# Patient Record
Sex: Male | Born: 1935 | Race: White | Hispanic: No | Marital: Married | Smoking: Former smoker
Health system: Southern US, Community
[De-identification: ages and names within clinical notes are randomized; demographics above are authoritative.]

## PROBLEM LIST (undated history)

## (undated) DIAGNOSIS — I420 Dilated cardiomyopathy: Secondary | ICD-10-CM

## (undated) DIAGNOSIS — C801 Malignant (primary) neoplasm, unspecified: Secondary | ICD-10-CM

## (undated) DIAGNOSIS — E039 Hypothyroidism, unspecified: Secondary | ICD-10-CM

## (undated) DIAGNOSIS — I493 Ventricular premature depolarization: Secondary | ICD-10-CM

## (undated) DIAGNOSIS — N200 Calculus of kidney: Secondary | ICD-10-CM

## (undated) DIAGNOSIS — E785 Hyperlipidemia, unspecified: Secondary | ICD-10-CM

## (undated) DIAGNOSIS — Z7901 Long term (current) use of anticoagulants: Secondary | ICD-10-CM

## (undated) DIAGNOSIS — J449 Chronic obstructive pulmonary disease, unspecified: Secondary | ICD-10-CM

## (undated) DIAGNOSIS — I482 Chronic atrial fibrillation, unspecified: Secondary | ICD-10-CM

## (undated) DIAGNOSIS — I509 Heart failure, unspecified: Secondary | ICD-10-CM

## (undated) DIAGNOSIS — R0602 Shortness of breath: Secondary | ICD-10-CM

## (undated) HISTORY — DX: Hyperlipidemia, unspecified: E78.5

## (undated) HISTORY — DX: Dilated cardiomyopathy: I42.0

## (undated) HISTORY — PX: APPENDECTOMY: SHX54

## (undated) HISTORY — PX: TONSILLECTOMY AND ADENOIDECTOMY: SUR1326

## (undated) HISTORY — DX: Chronic atrial fibrillation, unspecified: I48.20

## (undated) HISTORY — DX: Heart failure, unspecified: I50.9

## (undated) HISTORY — DX: Ventricular premature depolarization: I49.3

## (undated) HISTORY — DX: Chronic obstructive pulmonary disease, unspecified: J44.9

## (undated) HISTORY — DX: Long term (current) use of anticoagulants: Z79.01

## (undated) HISTORY — DX: Calculus of kidney: N20.0

## (undated) HISTORY — PX: MICROLARYNGOSCOPY WITH CO2 LASER AND EXCISION OF VOCAL CORD LESION: SHX5970

## (undated) HISTORY — DX: Hypothyroidism, unspecified: E03.9

## (undated) HISTORY — PX: LITHOTRIPSY: SUR834

---

## 2001-08-24 ENCOUNTER — Inpatient Hospital Stay (HOSPITAL_COMMUNITY): Admission: EM | Admit: 2001-08-24 | Discharge: 2001-08-31 | Payer: Self-pay | Admitting: Emergency Medicine

## 2001-08-24 ENCOUNTER — Encounter: Payer: Self-pay | Admitting: Emergency Medicine

## 2001-08-26 HISTORY — PX: TRANSTHORACIC ECHOCARDIOGRAM: SHX275

## 2001-10-04 ENCOUNTER — Ambulatory Visit (HOSPITAL_COMMUNITY): Admission: RE | Admit: 2001-10-04 | Discharge: 2001-10-04 | Payer: Self-pay | Admitting: Family Medicine

## 2001-10-04 ENCOUNTER — Encounter: Admission: RE | Admit: 2001-10-04 | Discharge: 2001-10-04 | Payer: Self-pay | Admitting: Family Medicine

## 2001-10-04 ENCOUNTER — Encounter: Payer: Self-pay | Admitting: Family Medicine

## 2003-05-23 ENCOUNTER — Emergency Department (HOSPITAL_COMMUNITY): Admission: EM | Admit: 2003-05-23 | Discharge: 2003-05-23 | Payer: Self-pay | Admitting: Emergency Medicine

## 2003-06-06 ENCOUNTER — Emergency Department (HOSPITAL_COMMUNITY): Admission: EM | Admit: 2003-06-06 | Discharge: 2003-06-07 | Payer: Self-pay | Admitting: Emergency Medicine

## 2003-06-07 ENCOUNTER — Encounter: Payer: Self-pay | Admitting: Emergency Medicine

## 2003-09-28 ENCOUNTER — Encounter: Admission: RE | Admit: 2003-09-28 | Discharge: 2003-09-28 | Payer: Self-pay | Admitting: *Deleted

## 2003-09-29 ENCOUNTER — Ambulatory Visit (HOSPITAL_BASED_OUTPATIENT_CLINIC_OR_DEPARTMENT_OTHER): Admission: RE | Admit: 2003-09-29 | Discharge: 2003-09-29 | Payer: Self-pay | Admitting: *Deleted

## 2003-09-29 ENCOUNTER — Ambulatory Visit (HOSPITAL_COMMUNITY): Admission: RE | Admit: 2003-09-29 | Discharge: 2003-09-29 | Payer: Self-pay | Admitting: *Deleted

## 2003-09-29 ENCOUNTER — Encounter (INDEPENDENT_AMBULATORY_CARE_PROVIDER_SITE_OTHER): Payer: Self-pay | Admitting: *Deleted

## 2003-10-27 ENCOUNTER — Ambulatory Visit: Admission: RE | Admit: 2003-10-27 | Discharge: 2003-12-19 | Payer: Self-pay | Admitting: Radiation Oncology

## 2004-01-08 ENCOUNTER — Ambulatory Visit: Admission: RE | Admit: 2004-01-08 | Discharge: 2004-01-08 | Payer: Self-pay | Admitting: Radiation Oncology

## 2004-03-12 ENCOUNTER — Ambulatory Visit: Admission: RE | Admit: 2004-03-12 | Discharge: 2004-03-12 | Payer: Self-pay | Admitting: Radiation Oncology

## 2004-07-16 ENCOUNTER — Ambulatory Visit: Admission: RE | Admit: 2004-07-16 | Discharge: 2004-07-16 | Payer: Self-pay | Admitting: Radiation Oncology

## 2004-10-15 ENCOUNTER — Ambulatory Visit: Admission: RE | Admit: 2004-10-15 | Discharge: 2004-10-15 | Payer: Self-pay

## 2005-01-14 ENCOUNTER — Ambulatory Visit: Admission: RE | Admit: 2005-01-14 | Discharge: 2005-01-14 | Payer: Self-pay | Admitting: Radiation Oncology

## 2005-11-16 ENCOUNTER — Inpatient Hospital Stay (HOSPITAL_COMMUNITY): Admission: EM | Admit: 2005-11-16 | Discharge: 2005-11-18 | Payer: Self-pay | Admitting: Emergency Medicine

## 2005-11-17 ENCOUNTER — Ambulatory Visit: Payer: Self-pay | Admitting: Family Medicine

## 2006-03-18 ENCOUNTER — Ambulatory Visit (HOSPITAL_COMMUNITY): Admission: RE | Admit: 2006-03-18 | Discharge: 2006-03-18 | Payer: Self-pay | Admitting: Radiation Oncology

## 2007-02-16 HISTORY — PX: US ECHOCARDIOGRAPHY: HXRAD669

## 2007-02-18 HISTORY — PX: CARDIOVASCULAR STRESS TEST: SHX262

## 2007-02-26 ENCOUNTER — Inpatient Hospital Stay (HOSPITAL_BASED_OUTPATIENT_CLINIC_OR_DEPARTMENT_OTHER): Admission: RE | Admit: 2007-02-26 | Discharge: 2007-02-26 | Payer: Self-pay | Admitting: Cardiology

## 2007-02-26 HISTORY — PX: CARDIAC CATHETERIZATION: SHX172

## 2010-05-27 ENCOUNTER — Ambulatory Visit: Payer: Self-pay | Admitting: Cardiovascular Disease

## 2010-07-01 ENCOUNTER — Ambulatory Visit: Payer: Self-pay | Admitting: Cardiology

## 2010-07-08 ENCOUNTER — Ambulatory Visit: Payer: Self-pay | Admitting: Cardiology

## 2010-07-08 ENCOUNTER — Ambulatory Visit (HOSPITAL_COMMUNITY): Admission: RE | Admit: 2010-07-08 | Discharge: 2010-07-08 | Payer: Self-pay | Admitting: Cardiology

## 2010-07-08 HISTORY — PX: US ECHOCARDIOGRAPHY: HXRAD669

## 2010-07-29 ENCOUNTER — Ambulatory Visit: Payer: Self-pay | Admitting: Cardiology

## 2010-08-26 ENCOUNTER — Ambulatory Visit: Payer: Self-pay | Admitting: Cardiology

## 2010-09-23 ENCOUNTER — Ambulatory Visit: Payer: Self-pay | Admitting: Cardiology

## 2010-10-24 ENCOUNTER — Ambulatory Visit: Payer: Self-pay | Admitting: Cardiovascular Disease

## 2010-11-25 ENCOUNTER — Encounter (INDEPENDENT_AMBULATORY_CARE_PROVIDER_SITE_OTHER): Payer: Medicare Other

## 2010-11-25 DIAGNOSIS — I4891 Unspecified atrial fibrillation: Secondary | ICD-10-CM

## 2010-11-25 DIAGNOSIS — Z7901 Long term (current) use of anticoagulants: Secondary | ICD-10-CM

## 2010-12-23 ENCOUNTER — Other Ambulatory Visit (INDEPENDENT_AMBULATORY_CARE_PROVIDER_SITE_OTHER): Payer: Medicare Other

## 2010-12-23 DIAGNOSIS — Z7901 Long term (current) use of anticoagulants: Secondary | ICD-10-CM

## 2010-12-23 DIAGNOSIS — I4891 Unspecified atrial fibrillation: Secondary | ICD-10-CM

## 2011-01-06 ENCOUNTER — Ambulatory Visit (INDEPENDENT_AMBULATORY_CARE_PROVIDER_SITE_OTHER): Payer: Medicare Other | Admitting: Cardiology

## 2011-01-06 DIAGNOSIS — I428 Other cardiomyopathies: Secondary | ICD-10-CM

## 2011-01-06 DIAGNOSIS — E7800 Pure hypercholesterolemia, unspecified: Secondary | ICD-10-CM

## 2011-01-06 DIAGNOSIS — I4891 Unspecified atrial fibrillation: Secondary | ICD-10-CM

## 2011-01-06 DIAGNOSIS — I4949 Other premature depolarization: Secondary | ICD-10-CM

## 2011-01-09 ENCOUNTER — Telehealth: Payer: Self-pay | Admitting: *Deleted

## 2011-01-09 NOTE — Telephone Encounter (Signed)
Notified of lab results;faxed copy to Dr. Jeannetta Nap

## 2011-01-27 ENCOUNTER — Ambulatory Visit (INDEPENDENT_AMBULATORY_CARE_PROVIDER_SITE_OTHER): Payer: Medicare Other | Admitting: *Deleted

## 2011-01-27 DIAGNOSIS — Z7901 Long term (current) use of anticoagulants: Secondary | ICD-10-CM

## 2011-01-27 DIAGNOSIS — I4891 Unspecified atrial fibrillation: Secondary | ICD-10-CM | POA: Insufficient documentation

## 2011-02-18 ENCOUNTER — Other Ambulatory Visit: Payer: Self-pay | Admitting: Cardiology

## 2011-02-18 NOTE — Telephone Encounter (Signed)
Medication refill

## 2011-02-24 ENCOUNTER — Encounter: Payer: Medicare Other | Admitting: *Deleted

## 2011-02-24 ENCOUNTER — Ambulatory Visit (INDEPENDENT_AMBULATORY_CARE_PROVIDER_SITE_OTHER): Payer: Medicare Other | Admitting: *Deleted

## 2011-02-24 DIAGNOSIS — I4891 Unspecified atrial fibrillation: Secondary | ICD-10-CM

## 2011-02-27 ENCOUNTER — Other Ambulatory Visit: Payer: Self-pay | Admitting: Cardiology

## 2011-02-27 NOTE — Telephone Encounter (Signed)
escribe medication per fax request  

## 2011-03-04 NOTE — Cardiovascular Report (Signed)
NAME:  Logan, [name] NO.:  [id number]   MEDICAL RECORD NO.:  [id number]          PATIENT TYPE:  OIB   LOCATION:  1967                         FACILITY:  [facility]   PHYSICIAN:  Logan [name], M.D.  DATE OF BIRTH:  1924-12-29   DATE OF PROCEDURE:  02/26/2007  DATE OF DISCHARGE:                            CARDIAC CATHETERIZATION   INDICATIONS FOR PROCEDURE:  An 77 year old white male with history of  atrial fibrillation, frequent PVCs, and increased dyspnea.  He had a  Cardiolite study which indicated an inferior wall scar with moderate  left ventricular dysfunction.  Procedure was indicated to evaluate for  potential for ischemic heart disease.   PROCEDURES:  1. Left heart catheterization.  2. Coronary and left ventricular angiography.   ACCESS:  Via right femoral artery using standard Seldinger technique.   MEDICATIONS:  Local anesthesia 1% Xylocaine.   EQUIPMENT:  A 4-French 4-cm left Judkins catheter, a 4-French 3-D RC  catheter, a 4-French pigtail catheter, a 4-French arterial sheath   CONTRAST:  Omnipaque 90 mL.   HEMODYNAMIC DATA:  Aortic pressures 103/66 with a mean of 83, left  ventricular pressures 103, with an EDP of approximately 15-mmHg.   ANGIOGRAPHIC DATA:  The left coronary artery arises and distributes  normally.  The left main coronary artery is normal.   The left anterior descending artery has minor wall irregularities less  than or equal to 10%.   The left circumflex coronary artery has minor irregularities less than  10-15%.   The right coronary artery is a large dominant vessel.  It has a focal  20% stenosis in the mid vessel.  Otherwise, no significant disease.   LEFT VENTRICULAR ANGIOGRAPHY:  Was performed in RAO view.  This  demonstrates mild left ventricular enlargement with global hypokinesia  and overall moderate left ventricular dysfunction.  Ejection fraction is  estimated at 35-40%.  There is minimal mitral  insufficiency.   FINAL INTERPRETATION:  1. Minimal nonobstructive atherosclerotic coronary artery disease.  2. Moderate left ventricular dysfunction.   PLAN:  Recommend continued medical therapy.           ______________________________  Logan [name], M.D.     PMJ/MEDQ  D:  02/26/2007  T:  02/26/2007  Job:  [id number]   cc:   Logan [name], M.D.

## 2011-03-04 NOTE — H&P (Signed)
NAME:  Logan [name], BEIGEL NO.:  [id number]   MEDICAL RECORD NO.:  [id number]          PATIENT TYPE:  EMS   LOCATION:  MINO                         FACILITY:  [facility]   PHYSICIAN:  Peter M. [place], M.D.  DATE OF BIRTH:  02/24/1925   DATE OF ADMISSION:  02/26/2007  DATE OF DISCHARGE:                              HISTORY & PHYSICAL   HISTORY OF PRESENT ILLNESS:  Mr. Hartsell is a pleasant 77 year old white  male.  He has a history of dilated cardiomyopathy and atrial  fibrillation.  He has been on chronic anticoagulation therapy.  Recently, he presented with symptoms of increasing shortness of breath  on exertion relieved with rest.  He denied any true chest pain,  palpitations or dizziness.  We obtained an echocardiogram which  demonstrated an ejection fraction of 30 to 35%.  He had mild concentric  LVH.  There was global hypokinesia and mild biatrial enlargement.  There  was no significant valvular abnormality.  We subsequently did an  adenosine-Cardiolite study.  This demonstrated ejection fraction of 40%  with global hypokinesia.  He had a fixed inferior wall defect consistent  with prior myocardial infarction.  Given the concern over ischemic heart  disease, he is now admitted for cardiac catheterization.   PAST MEDICAL HISTORY:  1. Atrial fibrillation.  2. Dilated cardiomyopathy.  3. Chronic PVCs.  4. Chronic anticoagulation.  5. Hypothyroidism.  6. Hypercholesterolemia.  7. History of congestive heart failure due to systolic dysfunction.  8. The patient also has a remote history of kidney stones.  9. He has had prior appendectomy and T&A.   ALLERGIES:  HE HAS NO KNOWN ALLERGIES.   CURRENT MEDICATIONS INCLUDE:  1. Coumadin 4.5 mg 6 days a week, 3 mg 1 day a week.  2. Lasix 20 mg per day.  3. Levothyroxine 75 mcg per day.  4. Diovan 160 mg per day.  5. Toprol XL 50 mg per day.  6. Simvastatin 80 mg per day.   SOCIAL HISTORY:  The patient is a retired  Personnel officer.  He is married.  He has no children.  He denies tobacco or alcohol use.   FAMILY HISTORY:  Father died of myocardial infarction and congestive  heart failure in his 59s.  Mother died of a stroke and seizure at age  26.  One brother died of myocardial infarction age 54.   REVIEW OF SYSTEMS:  Otherwise unremarkable.   PHYSICAL EXAMINATION:  GENERAL:  The patient is a pleasant white male in  no apparent distress.  VITAL SIGNS:  His weight is 156, blood pressure is 112/70, pulse is 104  and irregular.  HEENT:  He is normocephalic, atraumatic.  Pupils equal, round, reactive  to light and accommodation.  Oropharynx is clear.  Neck is supple  without JVD, adenopathy, thyromegaly or bruits.  LUNGS: Clear to auscultation and percussion.  CARDIAC:  Exam is without gallop, murmur, rub, or click.  ABDOMEN:  Soft and nontender.  EXTREMITIES:  He has no lower extremity edema.   LABORATORY DATA:  His BUN is 14, creatinine 0.9, electrolytes are  normal.  CBC is  normal.   IMPRESSION:  1. Dilated cardiomyopathy with chronic systolic congestive heart      failure.  2. Abnormal adenosine-Cardiolite study indicating prior inferior      myocardial infarction.  3. Chronic atrial fibrillation.  4. Anticoagulation.  5. Hypertension.  6. Hyperlipidemia.   PLAN:  We will proceed with diagnostic heart catheterization.           ______________________________  Peter M. [place], M.D.     PMJ/MEDQ  D:  02/25/2007  T:  02/25/2007  Job:  [id number]   cc:   Windle Guard, M.D.

## 2011-03-06 ENCOUNTER — Other Ambulatory Visit: Payer: Self-pay | Admitting: Cardiology

## 2011-03-06 NOTE — Telephone Encounter (Signed)
escribe medication per fax request  

## 2011-03-07 NOTE — Discharge Summary (Signed)
[place]. [facility]  Patient:    Logan [name], ETSITTY Visit Number: [id number] MRN: [id number]          Service Type: MED Location: [phone] Attending Physician:  [place], Peter Manning Dictated by:   Peter M. [place], M.D. Admit Date:  08/24/2001 Discharge Date: 08/31/2001   CC:         Buren Kos, M.D.   Discharge Summary  HISTORY OF PRESENT ILLNESS:  The patient is a 77 year old white male previously in good health who presented with a two-week history of worsening shortness of breath on exertion.  On presentation he was found to be in atrial fibrillation with a rapid ventricular response.  The patient did have a remote history of pneumonia.  Also had a remote history of heavy tobacco use.  The patient was admitted for further evaluation and management.  For details of his past medical history, social history, family history, and physical exam please see admission history and physical.  LABORATORY DATA:  pH 7.40, pCO2 36, pO2 76, bicarb of 23 on room air.  White count 8800, hemoglobin 13.9, hematocrit 40.4, platelet count 267,000, normal differential.  PT is 14.2 with an INR of 1.1, PTT 32.  Sodium 138, potassium 4.4, chloride 110, CO2 23, glucose 118, BUN 14, creatinine 0.8.  Other chemistries were normal including hepatic function profile.  CK was 35 with negative MB, troponin 0.01.  In fact, serial cardiac enzymes were normal. Lipid panel showed a total cholesterol of 191, HDL 41, LDL 134, triglycerides of 80.  TSH was 1.446.  Chest x-ray showed changes of COPD, heart size was enlarged, and there was mild bibasilar edema and effusions.  ECG showed atrial fibrillation with rapid ventricular response.  There was low voltage otherwise no acute changes.  HOSPITAL COURSE:  The patient was admitted.  He was heparinized.  Rate controls obtained initially with IV Cardizem but then the patient was transitioned to an oral beta blocker of Toprol XL  which was increased to 100 mg per day.  This yielded adequate rate control with heart rates consistently under 100.  The patient was also started on Coumadin.  He was gently diuresed with improvement in his dyspnea.  Echocardiogram was obtained and was consistent with dilated cardiomyopathy.  There was mild biatrial enlargement, severe global hypokinesia with ejection fraction estimated at 25-30%.  There was mild mitral and tricuspid insufficiency.  In addition, the patient was started on ACE inhibitor.  He was initiated on amiodarone beginning at 400 mg b.i.d.  He was monitored in the hospital for drug initiation.  He tolerated this well over his four days of observation while on amiodarone.  Baseline pulmonary function studies were performed and demonstrate severe obstructive pulmonary disease.  FEV-1 was 0.92 L which was 36% of predicted with an FEV-1 to FVC ratio of 34 which was 52% of predicted. Diffusion capacity was moderately depressed.  With adequate rate control, the patient had his Coumadin adjusted to therapeutic levels at which point his heparin was discontinued.  He had good compensation of his congestive heart failure.  He was discharged home in stable condition on continued amiodarone therapy.  The plan would be to have the patient remain in atrial fibrillation to perform an outpatient elective cardioversion in approximately three weeks.  DISCHARGE DIAGNOSES: 1. Atrial fibrillation/rapid ventricular response. 2. Congestive heart failure. 3. Dilated cardiomyopathy. 4. Severe chronic obstructive pulmonary disease. 5. History of pneumonia.  DISCHARGE MEDICATIONS: 1. Amiodarone 200 mg two tablets twice  a day for approximately two to    three weeks and then we will reduce to 400 mg per day. 2. Coumadin 3 mg daily. 3. Lasix 20 mg per day. 4. Altace 5 mg daily. 5. Toprol XL 100 mg per day.  ACTIVITY:  The patient is to advance his activity as tolerated.  DIET:  He is  instructed low salt, low fat diet and avoidance of foods rich in vitamin K.  FOLLOWUP:  The patient will return Friday, September 03, 2001 for a prothrombin time.  He will see Dr. Swaziland for office visit 10 days.  DISCHARGE STATUS:  Improved. Dictated by:   Peter M. [place], M.D. Attending Physician:  [place], Peter Manning DD:  08/31/01 TD:  08/31/01 Job: [id number]

## 2011-03-07 NOTE — H&P (Signed)
NAME:  Logan [name], Logan [name] NO.:  [id number]   MEDICAL RECORD NO.:  [id number]          PATIENT TYPE:  INP   LOCATION:  1830                         FACILITY:  [facility]   PHYSICIAN:  Pearlean Brownie, M.D.DATE OF BIRTH:  March 25, 1925   DATE OF ADMISSION:  11/16/2005  DATE OF DISCHARGE:                                HISTORY & PHYSICAL   PRIMARY CARE PHYSICIAN:  Windle Guard, M.D.  Cardiologist, Dr. [place].   CHIEF COMPLAINT:  Vomiting and diarrhea.   HISTORY OF PRESENT ILLNESS:  Mr. Mounts is a 77 year old male with past  medical history significant for vocal cord cancer, atrial fibrillation, CHF  with ejection fraction 25-30%, COPD and hypothyroidism who presents with a 3-  day history of nonbloody, nonbilious vomiting.  The patient states he has  not been able to keep anything down.  He also has had watery diarrhea  several times a day during this same time frame.  He has had mild mid  epigastric pain which began after the vomiting started.  He also has noted  occasional subjective fevers.  He denies any recent antibiotic use.  He has  no sick contacts, although his wife developed diarrhea and vomiting 2 days  after his illness began.  In regards to the patient's COPD, he has a chronic  cough, but denies any recent changes in his dyspnea or sputum production.  He usually is able to ambulate without dyspnea, but gets short of breath  with any further exertion secondary to CHF.  The patient also has felt  increasingly weak as the illness has progressed.   REVIEW OF SYSTEMS:  CARDIOPULMONARY:  No chest pain.  HEENT:  Slight  headache, no vision changes, no lightheadedness. no dizziness.  GENITOURINARY:  No dysuria.  Remainder as per HPI.   PAST MEDICAL HISTORY:  1.  Vocal cord cancer, status post surgery and radiation.  2.  Congestive heart failure.  3.  Dilated cardiomyopathy with ejection fraction 25-30% in 2002.  4.  COPD.  5.  Kidney stones.  6.  Atrial  fibrillation.  7.  Hypothyroidism.  8.  Status post appendectomy.  9.  Status post T&A.   MEDICATIONS:  1.  Amiodarone 100 mg p.o. daily.  2.  Warfarin 3 mg on Monday, Wednesday and Friday; 4.5 mg on other days of      the week.  3.  Lasix 20 mg p.o. daily.  4.  Diovan 160 mg p.o. b.i.d.  5.  Levothyroxine 75 mcg p.o. daily.   ALLERGIES:  No known drug allergies.   FAMILY HISTORY:  Dad died of a heart attack and CHF when he was in his 51s.  Mom died of CVA and seizure at age 76.  Brother died of MI at age 64.  He  does have one sister in good health.  He has no children.   SOCIAL HISTORY:  He is a 20- to 30-pack-year smoker, although he quit more  than 20 years ago.  No alcohol use.  No illicit drug use.  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   PHYSICAL EXAMINATION:  VITAL SIGNS:  Temperature 98.3, pulse 68, blood  pressure 94/53, respirations 24.  He is 99% on 2 L, 96% on room air.  GENERAL:  He is resting in no acute distress.  HEENT:  Pupils equal round and reactive to light and accommodation.  Sclerae  anicteric.  Extraocular movements intact.  Mucous membranes are dry.  Throat  is without erythema or exudate.  Tympanic membranes are clear bilaterally.  NECK:  No carotid bruits or thyromegaly.  LUNGS:  Diffuse expiratory wheezes.  He has decreased breath sound  bilaterally and he has positive crackles over his left mid lung field.  CARDIAC:  Distant heart sounds, but he has regular rate and rhythm.  ABDOMEN:  Hyperactive bowel sounds.  Voluntary guarding in the mid  epigastric region.  No rebound tenderness and no hepatosplenomegaly.  EXTREMITIES:  2+ radial and dorsalis pedis pulses bilaterally.  No clubbing,  cyanosis or edema.  NEUROLOGIC:  He is alert and oriented x3.  Cranial nerves 2-12 grossly  intact.  He has 5/5 motor strength and 2+ DTRs which are bilaterally  symmetric.  Finger-to-nose was intact.  His gait was not tested.  SKIN:  He has poor skin  turgor, but no rashes.  RECTAL:  Good sphincter tone.  Prostate is mildly enlarged.  Fecal occult  blood negative.   LABORATORY DATA AND X-RAY FINDINGS:  White blood cell count 11.6 with 88%  neutrophils, hemoglobin 13.5, hematocrit 39.3, platelet count 275.  Sodium  136, potassium 3.7, chloride 104, bicarb 21, BUN 55, creatinine 1.9, glucose  134.  Total protein 6.0, albumin 3.0, amylase 62, lipase 20, AST 34, ALT 18,  Alk phos 64, total bilirubin 0.9.  Urinalysis with specific gravity 1.024,  negative ketones, negative bilirubin, negative hemoglobin, negative nitrite,  negative leukocyte esterase, 0-2 wbc's and 0-2 rbc's.  PT was 33.2, INR 3.3.   Chest x-ray showed COPD as well as probable acute pneumonia, left mid lung,  as well as a nodular density in right lung base that needs follow up.  Abdominal x-ray showed mild ileus without obstruction.   ASSESSMENT/PLAN:  1.  He is a 76 year old male with vomiting and diarrhea.  Likely viral      gastroenteritis given his history and his wife with similar illness.      Will check an electrocardiogram to make sure there is no inferior wall      myocardial infarction given his age.  Will give Protonix 40 mg      intravenous daily as well as Phenergan 25 mg intravenous q.6h. p.r.n. if      his vomiting continues.  2.  Left middle lobe pneumonia, community-acquired.  Will start ceftriaxone      and azithromycin to cover Streptococcus pneumonia and atypicals.  3.  Chronic obstructive pulmonary disease.  Will start Xopenex nebulizer      treatment.  The patient is not on any chronic obstructive pulmonary      disease medications at home and would recommend to follow up as      outpatient so he can be put on regular chronic obstructive pulmonary      disease medications such as Spiriva.  4.  Acute renal failure likely secondary to vomiting and dehydration.  We     will check a FENA.  Will follow his creatinine.  He is without urinary      tract  infection or obstruction.  Will hold his Diovan and Lasix for now      until  his renal function improves.  The patient denies any nonsteroidal      anti-inflammatory drug use.  5.  Dehydration/fluids, electrolytes.  The patient received 500 mL bolus in      the emergency room.  Will give another 250 mL bolus of normal saline and      then will run intravenous fluids at 7500 mL an hour.  Will watch his      urine output closely as well as his lung exam given his history of      congestive heart failure.  6.  Atrial fibrillation.  The patient is rate-controlled with his heart rate      currently in the 60s.  We will hold his amiodarone until he is taking      better oral intake.  The patient's international normalized ratio was      elevated at 3.3, so we will hold his Coumadin today and then pharmacy      will dose his Coumadin.  7.  Hypotension.  This is likely secondary to vomiting and dehydration.  We      will rehydrate.  Orthostatic vital signs were still pending at the time      of this dictation, but we will follow up on that.  8.  Solitary pulmonary nodule.  The patient will need a followup computed      tomography or chest x-ray as an outpatient.  The patient with increased      age and smoking history which does put him at increased risk of lung      cancer.  9.  Code status.  The patient is a full code.      Benn Moulder, M.D.    ______________________________  Pearlean Brownie, M.D.    MR/MEDQ  D:  11/16/2005  T:  11/16/2005  Job:  [id number]

## 2011-03-07 NOTE — Op Note (Signed)
NAME:  FORBES, LOLL NO.:  [id number]   MEDICAL RECORD NO.:  [id number]                   PATIENT TYPE:  AMB   LOCATION:  DSC                                  FACILITY:  [facility]   PHYSICIAN:  Kathy Breach, M.D.                   DATE OF BIRTH:  06/14/1925   DATE OF PROCEDURE:  09/29/2003  DATE OF DISCHARGE:                                 OPERATIVE REPORT   PREOPERATIVE DIAGNOSIS:  Right vocal cord lesion, rule out carcinoma.   PROCEDURE:  Micro direct laryngoscopy with complete stripping of right vocal  cord lesion, suspect for carcinoma.   POSTOPERATIVE DIAGNOSIS:  Pending histological confirmation.   DESCRIPTION OF PROCEDURE:  With the patient under general orotracheal  anesthesia, the laryngoscope was introduced.  Both valleculae were clear.  Epiglottis was normal.  Both piriform sinuses were clear.  The anterior  larynx was normal.  Aside from lesion involving the right true vocal cord  from just anterior to the vocal process and all the way to the anterior  commissure, there was a mucosally covered mass cratered on its medial  surface, but not rightly ulcerated.  It was firm to touch.  Perioperatively,  mirror examination and fiberoptic examination, the patient demonstrated  normal mobility of this right vocal cord.  Under microscopic visualization,  preprocedure photo was obtained of the lesion.  The lesion was stripped away  as completely as possible.  This appeared to be mainly attached on the true  vocal cord and onto the undersurface extending 3 or 4 mm underside of the  right true vocal cord.  As stated, the lesion removed and as completely as  possible.  It came away with difficulty.  All suggestions clinically of  being invasive carcinoma.  The patient tolerated the procedure well and was  taken to the recovery room in stable general condition.                                               Kathy Breach, M.D.    Venia Minks  D:   09/29/2003  T:  09/30/2003  Job:  [id number]

## 2011-03-07 NOTE — H&P (Signed)
[place]. [facility]  Patient:    Logan [name], Logan [name] Visit Number: [id number] MRN: [id number]          Service Type: MED Location: [phone] Attending Physician:  [place], Peter Manning Dictated by:   Peter M. [place], M.D. Admit Date:  08/24/2001   CC:         Windle Guard, M.D.   History and Physical  HISTORY OF PRESENT ILLNESS:  [name] is a 77 year old white male, previously in good health, who presents with a two-week history of worsening shortness of breath on exertion.  He has had no chest pain, no orthopnea, PND, or edema.  He has had no cough or fever.  He denies any palpitations or dizziness.  The patient states this [place].  Now he is at the point where he can no longer walk out to his road without having to stop twice due to shortness of breath.  On arrival to the emergency room the patient was found to be in atrial fibrillation with a rapid ventricular response.  PAST MEDICAL HISTORY: 1. Pneumonia x 2. 2. Previous kidney stones, with surgery in 1967. 3. Prior appendectomy. 4. Prior T&A.  MEDICATIONS:  None.  ALLERGIES:  No known allergies.  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  He has no children.  He is a nonsmoker and does not drink alcohol.  FAMILY HISTORY:  Father died of myocardial infarction and congestive heart failure in his 36s.  Mother died of CVA and seizure at age 59.  He has a brother who died of myocardial infarction at age 13.  REVIEW OF SYSTEMS:  The patient did have an irregular pulse noted five to six years ago, where ECG just showed "skipped beats."  He has had recent fatigue and some intermittent right upper quadrant discomfort.  All other review of systems are negative.  PHYSICAL EXAMINATION:  GENERAL:  Elderly white male in no apparent distress.  VITAL SIGNS:  Blood pressure 150/100, pulse 140 and irregular,  respirations 20, afebrile.  HEENT:  Normocephalic, atraumatic.  Pupils are equal, round, and reactive to light and accommodation.  Fundi are benign.  Oropharynx is clear.  NECK:  Supple.  Without JVD, adenopathy, thyromegaly, or bruits.  LUNGS:  Clear to auscultation and percussion.  CARDIAC:  Irregular rate and rhythm, without gallops, murmurs, rubs, or clicks.  ABDOMEN:  Soft and nontender.  No hepatosplenomegaly, masses, or bruits.  EXTREMITIES:  Femoral and pedal pulses are 2+ and symmetric.  He has no edema or cyanosis.  NEUROLOGIC:  Alert and oriented x 4.  Cranial nerves II-XII are intact grossly.  Motor and sensory exam are normal.  Mood is appropriate.  LABORATORY DATA:  ECG shows atrial fibrillation with rapid ventricular response and frequent PVCs.  There are nonspecific T-wave abnormalities.  Chest x-ray shows cardiomegaly, small right pleural effusion.  Question early CHF.  White count 8800, hemoglobin 13.9, hematocrit 40.4, platelets 267,000.  Sodium 138, potassium 4.4, chloride 110, CO2 23, BUN 14, creatinine 0.8, glucose 118. LFTs are normal.  Troponin is 0.01, CK 35, negative MB.  IMPRESSION:  1. Atrial fibrillation with rapid ventricular response, new onset. 2. Mild CHF secondary to #1. 3. History of pneumonia.  PLAN:  The patient will be admitted to telemetry.  Rule out myocardial infarction.  Obtain TSH, lipid panel, and echocardiogram.  Treat with IV heparin, IV Cardizem for rate  control.  Will also start him on aspirin and beta blocker and gently diurese. Dictated by:   Peter M. [place], M.D. Attending Physician:  [place], Peter Manning DD:  08/24/01 TD:  08/25/01 Job: [id number]

## 2011-03-07 NOTE — Discharge Summary (Signed)
NAME:  Logan [name], Logan [name] NO.:  [id number]   MEDICAL RECORD NO.:  [id number]          PATIENT TYPE:  INP   LOCATION:  6705                         FACILITY:  [facility]   PHYSICIAN:  Benn Moulder, M.D.      DATE OF BIRTH:  February 08, 1925   DATE OF ADMISSION:  11/16/2005  DATE OF DISCHARGE:  11/18/2005                           DISCHARGE SUMMARY - REFERRING   SPECIAL INSTRUCTIONS:  Please send a STAT discharge summary to Dr. Jeannetta Nap at  fax number [phone], as the patient has an appointment tomorrow morning at  11:00 a.m. and I would like for Dr. Jeannetta Nap to have a copy of this discharge  summary for that appointment.   DISCHARGE DIAGNOSES:  1.  Viral gastroenteritis.  2.  Community acquired pneumonia.  3.  Solitary pulmonary nodule.  4.  Congestive heart failure.  5.  Chronic obstructive pulmonary disease.  6.  Acute renal failure.  7.  Hypothyroidism.  8.  Bradycardia.   DISCHARGE MEDICATIONS:  1.  Augmentin 875 mg p.o. b.i.d. x7 days.  2.  Lasix 20 mg p.o. daily.  3.  Levothyroxine 75 mcg p.o. daily.  4.  Coumadin. The patient will resume once INR normalizes.  5.  Albuterol MDI 1 to 2 puffs q.4 hours p.r.n.  6.  Diovan 160 mg p.o. b.i.d. Will be held until after the patient has a CT      scan as an outpatient.  7.  Amiodarone was discontinued per cardiology.   PROCEDURES:  1.  A chest x-ray on November 16, 2005 showed COPD with acute mid left lung      pneumonia. Additional nodular density of the right lung base, which      required bi-lobe imaging, was also noted.  2.  Abdominal x-ray on November 16, 2005 showed a mild ileus pattern without      obstruction.  3.  EKG showed sinus bradycardia and a pattern of ventricular bigeminy.   LABORATORY DATA:  On admission white blood cell count 11.6. Hemoglobin and  hematocrit 13.5 and 39.3. Platelets 275,000. Sodium 136, potassium 3.4,  chloride 104, bicarb 21, BUN elevated at 59, creatinine elevated at 1.9.  Glucose 134.  LFT's were within normal limits. Urinalysis was within normal  limits.   Laboratory data during the hospital admission, cardiac enzymes were negative  x1.   Laboratory data at discharge, creatinine 0.9. The patient's INR was 4.7.   HISTORY OF PRESENT ILLNESS:  The patient is a 77 year old male with past  medical history significant for vocal cord cancer, atrial fibrillation,  congestive heart failure with an ejection fraction of 25% to 30%, COPD, and  hypothyroidism, who presented with a 3 day history of non-bloody, non-  bilious vomiting. He was unable to keep anything down and was admitted for  vomiting and dehydration. The patient was also noted to have a left mid lung  pneumonia on chest x-ray while down in the emergency department.   HOSPITAL COURSE BY PROBLEMS:  #1.  VOMITING AND DEHYDRATION:  The patient  was given supportive care with 1 liter of normal saline as well as IV fluids  overnight during his first night in the hospital. The patient's vomiting and  diarrhea was felt likely secondary to a viral gastroenteritis. The patient  experienced no vomiting or further diarrhea while he was in the hospital and  at the time of discharge, was tolerating a regular diet. The patient had an  EKG and cardiac enzymes. The cardiac enzymes were normal and the EKG was  unchanged, so patient unlikely to have MI as cause of vomiting.   #2.  COMMUNITY ACQUIRED PNEUMONIA:  The patient was afebrile throughout the  hospital stay. The patient was started on ceftriaxone and Azithromycin and  was changed to oral Augmentin 750 mg p.o. b.i.d. prior to discharge. The  patient will complete a 7 day course of the Augmentin.   #3.  CHRONIC OBSTRUCTIVE PULMONARY DISEASE:  The patient with diffuse  wheezing on examination. The patient did require oxygen during his first  night in the hospital to maintain his O2 saturations at 92%. At the time of  discharge, the patient was on room air and O2 saturations  were 90% to 92%  while ambulating without oxygen. The patient was given albuterol 4 times a  day while he was in the hospital and will be discharged with an albuterol  MDI to use as needed for shortness of breath.   #4.  BRADYCARDIA:  The patient was noted to have asymptomatic bradycardia  with his heart rate down in the 40's during his hospital stay. I talked on  the phone with the patient's cardiologist, Dr. Sw[place]. We decided to  discontinue the patient's amiodarone, as this could be a possible cause of  his bradycardia. The patient has normal sinus rhythm and not in atrial  fibrillation during this hospital stay. The patient will followup with  cardiology in 3 to 4 weeks. He will call for an appointment.   #5.  CONGESTIVE HEART FAILURE:  With an ejection fraction of 25% TO 30%. The  patient's Lasix was initially held while he was dehydrated. This was  restarted once he was tolerating a normal diet. The patient's ace inhibitor  was held, as his blood pressure was 90 systolic on admission, again likely  secondary to dehydration. We will continue to hold the ace inhibitor until  after he has his lung CT scan.   #6.  ATRIAL FIBRILLATION:  As mentioned, the patient was in normal sinus  rhythm during the hospital stay and his amiodarone was discontinued  secondary to bradycardia. The patient's INR was elevated during his hospital  stay and was 4.0 on the day after admission and then 4.7 on the day of  discharge. Increased INR likely due to decreased p.o. intake and possibly  due to interaction with ceftriaxone and Azithromycin. The patient was  instructed to have his INR checked on May 19, 2006 at his appointment with  Dr. Jeannetta Nap and will need close followup of his INR in order to restart his  Coumadin once his INR returns to therapeutic range.   #7.  PULMONARY LUNG NODULE:  The patient needs outpatient CT scan later this week. The CT scan will be done later as an outpatient. Given  patient's  smoking history as well as age, he is at risk of lung cancer.   #8.  ACUTE RENAL FAILURE:  The patient's creatinine decreased to 1.2 and  then was 0.9 on the day of discharge, after being elevated at 1.9 on  admission. This was likely pre-renal secondary to dehydration from his vital  illness.  FOLLOW UP:  1.  Dr. Windle Guard. Phone number [phone], on November 19, 2005 at 11:00      a.m.  2.  Dr. Peter Sw[place], Cardiology. Phone number [phone]. The patient is to      followup in 3 to 4 weeks for followup of his bradycardia.   FOLLOW-UP ISSUES:  1.  Solitary lung nodule. The patient needs CT scan as an outpatient later      this week.  2.  Supra-therapeutic INR. Coumadin being held. This will need to be      restarted once his INR is within normal range.  3.  Ace inhibitor was held for renal protection. Due to increased      creatinine, will be held until after CT scan for renal protection. This      does need to be restarted at a later time.      Benn Moulder, M.D.     MR/MEDQ  D:  11/18/2005  T:  11/18/2005  Job:  [id number]   cc:   Windle Guard, M.D.  Fax: [phone]   Peter M. Swaziland, M.D.  Fax: [phone]

## 2011-03-07 NOTE — Discharge Summary (Signed)
NAME:  Logan [name], Logan [name] NO.:  [id number]   MEDICAL RECORD NO.:  [id number]          PATIENT TYPE:  INP   LOCATION:  6705                         FACILITY:  [facility]   PHYSICIAN:  Pearlean Brownie, M.D.DATE OF BIRTH:  [birth year]   DATE OF ADMISSION:  11/16/2005  DATE OF DISCHARGE:  11/18/2005                                 DISCHARGE SUMMARY   ADDENDUM:  job #[id number]   Patient had his oxygenation saturation checked just prior to leaving.  Patient was noted to have an oxygen saturation of 82% while ambulating.  As  a result, the patient qualified for home oxygen and will be discharged with  home oxygen.      Benn Moulder, M.D.    ______________________________  Pearlean Brownie, M.D.    MR/MEDQ  D:  11/18/2005  T:  11/18/2005  Job:  [id number]   cc:   Windle Guard, M.D.  Fax: [phone]

## 2011-03-24 ENCOUNTER — Ambulatory Visit (INDEPENDENT_AMBULATORY_CARE_PROVIDER_SITE_OTHER): Payer: Medicare Other | Admitting: *Deleted

## 2011-03-24 DIAGNOSIS — I4891 Unspecified atrial fibrillation: Secondary | ICD-10-CM

## 2011-03-24 LAB — POCT INR: INR: 2.1

## 2011-04-21 ENCOUNTER — Ambulatory Visit (INDEPENDENT_AMBULATORY_CARE_PROVIDER_SITE_OTHER): Payer: Medicare Other | Admitting: *Deleted

## 2011-04-21 ENCOUNTER — Other Ambulatory Visit: Payer: Self-pay | Admitting: Cardiology

## 2011-04-21 DIAGNOSIS — I4891 Unspecified atrial fibrillation: Secondary | ICD-10-CM

## 2011-04-21 NOTE — Telephone Encounter (Signed)
Med refill

## 2011-05-15 ENCOUNTER — Other Ambulatory Visit: Payer: Self-pay | Admitting: Cardiology

## 2011-05-15 NOTE — Telephone Encounter (Signed)
escribe medication per fax request  

## 2011-05-19 ENCOUNTER — Ambulatory Visit (INDEPENDENT_AMBULATORY_CARE_PROVIDER_SITE_OTHER): Payer: Medicare Other | Admitting: *Deleted

## 2011-05-19 DIAGNOSIS — I4891 Unspecified atrial fibrillation: Secondary | ICD-10-CM

## 2011-05-19 LAB — POCT INR: INR: 2.3

## 2011-06-13 ENCOUNTER — Other Ambulatory Visit: Payer: Self-pay | Admitting: Cardiology

## 2011-06-13 NOTE — Telephone Encounter (Signed)
escribe medication per fax request  

## 2011-06-16 ENCOUNTER — Ambulatory Visit (INDEPENDENT_AMBULATORY_CARE_PROVIDER_SITE_OTHER): Payer: Medicare Other | Admitting: *Deleted

## 2011-06-16 DIAGNOSIS — I4891 Unspecified atrial fibrillation: Secondary | ICD-10-CM

## 2011-07-12 ENCOUNTER — Encounter: Payer: Self-pay | Admitting: Cardiology

## 2011-07-14 ENCOUNTER — Ambulatory Visit (INDEPENDENT_AMBULATORY_CARE_PROVIDER_SITE_OTHER): Payer: Medicare Other | Admitting: *Deleted

## 2011-07-14 DIAGNOSIS — I4891 Unspecified atrial fibrillation: Secondary | ICD-10-CM

## 2011-07-14 LAB — POCT INR: INR: 2.5

## 2011-07-28 ENCOUNTER — Encounter: Payer: Self-pay | Admitting: Cardiology

## 2011-07-28 ENCOUNTER — Ambulatory Visit (INDEPENDENT_AMBULATORY_CARE_PROVIDER_SITE_OTHER): Payer: Medicare Other | Admitting: Cardiology

## 2011-07-28 VITALS — BP 116/71 | HR 64 | Ht 70.0 in | Wt 143.4 lb

## 2011-07-28 DIAGNOSIS — I482 Chronic atrial fibrillation, unspecified: Secondary | ICD-10-CM | POA: Insufficient documentation

## 2011-07-28 DIAGNOSIS — I428 Other cardiomyopathies: Secondary | ICD-10-CM

## 2011-07-28 DIAGNOSIS — I509 Heart failure, unspecified: Secondary | ICD-10-CM

## 2011-07-28 DIAGNOSIS — I420 Dilated cardiomyopathy: Secondary | ICD-10-CM

## 2011-07-28 DIAGNOSIS — Z7901 Long term (current) use of anticoagulants: Secondary | ICD-10-CM

## 2011-07-28 DIAGNOSIS — E785 Hyperlipidemia, unspecified: Secondary | ICD-10-CM

## 2011-07-28 DIAGNOSIS — I4891 Unspecified atrial fibrillation: Secondary | ICD-10-CM

## 2011-07-28 DIAGNOSIS — I2510 Atherosclerotic heart disease of native coronary artery without angina pectoris: Secondary | ICD-10-CM

## 2011-07-28 DIAGNOSIS — R05 Cough: Secondary | ICD-10-CM

## 2011-07-28 NOTE — Patient Instructions (Addendum)
Continue your current medications.  If you have a recurrent cough let me know and we will get a Chest Xray.  I will see you again in 6 months with fasting lab work.

## 2011-07-28 NOTE — Assessment & Plan Note (Signed)
In 2008 his ejection fraction was 25-35%. His most recent echocardiogram of one year ago showed improvement to 50-55%. He is asymptomatic. He will continue on digoxin, diuretics, beta blocker, and ARB.

## 2011-07-28 NOTE — Assessment & Plan Note (Signed)
His rate is well controlled and he is asymptomatic. Continue with digoxin and metoprolol.

## 2011-07-28 NOTE — Assessment & Plan Note (Signed)
INR today is therapeutic at 2.4. We will recheck again in 4 weeks.

## 2011-07-28 NOTE — Progress Notes (Signed)
Logan [name] Date of Birth: [birth year]   History of Present Illness: Mr. Pellicane is seen for followup today. He has a history of dilated cardiomyopathy and chronic atrial fibrillation. On followup today he is feeling very well. He did have an episode of significant coughing 2 weeks ago. He reports this cough just wouldn't stop. It lasted 24 hours and seemed to resolve after taking an antihistamine. He denies any further cough. His breathing is doing fairly well. He denies any chest pain, palpitations, or dizziness. Current Outpatient Prescriptions on File Prior to Visit  Medication Sig Dispense Refill  . Calcium-Magnesium-Zinc (CAL-MAG-ZINC PO) Take by mouth daily.        . digoxin (LANOXIN) 0.25 MG tablet TAKE 1 TABLET BY MOUTH EVERY DAY  30 tablet  5  . fish oil-omega-3 fatty acids 1000 MG capsule Take 1 g by mouth daily.        . Flaxseed, Linseed, (FLAX SEED OIL PO) Take by mouth daily.        . furosemide (LASIX) 20 MG tablet TAKE 1 TABLET EVERY DAY  30 tablet  5  . levothyroxine (SYNTHROID, LEVOTHROID) 75 MCG tablet TAKE 1 TABLET BY MOUTH EVERY DAY  30 tablet  5  . metoprolol (LOPRESSOR) 50 MG tablet TAKE 1 TABLET BY MOUTH TWICE A DAY  60 tablet  5  . Multiple Vitamin (MULTIVITAMIN) tablet Take 1 tablet by mouth daily.        . simvastatin (ZOCOR) 80 MG tablet TAKE 1 TABLET EVERY DAY  30 tablet  5  . valsartan (DIOVAN) 160 MG tablet Take 160 mg by mouth daily.        Marland Kitchen warfarin (COUMADIN) 3 MG tablet TAKE 1 AND 1/2 TABLET FOR 6 DAYS THEN 1 TABLET FOR 1 DAY AND REPEAT  45 tablet  5    No Known Allergies  Past Medical History  Diagnosis Date  . Chronic atrial fibrillation   . Dilated cardiomyopathy   . PVC's (premature ventricular contractions)   . Hyperlipidemia   . COPD (chronic obstructive pulmonary disease)   . Hypothyroidism   . Renal calculi   . Chronic anticoagulation   . CHF (congestive heart failure)     DUE TO SYSTOLIC DYSFUNCTION    Past Surgical History    Procedure Date  . Cardiac catheterization 02/26/2007    EF 35-40%  . Appendectomy   . Tonsillectomy and adenoidectomy   . US echocardiography 07/08/2010    EF 50-55%  . US echocardiography 02/16/2007    EF 25-35%  . Transthoracic echocardiogram 08/26/2001    EF 25-30%  . Cardiovascular stress test 02/18/2007    EF 40%    History  Smoking status  . Former Smoker  . Quit date: 07/12/1991  Smokeless tobacco  . Not on file    History  Alcohol Use No    Family History  Problem Relation Age of Onset  . Stroke Mother   . Seizures Mother   . Heart attack Father   . Heart failure Father   . Heart attack Brother     Review of Systems: The review of systems is positive for recent cough.  All other systems were reviewed and are negative.  Physical Exam: BP 116/71  Pulse 64  Ht 5\' 10"  (1.778 m)  Wt 143 lb 6.4 oz (65.046 kg)  BMI 20.58 kg/m2 The patient is alert and oriented x 3.  The mood and affect are normal.  The skin is warm and dry.  Color is normal.  The HEENT exam reveals that the sclera are nonicteric.  The mucous membranes are moist.  The carotids are 2+ without bruits.  There is no thyromegaly.  There is no JVD.  The lungs are clear.  The chest wall is non tender.  The heart exam reveals an irregular rate with a normal S1 and S2.  There are no murmurs, gallops, or rubs.  The PMI is not displaced.   Abdominal exam reveals good bowel sounds.  There is no guarding or rebound.  There is no hepatosplenomegaly or tenderness.  There are no masses.  Exam of the legs reveal no clubbing, cyanosis, or edema.  The legs are without rashes.  The distal pulses are intact.  Cranial nerves II - XII are intact.  Motor and sensory functions are intact.  The gait is normal.  LABORATORY DATA: ECG today demonstrates atrial fibrillation with a rate of 60 beats per minute. It is otherwise normal. Assessment / Plan:

## 2011-07-28 NOTE — Assessment & Plan Note (Signed)
His lab work from March. His lipids were acceptable. We will recheck again in 6 months for his next visit.

## 2011-08-10 ENCOUNTER — Other Ambulatory Visit: Payer: Self-pay | Admitting: Cardiology

## 2011-08-11 ENCOUNTER — Ambulatory Visit (INDEPENDENT_AMBULATORY_CARE_PROVIDER_SITE_OTHER): Payer: Medicare Other | Admitting: *Deleted

## 2011-08-11 DIAGNOSIS — I4891 Unspecified atrial fibrillation: Secondary | ICD-10-CM

## 2011-08-11 LAB — POCT INR: INR: 2.3

## 2011-08-27 ENCOUNTER — Other Ambulatory Visit: Payer: Self-pay | Admitting: Cardiology

## 2011-09-04 ENCOUNTER — Other Ambulatory Visit: Payer: Self-pay | Admitting: Cardiology

## 2011-09-22 ENCOUNTER — Ambulatory Visit (INDEPENDENT_AMBULATORY_CARE_PROVIDER_SITE_OTHER): Payer: Medicare Other | Admitting: *Deleted

## 2011-09-22 DIAGNOSIS — I4891 Unspecified atrial fibrillation: Secondary | ICD-10-CM

## 2011-09-22 LAB — POCT INR: INR: 2.2

## 2011-10-16 ENCOUNTER — Other Ambulatory Visit: Payer: Self-pay | Admitting: *Deleted

## 2011-10-16 MED ORDER — LEVOTHYROXINE SODIUM 75 MCG PO TABS: 75.0000 ug | ORAL_TABLET | Freq: Every day | ORAL | Status: DC

## 2011-11-03 ENCOUNTER — Ambulatory Visit (INDEPENDENT_AMBULATORY_CARE_PROVIDER_SITE_OTHER): Payer: Medicare Other | Admitting: *Deleted

## 2011-11-03 DIAGNOSIS — I4891 Unspecified atrial fibrillation: Secondary | ICD-10-CM

## 2011-11-03 LAB — POCT INR: INR: 2.3

## 2011-12-11 ENCOUNTER — Other Ambulatory Visit: Payer: Self-pay | Admitting: *Deleted

## 2011-12-11 MED ORDER — METOPROLOL TARTRATE 50 MG PO TABS: 50.0000 mg | ORAL_TABLET | Freq: Two times a day (BID) | ORAL | Status: DC

## 2011-12-15 ENCOUNTER — Ambulatory Visit (INDEPENDENT_AMBULATORY_CARE_PROVIDER_SITE_OTHER): Payer: Medicare Other | Admitting: *Deleted

## 2011-12-15 DIAGNOSIS — I4891 Unspecified atrial fibrillation: Secondary | ICD-10-CM

## 2011-12-31 ENCOUNTER — Other Ambulatory Visit: Payer: Self-pay | Admitting: Cardiology

## 2011-12-31 MED ORDER — WARFARIN SODIUM 3 MG PO TABS: 3.0000 mg | ORAL_TABLET | ORAL | Status: DC

## 2011-12-31 NOTE — Telephone Encounter (Signed)
Pt is out of this med 

## 2011-12-31 NOTE — Telephone Encounter (Signed)
Refill   Patient requesting Rx refill on Warfarin (COUMADIN) 3 MG tablet, verified preferred [facility] [address] In [place], [place]

## 2012-01-01 ENCOUNTER — Other Ambulatory Visit: Payer: Self-pay | Admitting: *Deleted

## 2012-01-01 MED ORDER — WARFARIN SODIUM 3 MG PO TABS: 3.0000 mg | ORAL_TABLET | ORAL | Status: DC

## 2012-01-27 ENCOUNTER — Other Ambulatory Visit (INDEPENDENT_AMBULATORY_CARE_PROVIDER_SITE_OTHER): Payer: Medicare Other

## 2012-01-27 ENCOUNTER — Ambulatory Visit (INDEPENDENT_AMBULATORY_CARE_PROVIDER_SITE_OTHER): Payer: Medicare Other | Admitting: Cardiology

## 2012-01-27 ENCOUNTER — Encounter: Payer: Self-pay | Admitting: Cardiology

## 2012-01-27 ENCOUNTER — Ambulatory Visit (INDEPENDENT_AMBULATORY_CARE_PROVIDER_SITE_OTHER): Payer: Medicare Other | Admitting: *Deleted

## 2012-01-27 VITALS — BP 112/70 | HR 70 | Ht 70.0 in | Wt 141.0 lb

## 2012-01-27 DIAGNOSIS — Z7901 Long term (current) use of anticoagulants: Secondary | ICD-10-CM

## 2012-01-27 DIAGNOSIS — I509 Heart failure, unspecified: Secondary | ICD-10-CM

## 2012-01-27 DIAGNOSIS — I2510 Atherosclerotic heart disease of native coronary artery without angina pectoris: Secondary | ICD-10-CM

## 2012-01-27 DIAGNOSIS — I4891 Unspecified atrial fibrillation: Secondary | ICD-10-CM

## 2012-01-27 DIAGNOSIS — I428 Other cardiomyopathies: Secondary | ICD-10-CM

## 2012-01-27 DIAGNOSIS — E785 Hyperlipidemia, unspecified: Secondary | ICD-10-CM

## 2012-01-27 DIAGNOSIS — I420 Dilated cardiomyopathy: Secondary | ICD-10-CM

## 2012-01-27 LAB — HEPATIC FUNCTION PANEL
ALT: 22 U/L (ref 0–53)
Total Protein: 6.8 g/dL (ref 6.0–8.3)

## 2012-01-27 LAB — LIPID PANEL
Cholesterol: 143 mg/dL (ref 0–200)
HDL: 43.7 mg/dL (ref >39.00)
VLDL: 13.8 mg/dL (ref 0.0–40.0)

## 2012-01-27 LAB — BASIC METABOLIC PANEL
BUN: 22 mg/dL (ref 6–23)
Calcium: 9.5 mg/dL (ref 8.4–10.5)
Potassium: 4.6 mEq/L (ref 3.5–5.1)

## 2012-01-27 LAB — CBC WITH DIFFERENTIAL/PLATELET
Eosinophils Relative: 3.4 % (ref 0.0–5.0)
Lymphocytes Relative: 22.9 % (ref 12.0–46.0)
Lymphs Abs: 2 10*3/uL (ref 0.7–4.0)
Monocytes Absolute: 0.8 10*3/uL (ref 0.1–1.0)
Monocytes Relative: 9.1 % (ref 3.0–12.0)
Neutro Abs: 5.7 10*3/uL (ref 1.4–7.7)
RBC: 4.54 Mil/uL (ref 4.22–5.81)
WBC: 8.9 10*3/uL (ref 4.5–10.5)

## 2012-01-27 MED ORDER — WARFARIN SODIUM 3 MG PO TABS: ORAL_TABLET | ORAL | Status: DC

## 2012-01-27 NOTE — Progress Notes (Signed)
Logan [name] Date of Birth: Jun 07, 1925   History of Present Illness: Logan [name] is seen for followup today. He is feeling very well. He is upset today by how his Coumadin prescription was filled. It was prescribed as directed by the Coumadin clinic and he wanted the prescription to be more specific. He denies any significant shortness of breath or chest pain. He has had no edema. He denies any palpitations. He has had no bleeding difficulty.  Current Outpatient Prescriptions on File Prior to Visit  Medication Sig Dispense Refill  . Calcium-Magnesium-Zinc (CAL-MAG-ZINC PO) Take by mouth daily.        . digoxin (LANOXIN) 0.25 MG tablet TAKE 1 TABLET BY MOUTH EVERY DAY  30 tablet  5  . DIOVAN 160 MG tablet TAKE 1 TABLET BY MOUTH TWICE A DAY  60 tablet  5  . fish oil-omega-3 fatty acids 1000 MG capsule Take 1 g by mouth daily.        . Flaxseed, Linseed, (FLAX SEED OIL PO) Take by mouth daily.        . furosemide (LASIX) 20 MG tablet TAKE 1 TABLET EVERY DAY  30 tablet  5  . levothyroxine (SYNTHROID, LEVOTHROID) 75 MCG tablet Take 1 tablet (75 mcg total) by mouth daily.  30 tablet  5  . metoprolol (LOPRESSOR) 50 MG tablet Take 1 tablet (50 mg total) by mouth 2 (two) times daily.  60 tablet  5  . Multiple Vitamin (MULTIVITAMIN) tablet Take 1 tablet by mouth daily.        . simvastatin (ZOCOR) 80 MG tablet TAKE 1 TABLET EVERY DAY  30 tablet  5  . DISCONTD: warfarin (COUMADIN) 3 MG tablet Take 1 tablet (3 mg total) by mouth as directed. Take as directed by coumadin dlinic  45 tablet  3    No Known Allergies  Past Medical History  Diagnosis Date  . Chronic atrial fibrillation   . Dilated cardiomyopathy   . PVC's (premature ventricular contractions)   . Hyperlipidemia   . COPD (chronic obstructive pulmonary disease)   . Hypothyroidism   . Renal calculi   . Chronic anticoagulation   . CHF (congestive heart failure)     DUE TO SYSTOLIC DYSFUNCTION    Past Surgical History    Procedure Date  . Cardiac catheterization 02/26/2007    EF 35-40%  . Appendectomy   . Tonsillectomy and adenoidectomy   . US echocardiography 07/08/2010    EF 50-55%  . US echocardiography 02/16/2007    EF 25-35%  . Transthoracic echocardiogram 08/26/2001    EF 25-30%  . Cardiovascular stress test 02/18/2007    EF 40%    History  Smoking status  . Former Smoker  . Quit date: 07/12/1991  Smokeless tobacco  . Not on file    History  Alcohol Use No    Family History  Problem Relation Age of Onset  . Stroke Mother   . Seizures Mother   . Heart attack Father   . Heart failure Father   . Heart attack Brother     Review of Systems: As per history of present illness..  All other systems were reviewed and are negative.  Physical Exam: BP 112/70  Pulse 70  Ht 5\' 10"  (1.778 m)  Wt 141 lb (63.957 kg)  BMI 20.23 kg/m2 The patient is alert and oriented x 3.   The skin is warm and dry.  Color is normal.  The HEENT exam reveals that the sclera  are nonicteric.  The mucous membranes are moist.  The carotids are 2+ without bruits.  There is no thyromegaly.  There is no JVD.  The lungs are clear.  The chest wall is non tender.  The heart exam reveals an irregular rate with a normal S1 and S2.  There are no murmurs, gallops, or rubs.  The PMI is not displaced.   Abdominal exam reveals good bowel sounds.  There is no hepatosplenomegaly or tenderness.  There are no masses.  Exam of the legs reveal no clubbing, cyanosis, or edema.  Pedal pulses are intact.  Cranial nerves II - XII are intact.  Motor and sensory functions are intact.  The gait is normal.  LABORATORY DATA: INR today is 2.5. Assessment / Plan:

## 2012-01-27 NOTE — Patient Instructions (Signed)
We will call with the results of your lab work today.  Continue your current therapy  I will see you back in 6 months.

## 2012-01-27 NOTE — Assessment & Plan Note (Signed)
Ejection fraction was as low as 25-35% at 2008. This had improved on his last evaluation up to 50-55%. We will continue with his current medical therapy including ARB, beta blocker, digoxin, and diuretics.

## 2012-01-27 NOTE — Assessment & Plan Note (Signed)
Rate is well controlled on combination of digoxin and metoprolol. He is on chronic anticoagulation with Coumadin with a therapeutic INR. We'll continue his current therapy and followup again in 6 months.

## 2012-01-27 NOTE — Assessment & Plan Note (Signed)
He is on simvastatin and fish oil. We will check fasting lab work today including chemistries and a lipid panel.

## 2012-02-24 ENCOUNTER — Other Ambulatory Visit: Payer: Self-pay

## 2012-02-24 MED ORDER — SIMVASTATIN 80 MG PO TABS: 80.0000 mg | ORAL_TABLET | Freq: Every day | ORAL | Status: DC

## 2012-03-08 ENCOUNTER — Ambulatory Visit (INDEPENDENT_AMBULATORY_CARE_PROVIDER_SITE_OTHER): Payer: Medicare Other | Admitting: *Deleted

## 2012-03-08 DIAGNOSIS — I4891 Unspecified atrial fibrillation: Secondary | ICD-10-CM

## 2012-03-10 ENCOUNTER — Other Ambulatory Visit: Payer: Self-pay | Admitting: Cardiology

## 2012-03-10 MED ORDER — FUROSEMIDE 20 MG PO TABS: 20.0000 mg | ORAL_TABLET | Freq: Every day | ORAL | Status: DC

## 2012-04-19 ENCOUNTER — Other Ambulatory Visit: Payer: Self-pay

## 2012-04-19 ENCOUNTER — Other Ambulatory Visit: Payer: Self-pay | Admitting: Cardiology

## 2012-04-19 ENCOUNTER — Telehealth: Payer: Self-pay

## 2012-04-19 ENCOUNTER — Ambulatory Visit (INDEPENDENT_AMBULATORY_CARE_PROVIDER_SITE_OTHER): Payer: Medicare Other | Admitting: *Deleted

## 2012-04-19 DIAGNOSIS — I4891 Unspecified atrial fibrillation: Secondary | ICD-10-CM

## 2012-04-19 LAB — POCT INR: INR: 2.4

## 2012-04-19 MED ORDER — LEVOTHYROXINE SODIUM 75 MCG PO TABS: 75.0000 ug | ORAL_TABLET | Freq: Every day | ORAL | Status: DC

## 2012-04-19 MED ORDER — METOPROLOL TARTRATE 50 MG PO TABS: 50.0000 mg | ORAL_TABLET | Freq: Two times a day (BID) | ORAL | Status: DC

## 2012-04-19 NOTE — Telephone Encounter (Signed)
Out of pills 

## 2012-04-19 NOTE — Telephone Encounter (Signed)
Patient walked in office stated he needed refill on metoprolol.Metoprolol refill sent to [facility] [address]

## 2012-05-14 ENCOUNTER — Other Ambulatory Visit: Payer: Self-pay | Admitting: *Deleted

## 2012-05-14 MED ORDER — DIGOXIN 250 MCG PO TABS: 0.2500 mg | ORAL_TABLET | Freq: Every day | ORAL | Status: DC

## 2012-05-31 ENCOUNTER — Ambulatory Visit (INDEPENDENT_AMBULATORY_CARE_PROVIDER_SITE_OTHER): Payer: Medicare Other | Admitting: *Deleted

## 2012-05-31 DIAGNOSIS — I4891 Unspecified atrial fibrillation: Secondary | ICD-10-CM

## 2012-05-31 LAB — POCT INR: INR: 2.3

## 2012-06-09 ENCOUNTER — Other Ambulatory Visit: Payer: Self-pay | Admitting: *Deleted

## 2012-06-09 MED ORDER — METOPROLOL TARTRATE 50 MG PO TABS: 50.0000 mg | ORAL_TABLET | Freq: Two times a day (BID) | ORAL | Status: DC

## 2012-07-12 ENCOUNTER — Ambulatory Visit (INDEPENDENT_AMBULATORY_CARE_PROVIDER_SITE_OTHER): Payer: Medicare Other | Admitting: Pharmacist

## 2012-07-12 DIAGNOSIS — I4891 Unspecified atrial fibrillation: Secondary | ICD-10-CM

## 2012-07-25 ENCOUNTER — Emergency Department (HOSPITAL_COMMUNITY): Payer: Medicare Other

## 2012-07-25 ENCOUNTER — Encounter (HOSPITAL_COMMUNITY): Payer: Self-pay | Admitting: Family Medicine

## 2012-07-25 ENCOUNTER — Inpatient Hospital Stay (HOSPITAL_COMMUNITY)
Admission: EM | Admit: 2012-07-25 | Discharge: 2012-07-27 | DRG: 291 | Disposition: A | Discharge status: Home / Self Care | Payer: Medicare Other | Attending: Internal Medicine | Admitting: Internal Medicine

## 2012-07-25 DIAGNOSIS — E039 Hypothyroidism, unspecified: Secondary | ICD-10-CM | POA: Diagnosis present

## 2012-07-25 DIAGNOSIS — I5033 Acute on chronic diastolic (congestive) heart failure: Principal | ICD-10-CM | POA: Diagnosis present

## 2012-07-25 DIAGNOSIS — I482 Chronic atrial fibrillation, unspecified: Secondary | ICD-10-CM | POA: Diagnosis present

## 2012-07-25 DIAGNOSIS — J9600 Acute respiratory failure, unspecified whether with hypoxia or hypercapnia: Secondary | ICD-10-CM | POA: Diagnosis present

## 2012-07-25 DIAGNOSIS — Z7901 Long term (current) use of anticoagulants: Secondary | ICD-10-CM

## 2012-07-25 DIAGNOSIS — E785 Hyperlipidemia, unspecified: Secondary | ICD-10-CM | POA: Diagnosis present

## 2012-07-25 DIAGNOSIS — I420 Dilated cardiomyopathy: Secondary | ICD-10-CM | POA: Diagnosis present

## 2012-07-25 DIAGNOSIS — Z8521 Personal history of malignant neoplasm of larynx: Secondary | ICD-10-CM

## 2012-07-25 DIAGNOSIS — J441 Chronic obstructive pulmonary disease with (acute) exacerbation: Secondary | ICD-10-CM | POA: Diagnosis present

## 2012-07-25 DIAGNOSIS — I509 Heart failure, unspecified: Secondary | ICD-10-CM

## 2012-07-25 DIAGNOSIS — I5023 Acute on chronic systolic (congestive) heart failure: Secondary | ICD-10-CM

## 2012-07-25 DIAGNOSIS — Z87891 Personal history of nicotine dependence: Secondary | ICD-10-CM

## 2012-07-25 DIAGNOSIS — J209 Acute bronchitis, unspecified: Secondary | ICD-10-CM | POA: Diagnosis present

## 2012-07-25 DIAGNOSIS — I428 Other cardiomyopathies: Secondary | ICD-10-CM | POA: Diagnosis present

## 2012-07-25 DIAGNOSIS — I4891 Unspecified atrial fibrillation: Secondary | ICD-10-CM

## 2012-07-25 LAB — CBC WITH DIFFERENTIAL/PLATELET
Eosinophils Absolute: 0.1 10*3/uL (ref 0.0–0.7)
Eosinophils Relative: 1 % (ref 0–5)
HCT: 44.1 % (ref 39.0–52.0)
Lymphocytes Relative: 9 % — ABNORMAL LOW (ref 12–46)
Lymphs Abs: 0.9 10*3/uL (ref 0.7–4.0)
MCH: 33.5 pg (ref 26.0–34.0)
MCV: 99.8 fL (ref 78.0–100.0)
Monocytes Absolute: 1.4 10*3/uL — ABNORMAL HIGH (ref 0.1–1.0)
Monocytes Relative: 14 % — ABNORMAL HIGH (ref 3–12)
Platelets: 202 10*3/uL (ref 150–400)
RBC: 4.42 MIL/uL (ref 4.22–5.81)
WBC: 9.7 10*3/uL (ref 4.0–10.5)

## 2012-07-25 LAB — COMPREHENSIVE METABOLIC PANEL
ALT: 19 U/L (ref 0–53)
BUN: 16 mg/dL (ref 6–23)
CO2: 29 mEq/L (ref 19–32)
Calcium: 9.7 mg/dL (ref 8.4–10.5)
Creatinine, Ser: 0.72 mg/dL (ref 0.50–1.35)
GFR calc Af Amer: >90 mL/min (ref >90)
GFR calc non Af Amer: 83 mL/min — ABNORMAL LOW (ref >90)
Glucose, Bld: 134 mg/dL — ABNORMAL HIGH (ref 70–99)
Sodium: 138 mEq/L (ref 135–145)
Total Protein: 7.4 g/dL (ref 6.0–8.3)

## 2012-07-25 LAB — TSH: TSH: 0.44 u[IU]/mL (ref 0.350–4.500)

## 2012-07-25 LAB — POCT I-STAT TROPONIN I: Troponin i, poc: 0 ng/mL (ref 0.00–0.08)

## 2012-07-25 LAB — URINALYSIS, ROUTINE W REFLEX MICROSCOPIC
Bilirubin Urine: NEGATIVE
Glucose, UA: NEGATIVE mg/dL
Hgb urine dipstick: NEGATIVE
Protein, ur: NEGATIVE mg/dL

## 2012-07-25 LAB — PRO B NATRIURETIC PEPTIDE: Pro B Natriuretic peptide (BNP): 2131 pg/mL — ABNORMAL HIGH (ref 0–450)

## 2012-07-25 LAB — APTT: aPTT: 55 seconds — ABNORMAL HIGH (ref 24–37)

## 2012-07-25 LAB — DIGOXIN LEVEL: Digoxin Level: 0.8 ng/mL (ref 0.8–2.0)

## 2012-07-25 LAB — PROTIME-INR: INR: 2.29 — ABNORMAL HIGH (ref 0.00–1.49)

## 2012-07-25 MED ORDER — WARFARIN SODIUM 3 MG PO TABS: 3.0000 mg | ORAL_TABLET | ORAL | Status: DC

## 2012-07-25 MED ORDER — ALBUTEROL SULFATE (5 MG/ML) 0.5% IN NEBU
5.0000 mg | INHALATION_SOLUTION | Freq: Once | RESPIRATORY_TRACT | Status: AC
  Administered 2012-07-25: 5 mg via RESPIRATORY_TRACT
  Filled 2012-07-25: qty 1

## 2012-07-25 MED ORDER — METOPROLOL TARTRATE 50 MG PO TABS
50.0000 mg | ORAL_TABLET | Freq: Two times a day (BID) | ORAL | Status: DC
  Administered 2012-07-25 – 2012-07-26 (×2): 50 mg via ORAL
  Filled 2012-07-25 (×4): qty 1

## 2012-07-25 MED ORDER — OMEGA-3 FATTY ACIDS 1000 MG PO CAPS: 1.0000 g | ORAL_CAPSULE | Freq: Every day | ORAL | Status: DC

## 2012-07-25 MED ORDER — DEXTROSE 5 % IV SOLN
1.0000 g | INTRAVENOUS | Status: DC
  Administered 2012-07-25: 1 g via INTRAVENOUS
  Filled 2012-07-25 (×2): qty 10

## 2012-07-25 MED ORDER — ONDANSETRON HCL 4 MG PO TABS: 4.0000 mg | ORAL_TABLET | Freq: Four times a day (QID) | ORAL | Status: DC | PRN

## 2012-07-25 MED ORDER — SODIUM CHLORIDE 0.9 % IJ SOLN
3.0000 mL | Freq: Two times a day (BID) | INTRAMUSCULAR | Status: DC
  Administered 2012-07-25 – 2012-07-27 (×4): 3 mL via INTRAVENOUS

## 2012-07-25 MED ORDER — OMEGA-3-ACID ETHYL ESTERS 1 G PO CAPS
1.0000 g | ORAL_CAPSULE | Freq: Every day | ORAL | Status: DC
  Administered 2012-07-25 – 2012-07-27 (×3): 1 g via ORAL
  Filled 2012-07-25 (×3): qty 1

## 2012-07-25 MED ORDER — ALBUTEROL SULFATE (5 MG/ML) 0.5% IN NEBU
2.5000 mg | INHALATION_SOLUTION | Freq: Four times a day (QID) | RESPIRATORY_TRACT | Status: DC
  Administered 2012-07-25 (×2): 2.5 mg via RESPIRATORY_TRACT
  Filled 2012-07-25 (×2): qty 0.5

## 2012-07-25 MED ORDER — WARFARIN SODIUM 4 MG PO TABS
4.5000 mg | ORAL_TABLET | ORAL | Status: DC
  Administered 2012-07-26: 4.5 mg via ORAL
  Filled 2012-07-25 (×2): qty 1

## 2012-07-25 MED ORDER — IPRATROPIUM BROMIDE 0.02 % IN SOLN
0.5000 mg | Freq: Four times a day (QID) | RESPIRATORY_TRACT | Status: DC
  Administered 2012-07-25 (×2): 0.5 mg via RESPIRATORY_TRACT
  Filled 2012-07-25 (×2): qty 2.5

## 2012-07-25 MED ORDER — ALBUTEROL SULFATE (5 MG/ML) 0.5% IN NEBU: 2.5000 mg | INHALATION_SOLUTION | RESPIRATORY_TRACT | Status: DC | PRN

## 2012-07-25 MED ORDER — ATORVASTATIN CALCIUM 40 MG PO TABS
40.0000 mg | ORAL_TABLET | Freq: Every day | ORAL | Status: DC
  Administered 2012-07-25 – 2012-07-26 (×2): 40 mg via ORAL
  Filled 2012-07-25 (×3): qty 1

## 2012-07-25 MED ORDER — DIGOXIN 250 MCG PO TABS
0.2500 mg | ORAL_TABLET | Freq: Every day | ORAL | Status: DC
  Administered 2012-07-26 – 2012-07-27 (×2): 0.25 mg via ORAL
  Filled 2012-07-25 (×3): qty 1

## 2012-07-25 MED ORDER — IRBESARTAN 75 MG PO TABS
75.0000 mg | ORAL_TABLET | Freq: Every day | ORAL | Status: DC
  Administered 2012-07-25 – 2012-07-27 (×3): 75 mg via ORAL
  Filled 2012-07-25 (×3): qty 1

## 2012-07-25 MED ORDER — DEXTROSE 5 % IV SOLN
500.0000 mg | INTRAVENOUS | Status: DC
  Administered 2012-07-25: 500 mg via INTRAVENOUS
  Filled 2012-07-25 (×2): qty 500

## 2012-07-25 MED ORDER — METHYLPREDNISOLONE SODIUM SUCC 125 MG IJ SOLR
125.0000 mg | Freq: Once | INTRAMUSCULAR | Status: AC
  Administered 2012-07-25: 125 mg via INTRAVENOUS
  Filled 2012-07-25: qty 2

## 2012-07-25 MED ORDER — HYDROCODONE-ACETAMINOPHEN 5-325 MG PO TABS: 1.0000 | ORAL_TABLET | ORAL | Status: DC | PRN

## 2012-07-25 MED ORDER — WARFARIN SODIUM 2.5 MG PO TABS: 2.5000 mg | ORAL_TABLET | Freq: Every day | ORAL | Status: DC

## 2012-07-25 MED ORDER — ONDANSETRON HCL 4 MG/2ML IJ SOLN: 4.0000 mg | Freq: Four times a day (QID) | INTRAMUSCULAR | Status: DC | PRN

## 2012-07-25 MED ORDER — LEVALBUTEROL HCL 0.63 MG/3ML IN NEBU
0.6300 mg | INHALATION_SOLUTION | Freq: Four times a day (QID) | RESPIRATORY_TRACT | Status: DC
  Filled 2012-07-25 (×3): qty 3

## 2012-07-25 MED ORDER — LEVALBUTEROL HCL 0.63 MG/3ML IN NEBU
0.6300 mg | INHALATION_SOLUTION | Freq: Three times a day (TID) | RESPIRATORY_TRACT | Status: DC
  Administered 2012-07-26 – 2012-07-27 (×4): 0.63 mg via RESPIRATORY_TRACT
  Filled 2012-07-25 (×6): qty 3

## 2012-07-25 MED ORDER — LEVOTHYROXINE SODIUM 75 MCG PO TABS
75.0000 ug | ORAL_TABLET | Freq: Every day | ORAL | Status: DC
  Administered 2012-07-26 – 2012-07-27 (×2): 75 ug via ORAL
  Filled 2012-07-25 (×3): qty 1

## 2012-07-25 MED ORDER — LEVALBUTEROL HCL 0.63 MG/3ML IN NEBU
0.6300 mg | INHALATION_SOLUTION | Freq: Four times a day (QID) | RESPIRATORY_TRACT | Status: DC | PRN
  Filled 2012-07-25: qty 3

## 2012-07-25 MED ORDER — WARFARIN - PHARMACIST DOSING INPATIENT: Freq: Every day | Status: DC

## 2012-07-25 MED ORDER — IPRATROPIUM BROMIDE 0.02 % IN SOLN
0.5000 mg | Freq: Three times a day (TID) | RESPIRATORY_TRACT | Status: DC
  Administered 2012-07-26 – 2012-07-27 (×4): 0.5 mg via RESPIRATORY_TRACT
  Filled 2012-07-25 (×4): qty 2.5

## 2012-07-25 MED ORDER — ADULT MULTIVITAMIN W/MINERALS CH
1.0000 | ORAL_TABLET | Freq: Every day | ORAL | Status: DC
  Administered 2012-07-25 – 2012-07-27 (×3): 1 via ORAL
  Filled 2012-07-25 (×3): qty 1

## 2012-07-25 MED ORDER — POLYETHYLENE GLYCOL 3350 17 G PO PACK
17.0000 g | PACK | Freq: Every day | ORAL | Status: DC | PRN
  Filled 2012-07-25: qty 1

## 2012-07-25 MED ORDER — GUAIFENESIN ER 600 MG PO TB12
1200.0000 mg | ORAL_TABLET | Freq: Two times a day (BID) | ORAL | Status: DC
  Administered 2012-07-25 – 2012-07-27 (×5): 1200 mg via ORAL
  Filled 2012-07-25 (×6): qty 2

## 2012-07-25 MED ORDER — IPRATROPIUM BROMIDE 0.02 % IN SOLN
0.5000 mg | Freq: Once | RESPIRATORY_TRACT | Status: AC
  Administered 2012-07-25: 0.5 mg via RESPIRATORY_TRACT
  Filled 2012-07-25: qty 2.5

## 2012-07-25 MED ORDER — ACETAMINOPHEN 325 MG PO TABS: 650.0000 mg | ORAL_TABLET | Freq: Four times a day (QID) | ORAL | Status: DC | PRN

## 2012-07-25 MED ORDER — TRAZODONE 25 MG HALF TABLET
25.0000 mg | ORAL_TABLET | Freq: Every evening | ORAL | Status: DC | PRN
  Filled 2012-07-25: qty 1

## 2012-07-25 MED ORDER — ONE-DAILY MULTI VITAMINS PO TABS: 1.0000 | ORAL_TABLET | Freq: Every day | ORAL | Status: DC

## 2012-07-25 MED ORDER — MORPHINE SULFATE 2 MG/ML IJ SOLN: 1.0000 mg | INTRAMUSCULAR | Status: DC | PRN

## 2012-07-25 MED ORDER — SODIUM CHLORIDE 0.9 % IV SOLN: INTRAVENOUS | Status: DC

## 2012-07-25 MED ORDER — FUROSEMIDE 10 MG/ML IJ SOLN
40.0000 mg | Freq: Once | INTRAMUSCULAR | Status: AC
  Administered 2012-07-25: 40 mg via INTRAVENOUS
  Filled 2012-07-25: qty 4

## 2012-07-25 MED ORDER — ACETAMINOPHEN 650 MG RE SUPP: 650.0000 mg | Freq: Four times a day (QID) | RECTAL | Status: DC | PRN

## 2012-07-25 NOTE — ED Notes (Signed)
Per pt SOB since Thursday. sts he felt better and then this am felt worse. Pt sts productive cough. Audible rattling in chest.

## 2012-07-25 NOTE — Progress Notes (Signed)
ANTICOAGULATION CONSULT NOTE - Initial Consult  Pharmacy Consult for Warfarin Indication: atrial fibrillation  No Known Allergies  Patient Measurements: Height: 5\' 10"  (177.8 cm) Weight: 132 lb 0.9 oz (59.9 kg) IBW/kg (Calculated) : 73   Vital Signs: Temp: 98.2 F (36.8 C) (10/06 1242) Temp src: Oral (10/06 1242) BP: 120/54 mmHg (10/06 1242) Pulse Rate: 97  (10/06 1242)  Labs:  Basename 07/25/12 0946 07/25/12 0840  HGB -- 14.8  HCT -- 44.1  PLT -- 202  APTT 55* --  LABPROT -- 24.2*  INR -- 2.29*  HEPARINUNFRC -- --  CREATININE -- 0.72  CKTOTAL -- --  CKMB -- --  TROPONINI -- --    Estimated Creatinine Clearance: 57.2 ml/min (by C-G formula based on Cr of 0.72).   Medical History: Past Medical History  Diagnosis Date  . Chronic atrial fibrillation   . Dilated cardiomyopathy   . PVC's (premature ventricular contractions)   . Hyperlipidemia   . COPD (chronic obstructive pulmonary disease)   . Hypothyroidism   . Renal calculi   . Chronic anticoagulation   . CHF (congestive heart failure)     DUE TO SYSTOLIC DYSFUNCTION    Assessment: 78 y.o. M to resume warfarin from PTA for hx Afib. PTA the patient was noted to be taking 4.5 mg daily EXCEPT for 3 mg on Thursdays. The patient has already taken his dose today. INR on admission was therapeutic (INR 2.29, goal of 2-3). Hgb/Hct/Plt stable. Will plan to resume the patient's home dose starting tomorrow. Will move administration to the evenings per our hospital policy.  Goal of Therapy:  INR 2-3    Plan:  1. Resume warfarin 4.5 mg daily EXCEPT for 3 mg on Thursdays only (starting on 07/26/12) 2. Daily PT/INR 3. Will continue to monitor for any signs/symptoms of bleeding and will follow up with PT/INR in the a.m.   Georgina Pillion, PharmD, BCPS Clinical Pharmacist Pager: [phone] 07/25/2012 1:16 PM

## 2012-07-25 NOTE — H&P (Signed)
[facility] History and Physical  KHAN CHURA ZOX:[id number] DOB: February 16, 1925 DOA: 07/25/2012  Referring physician: Carleene Cooper III, MD PCP: Peter Swazil[place], MD   Chief Complaint: SOB  HPI: Logan [name] is a 78 y.o. male with past medical history of a nonischemic cardiomyopathy, systolic CHF and atrial fibrillation. Patient also had history of laryngeal cancer treated with radiation. Patient came into the hospital because of shortness of breath. His symptoms started a few days ago with shortness of breath, cough and some sputum production. He produces offwhite creamy sputum, he denies any palpitations or chest pain. Upon initial evaluation in the emergency department patient was found to be hypoxic, his oxygen saturation went down to 81% on room air. His chest x-ray showed emphysema and increase bibasilar interstitial marking which might be consistent with mild pulmonary edema versus increasing fibrosis or atypical infectious process. Patient admitted to the hospital for further evaluation   Review of Systems:  Constitutional: negative for anorexia, fevers and sweats Eyes: negative for irritation, redness and visual disturbance Ears, nose, mouth, throat, and face: negative for earaches, epistaxis, nasal congestion and sore throat Respiratory:  per history of present illness  Cardiovascular: negative for chest pain, dyspnea, lower extremity edema, orthopnea, palpitations and syncope Gastrointestinal: negative for abdominal pain, constipation, diarrhea, melena, nausea and vomiting Genitourinary:negative for dysuria, frequency and hematuria Hematologic/lymphatic: negative for bleeding, easy bruising and lymphadenopathy Musculoskeletal:negative for arthralgias, muscle weakness and stiff joints Neurological: negative for coordination problems, gait problems, headaches and weakness Endocrine: negative for diabetic symptoms including polydipsia, polyuria and weight  loss Allergic/Immunologic: negative for anaphylaxis, hay fever and urticaria   Past Medical History  Diagnosis Date  . Chronic atrial fibrillation   . Dilated cardiomyopathy   . PVC's (premature ventricular contractions)   . Hyperlipidemia   . COPD (chronic obstructive pulmonary disease)   . Hypothyroidism   . Renal calculi   . Chronic anticoagulation   . CHF (congestive heart failure)     DUE TO SYSTOLIC DYSFUNCTION   Past Surgical History  Procedure Date  . Cardiac catheterization 02/26/2007    EF 35-40%  . Appendectomy   . Tonsillectomy and adenoidectomy   . US echocardiography 07/08/2010    EF 50-55%  . US echocardiography 02/16/2007    EF 25-35%  . Transthoracic echocardiogram 08/26/2001    EF 25-30%  . Cardiovascular stress test 02/18/2007    EF 40%   Social History:  reports that he quit smoking about 21 years ago. He does not have any smokeless tobacco history on file. He reports that he does not drink alcohol or use illicit drugs.   No Known Allergies  Family History  Problem Relation Age of Onset  . Stroke Mother   . Seizures Mother   . Heart attack Father   . Heart failure Father   . Heart attack Brother     Prior to Admission medications   Medication Sig Start Date End Date Taking? Authorizing Provider  Calcium-Magnesium-Zinc (CAL-MAG-ZINC PO) Take by mouth daily.     Yes Historical Provider, MD  digoxin (LANOXIN) 0.25 MG tablet Take 0.25 mg by mouth daily. 05/14/12  Yes Peter M Swazil[place], MD  fish oil-omega-3 fatty acids 1000 MG capsule Take 1 g by mouth daily.     Yes Historical Provider, MD  Flaxseed, Linseed, (FLAX SEED OIL PO) Take 1 tablet by mouth daily.    Yes Historical Provider, MD  furosemide (LASIX) 20 MG tablet Take 20 mg by mouth daily. 03/10/12  Yes  Peter M [place], MD  levothyroxine (SYNTHROID, LEVOTHROID) 75 MCG tablet Take 75 mcg by mouth daily. 04/19/12  Yes Peter M [place], MD  metoprolol (LOPRESSOR) 50 MG tablet Take 50 mg by mouth 2 (two) times  daily. 06/09/12  Yes Peter M [place], MD  Multiple Vitamin (MULTIVITAMIN) tablet Take 1 tablet by mouth daily.    Yes Historical Provider, MD  simvastatin (ZOCOR) 80 MG tablet Take 80 mg by mouth at bedtime. 02/24/12  Yes Peter M [place], MD  valsartan (DIOVAN) 160 MG tablet Take 160 mg by mouth daily.   Yes Historical Provider, MD  warfarin (COUMADIN) 3 MG tablet Take 3-4.5 mg by mouth daily. Takes one and half tablets 4.5 mg every day except Thursdays and take one tablet 3 mg 01/27/12  Yes Peter M [place], MD   Physical Exam: Filed Vitals:   07/25/12 1130 07/25/12 1145 07/25/12 1151 07/25/12 1242  BP: 114/60 107/49  120/54  Pulse: 85 92  97  Temp:   97.8 F (36.6 C) 98.2 F (36.8 C)  TempSrc:   Oral Oral  Resp: 32 24  24  Height:    5\' 10"  (1.778 m)  Weight:    59.9 kg (132 lb 0.9 oz)  SpO2: 96% 99%  98%   General appearance: alert, cooperative and no distress  Head: Normocephalic, without obvious abnormality, atraumatic  Eyes: conjunctivae/corneas clear. PERRL, EOM's intact. Fundi benign.  Nose: Nares normal. Septum midline. Mucosa normal. No drainage or sinus tenderness.  Throat: lips, mucosa, and tongue normal; teeth and gums normal  Neck: Supple, no masses, no cervical lymphadenopathy, no JVD appreciated, no meningeal signs Resp: Mild bibasilar crackles Chest wall: no tenderness  Cardio: regular rate and rhythm, S1, S2 normal, no murmur, click, rub or gallop  GI: soft, non-tender; bowel sounds normal; no masses, no organomegaly  Extremities: extremities normal, atraumatic, no cyanosis or edema  Skin: Skin color, texture, turgor normal. No rashes or lesions  Neurologic: Alert and oriented X 3, normal strength and tone. Normal symmetric reflexes. Normal coordination and gait  Labs on Admission:  Basic Metabolic Panel:  Lab 07/25/12 1610  NA 138  K 4.8  CL 101  CO2 29  GLUCOSE 134*  BUN 16  CREATININE 0.72  CALCIUM 9.7  MG --  PHOS --   Liver Function Tests:  Lab  07/25/12 0840  AST 31  ALT 19  ALKPHOS 81  BILITOT 0.8  PROT 7.4  ALBUMIN 3.4*   No results found for this basename: LIPASE:5,AMYLASE:5 in the last 168 hours No results found for this basename: AMMONIA:5 in the last 168 hours CBC:  Lab 07/25/12 0840  WBC 9.7  NEUTROABS 7.3  HGB 14.8  HCT 44.1  MCV 99.8  PLT 202   Cardiac Enzymes: No results found for this basename: CKTOTAL:5,CKMB:5,CKMBINDEX:5,TROPONINI:5 in the last 168 hours  BNP (last 3 results)  Basename 07/25/12 0844  PROBNP 2131.0*   CBG: No results found for this basename: GLUCAP:5 in the last 168 hours  Radiological Exams on Admission: Dg Chest Portable 1 View  07/25/2012  *RADIOLOGY REPORT*  Clinical Data: Shortness of breath.  Congestion.  COPD.  PORTABLE CHEST - 1 VIEW  Comparison: 03/18/2006  Findings: Severe emphysema noted.  Mild airway thickening is present with accentuated pulmonary interstitium in the lung bases. Given the portable technique, no overt cardiomegaly.  No blunting of the costophrenic angles.  IMPRESSION:  1.  Severe emphysema. 2.  Accentuated interstitium in the lung bases compared the prior. This  could be from progressive fibrosis, low-level interstitial edema, drug reaction, or atypical infectious process.   Original Report Authenticated By: Dellia Cloud, M.D.     EKG: Independently reviewed.  Assessment/Plan Principal Problem:  *Acute bronchitis Active Problems:  Chronic atrial fibrillation  Dilated cardiomyopathy  Hyperlipidemia  Acute on chronic systolic congestive heart failure   Acute bronchitis -Patient started on azithromycin and ceftriaxone. -Supportive management with inhaled bronchodilators, mucolytics and oxygen. -Rule out atypical infections, check urine for Legionella.  Acute on chronic systolic CHF -Mild exacerbation, likely related to his acute bronchitis. -Has mild bibasilar rhonchi, slightly elevated BNP. -Of Lasix discontinued, 40 mg of IV Lasix was  given. We will dose Lasix as needed. -Check 2-D echocardiogram, BNP in a.m., follow renal function closely.  Chronic atrial fibrillation -Rate controlled with beta blockers and digoxin. -Patient is on Coumadin, his INR is 2.29. I have asked pharmacy to manage his Coumadin.  Hypothyroidism -This is chronic and stable condition, patient is on levothyroxine supplements. -Check TSH level.  Hyperlipidemia -I'll check his FLP, continue his statin medications.  Code Status:Full  Family Communication: Spoke with the patient he is awake and oriented  Disposition Plan: Inpatient, anticipate 2-3 days of in-hospital stay  Time spent: 70 minutes  [facility] Pager [phone]  If 7PM-7AM, please contact night-coverage [email or web address] Password [email or web address] 07/25/2012, 1:09 PM

## 2012-07-25 NOTE — ED Notes (Signed)
Patient states he feels a little better after the breathing treatment.  He has ongoing sob noted at rest

## 2012-07-25 NOTE — Progress Notes (Signed)
Pt with hx of a. Fib experienced multiple episodes of unsustained tachycardia in the 170s-180s during this shift, shortly after receiving respiratory treatments this PM. BP and respirations stable. Baseline HR in the 90s-110s. MD made aware. Albuterol treatments changed to Xopenex. HS Metoprolol given. Will continue to monitor.

## 2012-07-25 NOTE — ED Provider Notes (Signed)
History     CSN: [id number]  Arrival date & time 07/25/12  0820   None     Chief Complaint  Patient presents with  . Shortness of Breath    (Consider location/radiation/quality/duration/timing/severity/associated sxs/prior treatment) HPI Comments: Patient is an 78 year old man who says that he has been breathing hard, and is coughing up phlegm that is creamy colored, his stomach hurts because he's coughed so much. He says he's lost his" get up and go.". He had a flu shot last Tuesday, 5 days ago. He has a prior history of cancer of throat, some 15 years ago. He has COPD and CHF. He has atrial fibrillation and is on Coumadin.  Patient is a 78 y.o. male presenting with cough. The history is provided by the patient and medical records. No language interpreter was used.  Cough This is a new problem. The current episode started more than 2 days ago. The problem occurs every few minutes. The problem has not changed since onset.The cough is productive of sputum. There has been no fever. Associated symptoms include myalgias and shortness of breath. Pertinent negatives include no chills. He has tried nothing for the symptoms. He is not a smoker. His past medical history is significant for COPD and emphysema.    Past Medical History  Diagnosis Date  . Chronic atrial fibrillation   . Dilated cardiomyopathy   . PVC's (premature ventricular contractions)   . Hyperlipidemia   . COPD (chronic obstructive pulmonary disease)   . Hypothyroidism   . Renal calculi   . Chronic anticoagulation   . CHF (congestive heart failure)     DUE TO SYSTOLIC DYSFUNCTION    Past Surgical History  Procedure Date  . Cardiac catheterization 02/26/2007    EF 35-40%  . Appendectomy   . Tonsillectomy and adenoidectomy   . US echocardiography 07/08/2010    EF 50-55%  . US echocardiography 02/16/2007    EF 25-35%  . Transthoracic echocardiogram 08/26/2001    EF 25-30%  . Cardiovascular stress test 02/18/2007    EF  40%    Family History  Problem Relation Age of Onset  . Stroke Mother   . Seizures Mother   . Heart attack Father   . Heart failure Father   . Heart attack Brother     History  Substance Use Topics  . Smoking status: Former Smoker    Quit date: 07/12/1991  . Smokeless tobacco: Not on file  . Alcohol Use: No      Review of Systems  Constitutional: Negative for fever and chills.  HENT: Negative.   Eyes: Negative.   Respiratory: Positive for cough and shortness of breath.   Cardiovascular: Negative.   Gastrointestinal: Negative.   Genitourinary: Negative.   Musculoskeletal: Positive for myalgias.  Skin: Negative.   Neurological: Negative.   Psychiatric/Behavioral: Negative.     Allergies  Review of patient's allergies indicates no known allergies.  Home Medications   Current Outpatient Rx  Name Route Sig Dispense Refill  . CAL-MAG-ZINC PO Oral Take by mouth daily.      Marland Kitchen DIGOXIN 0.25 MG PO TABS Oral Take 0.25 mg by mouth daily.    . OMEGA-3 FATTY ACIDS 1000 MG PO CAPS Oral Take 1 g by mouth daily.      Marland Kitchen FLAX SEED OIL PO Oral Take 1 tablet by mouth daily.     . FUROSEMIDE 20 MG PO TABS Oral Take 20 mg by mouth daily.    Marland Kitchen LEVOTHYROXINE SODIUM 75  MCG PO TABS Oral Take 75 mcg by mouth daily.    Marland Kitchen METOPROLOL TARTRATE 50 MG PO TABS Oral Take 50 mg by mouth 2 (two) times daily.    Marland Kitchen ONE-DAILY MULTI VITAMINS PO TABS Oral Take 1 tablet by mouth daily.     Marland Kitchen SIMVASTATIN 80 MG PO TABS Oral Take 80 mg by mouth at bedtime.    Marland Kitchen VALSARTAN 160 MG PO TABS Oral Take 160 mg by mouth daily.    . WARFARIN SODIUM 3 MG PO TABS Oral Take 3-4.5 mg by mouth daily. Takes one and half tablets 4.5 mg every day except Thursdays and take one tablet 3 mg      BP 133/74  Temp 98.7 F (37.1 C) (Oral)  Resp 27  SpO2 97%  Physical Exam  Nursing note and vitals reviewed. Constitutional: He is oriented to person, place, and time. He appears well-developed and well-nourished.        Tachypneic   HENT:  Head: Normocephalic and atraumatic.  Right Ear: External ear normal.  Left Ear: External ear normal.  Mouth/Throat: Oropharynx is clear and moist.  Eyes: Conjunctivae normal and EOM are normal. Pupils are equal, round, and reactive to light.  Neck: Normal range of motion. Neck supple.  Cardiovascular: Normal rate and normal heart sounds.        Irregular rhythm.  Pulmonary/Chest: Effort normal.       Coarse rhonchi over both lung fields.  Abdominal: Soft. Bowel sounds are normal.  Musculoskeletal: Normal range of motion. He exhibits no edema and no tenderness.  Neurological: He is alert and oriented to person, place, and time.       No sensory or motor deficit.  Skin: Skin is warm and dry.  Psychiatric: He has a normal mood and affect. His behavior is normal.    ED Course  Procedures (including critical care time)   Labs Reviewed  CBC WITH DIFFERENTIAL  COMPREHENSIVE METABOLIC PANEL  PRO B NATRIURETIC PEPTIDE  PROTIME-INR   9:02 AM  Date: 07/25/2012  Rate: 86  Rhythm: atrial fibrillation and premature ventricular contractions (PVC)  QRS Axis: right QRS:  Poor R wave progression in precordial leads suggests old anterior myocardial infarction.  Intervals: normal  ST/T Wave abnormalities: normal  Conduction Disutrbances:none  Narrative Interpretation: Abnormal EKG  Old EKG Reviewed: changes noted-- Had sinus rhythm with frequent PVC's on tracing of 11/17/2005.  9:35 AM Pt seen --> physical exam performed.  Lab workup ordered.  Albuterol/atrovent nebulizer treatment ordered.  Results for orders placed during the hospital encounter of 07/25/12  CBC WITH DIFFERENTIAL      Component Value Range   WBC 9.7  4.0 - 10.5 K/uL   RBC 4.42  4.22 - 5.81 MIL/uL   Hemoglobin 14.8  13.0 - 17.0 g/dL   HCT 16.1  09.6 - 04.5 %   MCV 99.8  78.0 - 100.0 fL   MCH 33.5  26.0 - 34.0 pg   MCHC 33.6  30.0 - 36.0 g/dL   RDW 40.9  81.1 - 91.4 %   Platelets 202  150 - 400  K/uL   Neutrophils Relative 76  43 - 77 %   Neutro Abs 7.3  1.7 - 7.7 K/uL   Lymphocytes Relative 9 (*) 12 - 46 %   Lymphs Abs 0.9  0.7 - 4.0 K/uL   Monocytes Relative 14 (*) 3 - 12 %   Monocytes Absolute 1.4 (*) 0.1 - 1.0 K/uL   Eosinophils Relative 1  0 - 5 %   Eosinophils Absolute 0.1  0.0 - 0.7 K/uL   Basophils Relative 0  0 - 1 %   Basophils Absolute 0.0  0.0 - 0.1 K/uL  COMPREHENSIVE METABOLIC PANEL      Component Value Range   Sodium 138  135 - 145 mEq/L   Potassium 4.8  3.5 - 5.1 mEq/L   Chloride 101  96 - 112 mEq/L   CO2 29  19 - 32 mEq/L   Glucose, Bld 134 (*) 70 - 99 mg/dL   BUN 16  6 - 23 mg/dL   Creatinine, Ser 9.56  0.50 - 1.35 mg/dL   Calcium 9.7  8.4 - 21.3 mg/dL   Total Protein 7.4  6.0 - 8.3 g/dL   Albumin 3.4 (*) 3.5 - 5.2 g/dL   AST 31  0 - 37 U/L   ALT 19  0 - 53 U/L   Alkaline Phosphatase 81  39 - 117 U/L   Total Bilirubin 0.8  0.3 - 1.2 mg/dL   GFR calc non Af Amer 83 (*) >90 mL/min   GFR calc Af Amer >90  >90 mL/min  PRO B NATRIURETIC PEPTIDE      Component Value Range   Pro B Natriuretic peptide (BNP) 2131.0 (*) 0 - 450 pg/mL  PROTIME-INR      Component Value Range   Prothrombin Time 24.2 (*) 11.6 - 15.2 seconds   INR 2.29 (*) 0.00 - 1.49  POCT I-STAT TROPONIN I      Component Value Range   Troponin i, poc 0.00  0.00 - 0.08 ng/mL   Comment 3           URINALYSIS, ROUTINE W REFLEX MICROSCOPIC      Component Value Range   Color, Urine YELLOW  YELLOW   APPearance CLEAR  CLEAR   Specific Gravity, Urine 1.020  1.005 - 1.030   pH 5.5  5.0 - 8.0   Glucose, UA NEGATIVE  NEGATIVE mg/dL   Hgb urine dipstick NEGATIVE  NEGATIVE   Bilirubin Urine NEGATIVE  NEGATIVE   Ketones, ur NEGATIVE  NEGATIVE mg/dL   Protein, ur NEGATIVE  NEGATIVE mg/dL   Urobilinogen, UA 1.0  0.0 - 1.0 mg/dL   Nitrite NEGATIVE  NEGATIVE   Leukocytes, UA NEGATIVE  NEGATIVE  APTT      Component Value Range   aPTT 55 (*) 24 - 37 seconds  DIGOXIN LEVEL      Component Value Range    Digoxin Level 0.8  0.8 - 2.0 ng/mL  POCT I-STAT TROPONIN I      Component Value Range   Troponin i, poc 0.00  0.00 - 0.08 ng/mL   Comment 3            Dg Chest Portable 1 View  07/25/2012  *RADIOLOGY REPORT*  Clinical Data: Shortness of breath.  Congestion.  COPD.  PORTABLE CHEST - 1 VIEW  Comparison: 03/18/2006  Findings: Severe emphysema noted.  Mild airway thickening is present with accentuated pulmonary interstitium in the lung bases. Given the portable technique, no overt cardiomegaly.  No blunting of the costophrenic angles.  IMPRESSION:  1.  Severe emphysema. 2.  Accentuated interstitium in the lung bases compared the prior. This could be from progressive fibrosis, low-level interstitial edema, drug reaction, or atypical infectious process.   Original Report Authenticated By: Dellia Cloud, M.D.    11:24 AM Case discussed with Dr. Arthor Captain --> admit to [facility] to a telemetry  unit.  1. COPD exacerbation         Carleene Cooper III, MD 07/25/12 1124

## 2012-07-25 NOTE — Progress Notes (Signed)
Pt admitted to 4715, pt alert and oriented, vitals wnl, and pt placed on tele.  Dr. Arthor Captain notified and will carry out MD orders.

## 2012-07-25 NOTE — ED Notes (Signed)
Patient has had cough with production x several days.  He reports yellow colored sputum.  Patient has hx of chf as well.  No noted edema,  No abd swelling.  Patient reports he is voiding per normal with use of diuretic.  Patient remains on cardiac monitoring.  He denies chest pain.  Patient states his abdomen is painful when he coughs

## 2012-07-26 ENCOUNTER — Ambulatory Visit: Payer: Medicare Other | Admitting: Cardiology

## 2012-07-26 DIAGNOSIS — I517 Cardiomegaly: Secondary | ICD-10-CM

## 2012-07-26 DIAGNOSIS — E785 Hyperlipidemia, unspecified: Secondary | ICD-10-CM

## 2012-07-26 LAB — CBC
Hemoglobin: 13.9 g/dL (ref 13.0–17.0)
RBC: 4.26 MIL/uL (ref 4.22–5.81)
WBC: 6.6 10*3/uL (ref 4.0–10.5)

## 2012-07-26 LAB — BASIC METABOLIC PANEL
Chloride: 100 mEq/L (ref 96–112)
GFR calc Af Amer: >90 mL/min (ref >90)
GFR calc non Af Amer: 80 mL/min — ABNORMAL LOW (ref >90)
Potassium: 4.1 mEq/L (ref 3.5–5.1)
Sodium: 139 mEq/L (ref 135–145)

## 2012-07-26 LAB — LIPID PANEL
Cholesterol: 160 mg/dL (ref 0–200)
HDL: 48 mg/dL (ref >39)
Total CHOL/HDL Ratio: 3.3 RATIO
Triglycerides: 63 mg/dL (ref <150)
VLDL: 13 mg/dL (ref 0–40)

## 2012-07-26 LAB — LEGIONELLA ANTIGEN, URINE: Legionella Antigen, Urine: NEGATIVE

## 2012-07-26 MED ORDER — METOPROLOL TARTRATE 50 MG PO TABS
75.0000 mg | ORAL_TABLET | Freq: Two times a day (BID) | ORAL | Status: DC
  Administered 2012-07-26 – 2012-07-27 (×2): 75 mg via ORAL
  Filled 2012-07-26 (×3): qty 1

## 2012-07-26 MED ORDER — FUROSEMIDE 20 MG PO TABS
20.0000 mg | ORAL_TABLET | Freq: Every day | ORAL | Status: DC
  Administered 2012-07-26 – 2012-07-27 (×2): 20 mg via ORAL
  Filled 2012-07-26 (×2): qty 1

## 2012-07-26 MED ORDER — METOPROLOL TARTRATE 25 MG PO TABS
25.0000 mg | ORAL_TABLET | Freq: Once | ORAL | Status: AC
  Administered 2012-07-26: 25 mg via ORAL
  Filled 2012-07-26: qty 1

## 2012-07-26 MED ORDER — LEVOFLOXACIN 750 MG PO TABS
750.0000 mg | ORAL_TABLET | ORAL | Status: DC
  Administered 2012-07-26: 750 mg via ORAL
  Filled 2012-07-26 (×2): qty 1

## 2012-07-26 NOTE — Progress Notes (Signed)
Pt o2 stats 95% on room air.

## 2012-07-26 NOTE — Progress Notes (Addendum)
Subjective: Breathing better.  Denies any chest pain or shortness of breath.  Objective: Vital signs in last 24 hours: Filed Vitals:   07/26/12 0513 07/26/12 0809 07/26/12 0922 07/26/12 0923  BP: 106/60   109/68  Pulse: 94  110   Temp: 98.2 F (36.8 C)     TempSrc: Oral     Resp: 20     Height:      Weight: 59.5 kg (131 lb 2.8 oz)     SpO2: 95% 94%     Weight change:   Intake/Output Summary (Last 24 hours) at 07/26/12 1026 Last data filed at 07/26/12 0925  Gross per 24 hour  Intake    686 ml  Output   1600 ml  Net   -914 ml    Physical Exam: General: Awake, Oriented, No acute distress. HEENT: EOMI. Neck: Supple CV: S1 and S2 Lungs: Scattered crackles at bases. Abdomen: Soft, Nontender, Nondistended, +bowel sounds. Ext: Good pulses. Trace edema.  Lab Results: Basic Metabolic Panel:  Lab 07/26/12 7846 07/25/12 0840  NA 139 138  K 4.1 4.8  CL 100 101  CO2 31 29  GLUCOSE 142* 134*  BUN 23 16  CREATININE 0.77 0.72  CALCIUM 9.5 9.7  MG -- --  PHOS -- --   Liver Function Tests:  Lab 07/25/12 0840  AST 31  ALT 19  ALKPHOS 81  BILITOT 0.8  PROT 7.4  ALBUMIN 3.4*   No results found for this basename: LIPASE:5,AMYLASE:5 in the last 168 hours No results found for this basename: AMMONIA:5 in the last 168 hours CBC:  Lab 07/26/12 0038 07/25/12 0840  WBC 6.6 9.7  NEUTROABS -- 7.3  HGB 13.9 14.8  HCT 42.0 44.1  MCV 98.6 99.8  PLT 196 202   Cardiac Enzymes:  Lab 07/26/12 0038 07/25/12 1846 07/25/12 1305  CKTOTAL -- -- --  CKMB -- -- --  CKMBINDEX -- -- --  TROPONINI <0.30 <0.30 <0.30   BNP (last 3 results)  Basename 07/26/12 0038 07/25/12 0844  PROBNP 1399.0* 2131.0*   CBG: No results found for this basename: GLUCAP:5 in the last 168 hours No results found for this basename: HGBA1C:5 in the last 72 hours Other Labs: No components found with this basename: POCBNP:3 No results found for this basename: DDIMER:2 in the last 168 hours  Lab  07/26/12 0038  CHOL 160  HDL 48  LDLCALC 99  TRIG 63  CHOLHDL 3.3  LDLDIRECT --    Lab 07/25/12 1305  TSH 0.440  T4TOTAL --  T3FREE --  FREET4 --  THYROIDAB --   No results found for this basename: VITAMINB12:2,FOLATE:2,FERRITIN:2,TIBC:2,IRON:2,RETICCTPCT:2 in the last 168 hours  Micro Results: No results found for this or any previous visit (from the past 240 hour(s)).  Studies/Results: Dg Chest Portable 1 View  07/25/2012  *RADIOLOGY REPORT*  Clinical Data: Shortness of breath.  Congestion.  COPD.  PORTABLE CHEST - 1 VIEW  Comparison: 03/18/2006  Findings: Severe emphysema noted.  Mild airway thickening is present with accentuated pulmonary interstitium in the lung bases. Given the portable technique, no overt cardiomegaly.  No blunting of the costophrenic angles.  IMPRESSION:  1.  Severe emphysema. 2.  Accentuated interstitium in the lung bases compared the prior. This could be from progressive fibrosis, low-level interstitial edema, drug reaction, or atypical infectious process.   Original Report Authenticated By: Dellia Cloud, M.D.     Medications: I have reviewed the patient's current medications. Scheduled Meds:   . atorvastatin  40 mg Oral q1800  . azithromycin  500 mg Intravenous Q24H  . cefTRIAXone (ROCEPHIN)  IV  1 g Intravenous Q24H  . digoxin  0.25 mg Oral Daily  . furosemide  40 mg Intravenous Once  . guaiFENesin  1,200 mg Oral BID  . ipratropium  0.5 mg Nebulization TID  . irbesartan  75 mg Oral Daily  . levalbuterol  0.63 mg Nebulization TID  . levothyroxine  75 mcg Oral Daily  . methylPREDNISolone (SOLU-MEDROL) injection  125 mg Intravenous Once  . metoprolol  50 mg Oral BID  . multivitamin with minerals  1 tablet Oral Daily  . omega-3 acid ethyl esters  1 g Oral Daily  . sodium chloride  3 mL Intravenous Q12H  . warfarin  3 mg Oral Q Thu-1800  . warfarin  4.5 mg Oral Custom  . Warfarin - Pharmacist Dosing Inpatient   Does not apply q1800  .  DISCONTD: sodium chloride   Intravenous STAT  . DISCONTD: albuterol  2.5 mg Nebulization Q6H  . DISCONTD: fish oil-omega-3 fatty acids  1 g Oral Daily  . DISCONTD: ipratropium  0.5 mg Nebulization Q6H  . DISCONTD: levalbuterol  0.63 mg Nebulization Q6H  . DISCONTD: multivitamin  1 tablet Oral Daily  . DISCONTD: multivitamin  1 tablet Oral Daily  . DISCONTD: warfarin  2.5 mg Oral q1800   Continuous Infusions:  PRN Meds:.acetaminophen, acetaminophen, HYDROcodone-acetaminophen, levalbuterol, morphine injection, ondansetron (ZOFRAN) IV, ondansetron, polyethylene glycol, traZODone, DISCONTD: albuterol  Assessment/Plan: Acute hypoxic respiratory failure secondary to acute bronchitis  Patient started on azithromycin and ceftriaxone on 07/26/2012, transition to levofloxacin. Continue supportive management with inhaled bronchodilators, mucolytics and oxygen.  Urine Legionella negative. Wean oxygen as tolerated.  Acute on chronic systolic CHF  Mild exacerbation, likely related to his acute bronchitis. Has mild bibasilar rhonchi, slightly elevated BNP. Lasix given 40 mg of IV Lasix was given yesterday. Restart lasix 20 mg daily. Check 2-D echocardiogram today.  Pro-BNP improved.    Chronic atrial fibrillation  Rate now well controlled, patient tachycardic at times may be due to albuterol lab. Increase metoprolol to 75 mg twice daily.  Continue digoxin.  Continue anticoagulation on Coumadin.   Hypothyroidism  Continue levothyroxine.  TSH 0.440 07/25/2012, normal.  Hyperlipidemia  Continue his statin medications.   Code Status: Full  Family Communication: Discussed with patient.  Disposition Plan: Inpatient, anticipate discharge 1-2 days.    LOS: 1 day  Tam Delisle A, MD 07/26/2012, 10:26 AM

## 2012-07-26 NOTE — Progress Notes (Signed)
Brief Nutrition Note:  Pt reported weight loss and poor appetite PTA per MST (Malnutrition Screening Tool). States that weight loss was over 1 year ago, and has been stable x 1 year. Appetite is decreased in the AM, but eats 3 meals daily.   Weight hx:  Wt Readings from Last 5 Encounters:  07/26/12 131 lb 2.8 oz (59.5 kg)  01/27/12 141 lb (63.957 kg)  07/28/11 143 lb 6.4 oz (65.046 kg)  Weight loss of 7% in 6 months, not significant  Current diet is heart, pt consumed 100% of last meal.   RD consulted for diet education. Please see education note for details.    Clarene Duke RD, LDN Pager [phone] After Hours pager [phone]

## 2012-07-26 NOTE — Plan of Care (Signed)
Problem: Not Ready for Diet/Lifestyle Change (NB-1.3) Goal: Nutrition education Formal process to instruct or train a patient/client in a skill or to impart knowledge to help patients/clients voluntarily manage or modify food choices and eating behavior to maintain or improve health.  Outcome: Completed/Met Date Met:  07/26/12 Nutrition Education Note  RD consulted for nutrition education for pt with CHF.  RD provided "Low Sodium Nutrition Therapy" handout from the Academy of [employer]. Reviewed patient's dietary recall. Provided examples on ways to decrease sodium intake in diet. Discouraged intake of processed foods and use of salt shaker. Pt states that he does not eat salt and that he doesn't have to worry about these kinds of things. Pt states he eats out for lunch at a fast food restaurant daily. RD explained the amount of sodium in restaurant meals and encouraged pt to limit meals outside the home as much as possible. Pt denied need to change.   Expect poor compliance.  Body mass index is 18.82 kg/(m^2). Pt meets criteria for underweight based on current BMI. Weight is stable and pt eating well, no need for nutrition supplements at this time.   Current diet order is Heart, patient is consuming approximately 100% of meals at this time. Labs and medications reviewed. No further nutrition interventions warranted at this time. RD contact information provided. If additional nutrition issues arise, please re-consult RD.   Clarene Duke RD, LDN Pager [phone] After Hours pager [phone]

## 2012-07-26 NOTE — Progress Notes (Signed)
ANTIBIOTIC CONSULT NOTE - INITIAL  Pharmacy Consult for levaquin Indication: acute bronchitis  No Known Allergies  Patient Measurements: Height: 5\' 10"  (177.8 cm) Weight: 131 lb 2.8 oz (59.5 kg) IBW/kg (Calculated) : 73    Vital Signs: Temp: 98.2 F (36.8 C) (10/07 0513) Temp src: Oral (10/07 0513) BP: 109/68 mmHg (10/07 0923) Pulse Rate: 110  (10/07 0922) Intake/Output from previous day: 10/06 0701 - 10/07 0700 In: 243 [P.O.:240; I.V.:3] Out: 1400 [Urine:1400] Intake/Output from this shift: Total I/O In: 443 [P.O.:440; I.V.:3] Out: 300 [Urine:300]  Labs:  [facility] 07/26/12 0038 07/25/12 0840  WBC 6.6 9.7  HGB 13.9 14.8  PLT 196 202  LABCREA -- --  CREATININE 0.77 0.72   Estimated Creatinine Clearance: 56.8 ml/min (by C-G formula based on Cr of 0.77). No results found for this basename: VANCOTROUGH:2,VANCOPEAK:2,VANCORANDOM:2,GENTTROUGH:2,GENTPEAK:2,GENTRANDOM:2,TOBRATROUGH:2,TOBRAPEAK:2,TOBRARND:2,AMIKACINPEAK:2,AMIKACINTROU:2,AMIKACIN:2, in the last 72 hours   Microbiology: No results found for this or any previous visit (from the past 720 hour(s)).  Medical History: Past Medical History  Diagnosis Date  . Chronic atrial fibrillation   . Dilated cardiomyopathy   . PVC's (premature ventricular contractions)   . Hyperlipidemia   . COPD (chronic obstructive pulmonary disease)   . Hypothyroidism   . Renal calculi   . Chronic anticoagulation   . CHF (congestive heart failure)     DUE TO SYSTOLIC DYSFUNCTION    Medications:  Scheduled:    . atorvastatin  40 mg Oral q1800  . digoxin  0.25 mg Oral Daily  . furosemide  40 mg Intravenous Once  . furosemide  20 mg Oral Daily  . guaiFENesin  1,200 mg Oral BID  . ipratropium  0.5 mg Nebulization TID  . irbesartan  75 mg Oral Daily  . levalbuterol  0.63 mg Nebulization TID  . levofloxacin  750 mg Oral Daily  . levothyroxine  75 mcg Oral Daily  . metoprolol tartrate  25 mg Oral Once  . metoprolol  75 mg Oral  BID  . multivitamin with minerals  1 tablet Oral Daily  . omega-3 acid ethyl esters  1 g Oral Daily  . sodium chloride  3 mL Intravenous Q12H  . warfarin  3 mg Oral Q Thu-1800  . warfarin  4.5 mg Oral Custom  . Warfarin - Pharmacist Dosing Inpatient   Does not apply q1800  . DISCONTD: sodium chloride   Intravenous STAT  . DISCONTD: albuterol  2.5 mg Nebulization Q6H  . DISCONTD: azithromycin  500 mg Intravenous Q24H  . DISCONTD: cefTRIAXone (ROCEPHIN)  IV  1 g Intravenous Q24H  . DISCONTD: fish oil-omega-3 fatty acids  1 g Oral Daily  . DISCONTD: ipratropium  0.5 mg Nebulization Q6H  . DISCONTD: levalbuterol  0.63 mg Nebulization Q6H  . DISCONTD: metoprolol  50 mg Oral BID  . DISCONTD: multivitamin  1 tablet Oral Daily  . DISCONTD: multivitamin  1 tablet Oral Daily  . DISCONTD: warfarin  2.5 mg Oral q1800   Assessment: 78 yo male with acue bronchitis on azithromycin/ceftriaxone to transition to Levaquin. Patient noted afebrile and WBC=6.6. MD has dosed levaquin as 750mg  po q24hr (CrCl noted as about 55 ml/min).   Plan:  -No levaquin changes needed -Stop date noted as 07/31/12 per MD  Will sign off for now. Please call pharmacy for any needs.  Thank you.  Harland German, Pharm D 07/26/2012 12:34 PM

## 2012-07-26 NOTE — Progress Notes (Signed)
  Echocardiogram 2D Echocardiogram has been performed.  Logan [name] 07/26/2012, 12:01 PM

## 2012-07-26 NOTE — Clinical Documentation Improvement (Signed)
RESPIRATORY FAILURE DOCUMENTATION CLARIFICATION QUERY   THIS DOCUMENT IS NOT A PERMANENT PART OF THE MEDICAL RECORD   Please update your documentation within the medical record to reflect your response to this query.                                                                                     07/26/12  Dr. Betti Cruz and/or Associates,  In a better effort to capture your patient's severity of illness, reflect appropriate length of stay and utilization of resources, a review of the patient medical record has revealed the following indicators:  "Upon initial evaluation in the emergency department patient was found to be hypoxic, his oxygen saturation went down to 81% on room air.  ELMAHI,MUTAZ A  07/25/2012, 1:09 PM H&P  Respiratory Rate in ED - mid 20's to mid 30's  Described as being "Tachypneic" by ED Physician  Received Oxygen at 4 liters by nasal cannula  Received Breathing Treatments in ED     Based on your clinical judgment, please document in the progress notes and discharge summary if a condition below provides greater specificity regarding the patient's respiratory status:   - Acute Hypoxic Respiratory Failure   - Other Condition   - Unable to Clinically Determine   In responding to this query please exercise your independent judgment.    The fact that a query is asked, does not imply that any particular answer is desired or expected.       Reviewed: additional documentation in the medical record  Thank You,  Jerral Ralph RN BSN CCDS Certified Clinical Documentation Specialist: Cell   [phone]  Health Information Management [place]  TO RESPOND TO THE THIS QUERY, FOLLOW THE INSTRUCTIONS BELOW:  1. If needed, update documentation for the patient's encounter via the notes activity.  2. Access this query again and click edit on the In [employer].  3. After updating, or not, click F2 to complete all highlighted (required) fields concerning  your review. Select "additional documentation in the medical record" OR "no additional documentation provided".  4. Click Sign note button.  5. The deficiency will fall out of your In Basket *Please let us know if you are not able to complete this workflow by phone or e-mail (listed below).

## 2012-07-26 NOTE — Evaluation (Signed)
Physical Therapy Evaluation Patient Details Name: Logan [name] MRN: [id number] DOB: 1925/07/21 Today's Date: 07/26/2012 Time: 5621-3086 PT Time Calculation (min): 28 min  PT Assessment / Plan / Recommendation Clinical Impression  Pt. s/p bronchitis with decr mobility secondary to decr endurance with desaturation with ambulation.  RT was coming to do breathing treatment as PT finished up.  RT to let nursing know that pt needs to keep O2 on.  (O2 was off on PT arrival).  Will follow patient a few visits to assist with increasing patient 's endurance.        PT Assessment  Patient needs continued PT services    Follow Up Recommendations  No PT follow up;Supervision - Intermittent    Does the patient have the potential to tolerate intense rehabilitation      Barriers to Discharge        Equipment Recommendations  None recommended by PT    Recommendations for Other Services     Frequency Min 3X/week    Precautions / Restrictions Precautions Precautions: None Restrictions Weight Bearing Restrictions: No   Pertinent Vitals/Pain O2 sat to 85% on RA with ambulation, replaced O2 and O2 to 93% on 2L, No pain      Mobility  Bed Mobility Bed Mobility: Rolling Right;Right Sidelying to Sit;Sitting - Scoot to Edge of Bed Rolling Right: 7: Independent Right Sidelying to Sit: 7: Independent Sitting - Scoot to Edge of Bed: 7: Independent Transfers Transfers: Sit to Stand;Stand to Sit Sit to Stand: 7: Independent Stand to Sit: 7: Independent Ambulation/Gait Ambulation/Gait Assistance: 4: Min guard Ambulation Distance (Feet): 165 Feet Assistive device: None Ambulation/Gait Assistance Details: No LOB.  Somewhat slow gait speed.  Pt. needed cues for pursed lip breathing and to take rest breaks as patient desat to 85% on RA with ambulation.   Gait Pattern: Step-through pattern Gait velocity: decreased Stairs: No Wheelchair Mobility Wheelchair Mobility: No              PT  Diagnosis: Generalized weakness  PT Problem List: Decreased activity tolerance;Decreased mobility PT Treatment Interventions: DME instruction;Gait training;Stair training;Functional mobility training;Therapeutic activities;Therapeutic exercise;Balance training;Patient/family education   PT Goals Acute Rehab PT Goals PT Goal Formulation: With patient Time For Goal Achievement: 08/02/12 Potential to Achieve Goals: Good PT Goal: Supine/Side to Sit - Progress: Discontinued (comment) Pt will go Sit to Stand: Independently PT Goal: Sit to Stand - Progress: Goal set today Pt will Ambulate: >150 feet;with modified independence;with least restrictive assistive device PT Goal: Ambulate - Progress: Goal set today Pt will Go Up / Down Stairs: 3-5 stairs;with modified independence;with least restrictive assistive device PT Goal: Up/Down Stairs - Progress: Goal set today  Visit Information  Last PT Received On: 07/26/12 Assistance Needed: +1    Subjective Data  Subjective: "I was mowing my yard up to a month ago." Patient Stated Goal: To go home   Prior Functioning  Home Living Lives With: Spouse Available Help at Discharge: Family;Available 24 hours/day Type of Home: House Home Access: Stairs to enter [employer] of Steps: 5 Entrance Stairs-Rails: Can reach both Home Layout: One level Bathroom Shower/Tub: Engineer, manufacturing systems: Standard (vanity ) Home Adaptive Equipment: Walker - rolling;Walker - four wheeled;Straight cane Prior Function Level of Independence: Independent Able to Take Stairs?: Yes Driving: Yes Vocation: Retired Musician: No difficulties Dominant Hand: Right    Cognition  Overall Cognitive Status: Appears within functional limits for tasks assessed/performed Arousal/Alertness: Awake/alert Orientation Level: Appears intact for tasks assessed Behavior During  Session: [facility] for tasks performed    Extremity/Trunk Assessment  Right Lower Extremity Assessment RLE ROM/Strength/Tone: [facility] for tasks assessed Left Lower Extremity Assessment LLE ROM/Strength/Tone: [facility] for tasks assessed Trunk Assessment Trunk Assessment: Normal   Balance    End of Session PT - End of Session Equipment Utilized During Treatment: Gait belt;Oxygen Activity Tolerance: Patient limited by fatigue Patient left: in chair;with call bell/phone within reach Nurse Communication: Mobility status       INGOLD,Jamirah Zelaya 07/26/2012, 2:06 PM  [facility] Acute Rehabilitation [phone] (pager)

## 2012-07-27 DIAGNOSIS — I5033 Acute on chronic diastolic (congestive) heart failure: Principal | ICD-10-CM

## 2012-07-27 LAB — CBC
HCT: 42.4 % (ref 39.0–52.0)
MCH: 32.8 pg (ref 26.0–34.0)
MCV: 100 fL (ref 78.0–100.0)
RDW: 13.7 % (ref 11.5–15.5)
WBC: 10.4 10*3/uL (ref 4.0–10.5)

## 2012-07-27 LAB — BASIC METABOLIC PANEL
BUN: 30 mg/dL — ABNORMAL HIGH (ref 6–23)
Chloride: 103 mEq/L (ref 96–112)
Creatinine, Ser: 0.96 mg/dL (ref 0.50–1.35)
GFR calc Af Amer: 85 mL/min — ABNORMAL LOW (ref >90)

## 2012-07-27 MED ORDER — POLYETHYLENE GLYCOL 3350 17 G PO PACK: 17.0000 g | PACK | Freq: Every day | ORAL | Status: DC | PRN

## 2012-07-27 MED ORDER — METOPROLOL TARTRATE 100 MG PO TABS
100.0000 mg | ORAL_TABLET | Freq: Two times a day (BID) | ORAL | Status: DC
  Filled 2012-07-27: qty 1

## 2012-07-27 MED ORDER — METOPROLOL TARTRATE 50 MG PO TABS: 100.0000 mg | ORAL_TABLET | Freq: Two times a day (BID) | ORAL | Status: DC

## 2012-07-27 MED ORDER — METOPROLOL TARTRATE 25 MG PO TABS
25.0000 mg | ORAL_TABLET | Freq: Once | ORAL | Status: AC
  Administered 2012-07-27: 25 mg via ORAL
  Filled 2012-07-27 (×2): qty 1

## 2012-07-27 MED ORDER — GUAIFENESIN ER 600 MG PO TB12: 600.0000 mg | ORAL_TABLET | Freq: Two times a day (BID) | ORAL | Status: DC

## 2012-07-27 MED ORDER — WARFARIN SODIUM 3 MG PO TABS
3.0000 mg | ORAL_TABLET | Freq: Once | ORAL | Status: DC
  Filled 2012-07-27: qty 1

## 2012-07-27 MED ORDER — GUAIFENESIN-DM 100-10 MG/5ML PO SYRP: 5.0000 mL | ORAL_SOLUTION | Freq: Three times a day (TID) | ORAL | Status: DC | PRN

## 2012-07-27 MED ORDER — LEVOFLOXACIN 750 MG PO TABS: 750.0000 mg | ORAL_TABLET | ORAL | Status: DC

## 2012-07-27 NOTE — Discharge Summary (Signed)
Physician Discharge Summary  Logan [name] AVW:[id number] DOB: November 21, 1924 DOA: 07/25/2012  PCP: No primary provider on file.  Admit date: 07/25/2012 Discharge date: 07/27/2012  Recommendations for Outpatient Follow-up:  Followup with PCP in 1 week. Followup with your coumadin clinic on 07/30/2012.  Discharge Diagnoses:  Principal Problem:  *Acute bronchitis Active Problems:  Chronic atrial fibrillation  Dilated cardiomyopathy  Hyperlipidemia  Acute on chronic systolic congestive heart failure  Discharge Condition: Stable  Diet recommendation: Heart healthy diet  Filed Weights   07/25/12 1242 07/26/12 0513 07/27/12 0416  Weight: 59.9 kg (132 lb 0.9 oz) 59.5 kg (131 lb 2.8 oz) 59.4 kg (130 lb 15.3 oz)    History of present illness:  Logan [name] is a 78 y.o. male with past medical history of a nonischemic cardiomyopathy, systolic CHF and atrial fibrillation. Patient also had history of laryngeal cancer treated with radiation. Patient came into the hospital because of shortness of breath on 07/25/2012.  Hospital Course:  Acute hypoxic respiratory failure secondary to acute bronchitis  Patient started on azithromycin and ceftriaxone on 07/25/2012, transition to levofloxacin. Continue supportive management with inhaled bronchodilators, mucolytics and oxygen. Urine Legionella negative. Wean oxygen as tolerated. Define 5 more day course of antibiotics to complete 7 day course.   Acute on chronic diastolic CHF  Mild exacerbation, likely related to his acute bronchitis. 2D ECHO on 07/26/2012 showed EF 55-60%. Patient does not have systolic heart failure based on 2D ECHO on 07/26/2012. Has mild bibasilar rhonchi, slightly elevated BNP. Lasix given 40 mg of IV Lasix on 07/25/2012 on admission. Restarted lasix 20 mg daily on 07/26/2012. Pro-BNP improved with some diuresis.   Chronic atrial fibrillation  Rate now well controlled, patient tachycardic at times may be due to albuterol lab.  Increase metoprolol to 100 mg twice daily. Continue digoxin. Continue anticoagulation on Coumadin.   Hypothyroidism  Continue levothyroxine. TSH 0.440 07/25/2012, normal.   Hyperlipidemia  Continue his statin medications.   Procedures: 2D ECHO on 07/26/2012  Study Conclusions - Left ventricle: Technically limited study The cavity size was normal. Wall thickness was increased in a pattern of mild LVH. Systolic function was normal. The estimated ejection fraction was in the range of 55% to 60%. Regional wall motion abnormalities cannot be excluded. - Aorta: Mild dilitation of Sinuses of Valsalva (44mm). - Right ventricle: The cavity size was mildly dilated. Systolic function was mildly reduced. - Right atrium: The atrium was mildly to moderately dilated.  Consultations:  None  Discharge Exam: Filed Vitals:   07/26/12 2221 07/27/12 0416 07/27/12 0849 07/27/12 0948  BP: 123/76 100/69  111/73  Pulse: 98 84  112  Temp:  97.6 F (36.4 C)  97.5 F (36.4 C)  TempSrc:  Oral  Oral  Resp:  19  20  Height:      Weight:  59.4 kg (130 lb 15.3 oz)    SpO2:  91% 91% 92%   Discharge Instructions  Discharge Orders    Future Appointments: Provider: Department: Dept Phone: Center:   08/23/2012 10:15 AM [facility] [phone] None     Future Orders Please Complete By Expires   Diet - low sodium heart healthy      Increase activity slowly      Discharge instructions      Comments:   Followup with PCP in 1 week. Followup with your coumadin clinic on 07/30/2012 and have your PT/INR checked.       Medication List  As of 07/27/2012 10:46 AM    TAKE these medications         CAL-MAG-ZINC PO   Take by mouth daily.      digoxin 0.25 MG tablet   Commonly known as: LANOXIN   Take 0.25 mg by mouth daily.      fish oil-omega-3 fatty acids 1000 MG capsule   Take 1 g by mouth daily.      FLAX SEED OIL PO   Take 1 tablet by mouth daily.       furosemide 20 MG tablet   Commonly known as: LASIX   Take 20 mg by mouth daily.      guaiFENesin 600 MG 12 hr tablet   Commonly known as: MUCINEX   Take 1 tablet (600 mg total) by mouth 2 (two) times daily.      guaiFENesin-dextromethorphan 100-10 MG/5ML syrup   Commonly known as: ROBITUSSIN DM   Take 5 mLs by mouth 3 (three) times daily as needed for cough.      levofloxacin 750 MG tablet   Commonly known as: LEVAQUIN   Take 1 tablet (750 mg total) by mouth daily.      levothyroxine 75 MCG tablet   Commonly known as: SYNTHROID, LEVOTHROID   Take 75 mcg by mouth daily.      metoprolol 50 MG tablet   Commonly known as: LOPRESSOR   Take 2 tablets (100 mg total) by mouth 2 (two) times daily.      multivitamin tablet   Take 1 tablet by mouth daily.      polyethylene glycol packet   Commonly known as: MIRALAX / GLYCOLAX   Take 17 g by mouth daily as needed (Constipation.).      simvastatin 80 MG tablet   Commonly known as: ZOCOR   Take 80 mg by mouth at bedtime.      valsartan 160 MG tablet   Commonly known as: DIOVAN   Take 160 mg by mouth daily.      warfarin 3 MG tablet   Commonly known as: COUMADIN   Take 3-4.5 mg by mouth daily. Takes one and half tablets 4.5 mg every day except Thursdays and take one tablet 3 mg           Follow-up Information    Follow up with Pcp Not In System. Schedule an appointment as soon as possible for a visit in 1 week.      Follow up with Coumadin Clinic. On 07/30/2012. (Please have your PT/INR checked.)           The results of significant diagnostics from this hospitalization (including imaging, microbiology, ancillary and laboratory) are listed below for reference.    Significant Diagnostic Studies: Dg Chest Portable 1 View  07/25/2012  *RADIOLOGY REPORT*  Clinical Data: Shortness of breath.  Congestion.  COPD.  PORTABLE CHEST - 1 VIEW  Comparison: 03/18/2006  Findings: Severe emphysema noted.  Mild airway thickening is  present with accentuated pulmonary interstitium in the lung bases. Given the portable technique, no overt cardiomegaly.  No blunting of the costophrenic angles.  IMPRESSION:  1.  Severe emphysema. 2.  Accentuated interstitium in the lung bases compared the prior. This could be from progressive fibrosis, low-level interstitial edema, drug reaction, or atypical infectious process.   Original Report Authenticated By: Dellia Cloud, M.D.     Microbiology: No results found for this or any previous visit (from the past 240 hour(s)).   Labs: Basic Metabolic Panel:  Lab 07/27/12 0540 07/26/12 0038 07/25/12 0840  NA 143 139 138  K 4.3 4.1 4.8  CL 103 100 101  CO2 33* 31 29  GLUCOSE 98 142* 134*  BUN 30* 23 16  CREATININE 0.96 0.77 0.72  CALCIUM 9.4 9.5 9.7  MG -- -- --  PHOS -- -- --   Liver Function Tests:  Lab 07/25/12 0840  AST 31  ALT 19  ALKPHOS 81  BILITOT 0.8  PROT 7.4  ALBUMIN 3.4*   No results found for this basename: LIPASE:5,AMYLASE:5 in the last 168 hours No results found for this basename: AMMONIA:5 in the last 168 hours CBC:  Lab 07/27/12 0540 07/26/12 0038 07/25/12 0840  WBC 10.4 6.6 9.7  NEUTROABS -- -- 7.3  HGB 13.9 13.9 14.8  HCT 42.4 42.0 44.1  MCV 100.0 98.6 99.8  PLT 234 196 202   Cardiac Enzymes:  Lab 07/26/12 0038 07/25/12 1846 07/25/12 1305  CKTOTAL -- -- --  CKMB -- -- --  CKMBINDEX -- -- --  TROPONINI <0.30 <0.30 <0.30   BNP: BNP (last 3 results)  Basename 07/26/12 0038 07/25/12 0844  PROBNP 1399.0* 2131.0*   CBG: No results found for this basename: GLUCAP:5 in the last 168 hours  Time coordinating discharge: 25 minutes  Signed:  Zackary Mckeone A  [facility] 07/27/2012, 10:46 AM

## 2012-07-27 NOTE — Progress Notes (Signed)
Subjective: Breathing better, weaned off oxygen. Denies any chest pain or shortness of breath.  Objective: Vital signs in last 24 hours: Filed Vitals:   07/26/12 2221 07/27/12 0416 07/27/12 0849 07/27/12 0948  BP: 123/76 100/69  111/73  Pulse: 98 84  112  Temp:  97.6 F (36.4 C)  97.5 F (36.4 C)  TempSrc:  Oral  Oral  Resp:  19  20  Height:      Weight:  59.4 kg (130 lb 15.3 oz)    SpO2:  91% 91% 92%   Weight change: -0.5 kg (-1 lb 1.6 oz)  Intake/Output Summary (Last 24 hours) at 07/27/12 1036 Last data filed at 07/27/12 0850  Gross per 24 hour  Intake    960 ml  Output    400 ml  Net    560 ml    Physical Exam: General: Awake, Oriented, No acute distress. HEENT: EOMI. Neck: Supple CV: S1 and S2 Lungs: Scattered crackles at bases. Abdomen: Soft, Nontender, Nondistended, +bowel sounds. Ext: Good pulses. Trace edema.  Lab Results: Basic Metabolic Panel:  Lab 07/27/12 1610 07/26/12 0038 07/25/12 0840  NA 143 139 138  K 4.3 4.1 4.8  CL 103 100 101  CO2 33* 31 29  GLUCOSE 98 142* 134*  BUN 30* 23 16  CREATININE 0.96 0.77 0.72  CALCIUM 9.4 9.5 9.7  MG -- -- --  PHOS -- -- --   Liver Function Tests:  Lab 07/25/12 0840  AST 31  ALT 19  ALKPHOS 81  BILITOT 0.8  PROT 7.4  ALBUMIN 3.4*   No results found for this basename: LIPASE:5,AMYLASE:5 in the last 168 hours No results found for this basename: AMMONIA:5 in the last 168 hours CBC:  Lab 07/27/12 0540 07/26/12 0038 07/25/12 0840  WBC 10.4 6.6 9.7  NEUTROABS -- -- 7.3  HGB 13.9 13.9 14.8  HCT 42.4 42.0 44.1  MCV 100.0 98.6 99.8  PLT 234 196 202   Cardiac Enzymes:  Lab 07/26/12 0038 07/25/12 1846 07/25/12 1305  CKTOTAL -- -- --  CKMB -- -- --  CKMBINDEX -- -- --  TROPONINI <0.30 <0.30 <0.30   BNP (last 3 results)  Basename 07/26/12 0038 07/25/12 0844  PROBNP 1399.0* 2131.0*   CBG: No results found for this basename: GLUCAP:5 in the last 168 hours No results found for this basename:  HGBA1C:5 in the last 72 hours Other Labs: No components found with this basename: POCBNP:3 No results found for this basename: DDIMER:2 in the last 168 hours  Lab 07/26/12 0038  CHOL 160  HDL 48  LDLCALC 99  TRIG 63  CHOLHDL 3.3  LDLDIRECT --    Lab 07/25/12 1305  TSH 0.440  T4TOTAL --  T3FREE --  FREET4 --  THYROIDAB --   No results found for this basename: VITAMINB12:2,FOLATE:2,FERRITIN:2,TIBC:2,IRON:2,RETICCTPCT:2 in the last 168 hours  Micro Results: No results found for this or any previous visit (from the past 240 hour(s)).  Studies/Results: No results found.  Medications: I have reviewed the patient's current medications. Scheduled Meds:    . atorvastatin  40 mg Oral q1800  . digoxin  0.25 mg Oral Daily  . furosemide  20 mg Oral Daily  . guaiFENesin  1,200 mg Oral BID  . ipratropium  0.5 mg Nebulization TID  . irbesartan  75 mg Oral Daily  . levalbuterol  0.63 mg Nebulization TID  . levofloxacin  750 mg Oral Q24H  . levothyroxine  75 mcg Oral Daily  . metoprolol  100 mg Oral BID  . metoprolol tartrate  25 mg Oral Once  . metoprolol tartrate  25 mg Oral Once  . multivitamin with minerals  1 tablet Oral Daily  . omega-3 acid ethyl esters  1 g Oral Daily  . sodium chloride  3 mL Intravenous Q12H  . warfarin  3 mg Oral Q Thu-1800  . warfarin  3 mg Oral ONCE-1800  . warfarin  4.5 mg Oral Custom  . Warfarin - Pharmacist Dosing Inpatient   Does not apply q1800  . DISCONTD: azithromycin  500 mg Intravenous Q24H  . DISCONTD: cefTRIAXone (ROCEPHIN)  IV  1 g Intravenous Q24H  . DISCONTD: metoprolol  50 mg Oral BID  . DISCONTD: metoprolol  75 mg Oral BID   Continuous Infusions:  PRN Meds:.acetaminophen, acetaminophen, HYDROcodone-acetaminophen, levalbuterol, morphine injection, ondansetron (ZOFRAN) IV, ondansetron, polyethylene glycol, traZODone  2D ECHO on 07/26/2012 Study Conclusions - Left ventricle: Technically limited study The cavity size was normal. Wall  thickness was increased in a pattern of mild LVH. Systolic function was normal. The estimated ejection fraction was in the range of 55% to 60%. Regional wall motion abnormalities cannot be excluded. - Aorta: Mild dilitation of Sinuses of Valsalva (44mm). - Right ventricle: The cavity size was mildly dilated. Systolic function was mildly reduced. - Right atrium: The atrium was mildly to moderately dilated.  Assessment/Plan: Acute hypoxic respiratory failure secondary to acute bronchitis  Patient started on azithromycin and ceftriaxone on 07/25/2012, transition to levofloxacin. Continue supportive management with inhaled bronchodilators, mucolytics and oxygen.  Urine Legionella negative. Wean oxygen as tolerated. Define 5 more day course of antibiotics to complete 7 day course.  Acute on chronic diastolic CHF  Mild exacerbation, likely related to his acute bronchitis. 2D ECHO on 07/26/2012 showed EF 55-60%. Patient does not have systolic heart failure based on 2D ECHO on 07/26/2012. Has mild bibasilar rhonchi, slightly elevated BNP. Lasix given 40 mg of IV Lasix on 07/25/2012 on admission. Restarted lasix 20 mg daily on 07/26/2012. Pro-BNP improved with some diuresis.    Chronic atrial fibrillation  Rate now well controlled, patient tachycardic at times may be due to albuterol lab. Increase metoprolol to 100 mg twice daily.  Continue digoxin.  Continue anticoagulation on Coumadin.   Hypothyroidism  Continue levothyroxine.  TSH 0.440 07/25/2012, normal.  Hyperlipidemia  Continue his statin medications.   Code Status: Full  Family Communication: Discussed with patient.  Disposition Plan: Inpatient, discharge patient today.    LOS: 2 days  Kastiel Simonian A, MD 07/27/2012, 10:36 AM

## 2012-07-27 NOTE — Progress Notes (Signed)
PT Cancellation Note  Patient Details Name: XADRIAN CRAIGHEAD MRN: [id number] DOB: 04-11-1925   Cancelled Treatment:    Reason Eval/Treat Not Completed:  (pt refused)   INGOLD,Chanon Loney 07/27/2012, 12:22 PM [facility] [phone] (pager)

## 2012-08-10 ENCOUNTER — Other Ambulatory Visit: Payer: Self-pay

## 2012-08-10 MED ORDER — VALSARTAN 160 MG PO TABS: 160.0000 mg | ORAL_TABLET | Freq: Two times a day (BID) | ORAL | Status: DC

## 2012-08-13 ENCOUNTER — Ambulatory Visit (INDEPENDENT_AMBULATORY_CARE_PROVIDER_SITE_OTHER): Payer: Medicare Other | Admitting: Cardiology

## 2012-08-13 ENCOUNTER — Encounter: Payer: Self-pay | Admitting: Cardiology

## 2012-08-13 VITALS — BP 114/74 | HR 69 | Ht 70.0 in | Wt 138.0 lb

## 2012-08-13 DIAGNOSIS — I482 Chronic atrial fibrillation, unspecified: Secondary | ICD-10-CM

## 2012-08-13 DIAGNOSIS — I509 Heart failure, unspecified: Secondary | ICD-10-CM

## 2012-08-13 DIAGNOSIS — I4891 Unspecified atrial fibrillation: Secondary | ICD-10-CM

## 2012-08-13 DIAGNOSIS — I5032 Chronic diastolic (congestive) heart failure: Secondary | ICD-10-CM

## 2012-08-13 DIAGNOSIS — E785 Hyperlipidemia, unspecified: Secondary | ICD-10-CM

## 2012-08-13 MED ORDER — METOPROLOL TARTRATE 50 MG PO TABS: 75.0000 mg | ORAL_TABLET | Freq: Two times a day (BID) | ORAL | Status: DC

## 2012-08-13 NOTE — Patient Instructions (Signed)
Continue your current therapy  I will see you again in 6 months.   

## 2012-08-14 NOTE — Progress Notes (Signed)
Logan [name] Date of Birth: 04/30/1925   History of Present Illness: Logan [name] is seen for followup today. He reports he is doing well now. He was recently hospitalized with bronchitis. He was treated with antibiotics. He was also given steroids. He was gently diuresed. During his hospital stay his heart rate increased and his metoprolol dose was increased to 100 mg twice a day. When seen back by his primary care this dose was reduced to 75 mg per day. He required oxygen for about one week after he went home. He reports his breathing is back to normal now. He has no significant cough. He denies any chest pain or palpitations.  Current Outpatient Prescriptions on File Prior to Visit  Medication Sig Dispense Refill  . Calcium-Magnesium-Zinc (CAL-MAG-ZINC PO) Take by mouth daily.       . digoxin (LANOXIN) 0.25 MG tablet Take 0.25 mg by mouth daily.      . fish oil-omega-3 fatty acids 1000 MG capsule Take 1 g by mouth daily.        . Flaxseed, Linseed, (FLAX SEED OIL PO) Take 1 tablet by mouth daily.       . furosemide (LASIX) 20 MG tablet Take 20 mg by mouth daily.      Marland Kitchen levothyroxine (SYNTHROID, LEVOTHROID) 75 MCG tablet Take 75 mcg by mouth daily.      . metoprolol (LOPRESSOR) 50 MG tablet Take 1.5 tablets (75 mg total) by mouth 2 (two) times daily.  90 tablet  11  . Multiple Vitamin (MULTIVITAMIN) tablet Take 1 tablet by mouth daily.       . polyethylene glycol (MIRALAX / GLYCOLAX) packet Take 17 g by mouth daily as needed (Constipation.).  30 each  0  . simvastatin (ZOCOR) 80 MG tablet Take 80 mg by mouth at bedtime.      . valsartan (DIOVAN) 160 MG tablet Take 1 tablet (160 mg total) by mouth 2 (two) times daily.  60 tablet  5  . warfarin (COUMADIN) 3 MG tablet Take 3-4.5 mg by mouth daily. Takes one and half tablets 4.5 mg every day except Thursdays and take one tablet 3 mg      . PROAIR HFA 108 (90 BASE) MCG/ACT inhaler         No Known Allergies  Past Medical History    Diagnosis Date  . Chronic atrial fibrillation   . Dilated cardiomyopathy   . PVC's (premature ventricular contractions)   . Hyperlipidemia   . COPD (chronic obstructive pulmonary disease)   . Hypothyroidism   . Renal calculi   . Chronic anticoagulation   . CHF (congestive heart failure)     DUE TO SYSTOLIC DYSFUNCTION    Past Surgical History  Procedure Date  . Cardiac catheterization 02/26/2007    EF 35-40%  . Appendectomy   . Tonsillectomy and adenoidectomy   . US echocardiography 07/08/2010    EF 50-55%  . US echocardiography 02/16/2007    EF 25-35%  . Transthoracic echocardiogram 08/26/2001    EF 25-30%  . Cardiovascular stress test 02/18/2007    EF 40%    History  Smoking status  . Former Smoker  . Quit date: 07/12/1991  Smokeless tobacco  . Not on file    History  Alcohol Use No    Family History  Problem Relation Age of Onset  . Stroke Mother   . Seizures Mother   . Heart attack Father   . Heart failure Father   . Heart  attack Brother     Review of Systems: As per history of present illness.  All other systems were reviewed and are negative.  Physical Exam: BP 114/74  Pulse 69  Ht 5\' 10"  (1.778 m)  Wt 138 lb (62.596 kg)  BMI 19.80 kg/m2  SpO2 92% The patient is alert and oriented x 3.   The skin is warm and dry.  Color is normal.  The HEENT exam reveals that the sclera are nonicteric.  The mucous membranes are moist.  The carotids are 2+ without bruits.  There is no thyromegaly.  There is no JVD.  The lungs reveal faint crackles.  The chest wall is non tender.  The heart exam reveals an irregular rate with a normal S1 and S2.  There are no murmurs, gallops, or rubs.  The PMI is not displaced.   Abdominal exam reveals good bowel sounds.  There is no hepatosplenomegaly or tenderness.  There are no masses.  Exam of the legs reveal no clubbing, cyanosis, or edema.  Pedal pulses are intact.  Cranial nerves II - XII are intact.  Motor and sensory functions are  intact.  The gait is normal.  LABORATORY DATA:  Assessment / Plan: 1. Atrial fibrillation, permanent. Rate is well controlled today. We will continue on his current dose of metoprolol and digoxin. Continue anticoagulation with Coumadin.  2. History of dilated cardiomyopathy. In 2008 ejection fraction was 25-35%. Repeat echocardiogram this year showed an ejection fraction of 55-60%. He will continue with metoprolol and ARB.  3. Hyperlipidemia. Continue simvastatin and fish oil.

## 2012-08-23 ENCOUNTER — Ambulatory Visit (INDEPENDENT_AMBULATORY_CARE_PROVIDER_SITE_OTHER): Payer: Medicare Other

## 2012-08-23 DIAGNOSIS — I4891 Unspecified atrial fibrillation: Secondary | ICD-10-CM

## 2012-08-23 LAB — POCT INR: INR: 3

## 2012-08-27 ENCOUNTER — Other Ambulatory Visit: Payer: Self-pay | Admitting: Cardiology

## 2012-08-27 MED ORDER — SIMVASTATIN 80 MG PO TABS: 80.0000 mg | ORAL_TABLET | Freq: Every day | ORAL | Status: DC

## 2012-09-13 ENCOUNTER — Other Ambulatory Visit: Payer: Self-pay

## 2012-09-13 MED ORDER — FUROSEMIDE 20 MG PO TABS: 20.0000 mg | ORAL_TABLET | Freq: Every day | ORAL | Status: DC

## 2012-10-04 ENCOUNTER — Ambulatory Visit (INDEPENDENT_AMBULATORY_CARE_PROVIDER_SITE_OTHER): Payer: Medicare Other | Admitting: Pharmacist

## 2012-10-04 ENCOUNTER — Telehealth: Payer: Self-pay | Admitting: Cardiology

## 2012-10-04 DIAGNOSIS — I4891 Unspecified atrial fibrillation: Secondary | ICD-10-CM

## 2012-10-04 LAB — POCT INR: INR: 2.7

## 2012-10-21 ENCOUNTER — Other Ambulatory Visit: Payer: Self-pay

## 2012-10-21 MED ORDER — LEVOTHYROXINE SODIUM 75 MCG PO TABS: 75.0000 ug | ORAL_TABLET | Freq: Every day | ORAL | Status: DC

## 2012-11-15 ENCOUNTER — Ambulatory Visit (INDEPENDENT_AMBULATORY_CARE_PROVIDER_SITE_OTHER): Payer: Medicare Other

## 2012-11-15 DIAGNOSIS — I4891 Unspecified atrial fibrillation: Secondary | ICD-10-CM

## 2012-12-10 ENCOUNTER — Ambulatory Visit (INDEPENDENT_AMBULATORY_CARE_PROVIDER_SITE_OTHER): Payer: Medicare Other | Admitting: Surgery

## 2012-12-10 ENCOUNTER — Encounter (INDEPENDENT_AMBULATORY_CARE_PROVIDER_SITE_OTHER): Payer: Self-pay | Admitting: Surgery

## 2012-12-10 ENCOUNTER — Telehealth: Payer: Self-pay | Admitting: *Deleted

## 2012-12-10 VITALS — BP 110/64 | HR 64 | Temp 97.4°F | Resp 16 | Ht 68.5 in | Wt 140.8 lb

## 2012-12-10 DIAGNOSIS — K4090 Unilateral inguinal hernia, without obstruction or gangrene, not specified as recurrent: Secondary | ICD-10-CM

## 2012-12-10 NOTE — Telephone Encounter (Signed)
Patient called into office, he is having hernia repair 12/28/2012 by Dr Luisa Hart, they have instructed him to stop coumadin 5 days prior to procedure, instructed him last dose of coumadin will be on 12/22/2012, rs his appt, will forward to Dr Sw[place]and for clearance.

## 2012-12-10 NOTE — Patient Instructions (Signed)
Hernia A hernia occurs when an internal organ pushes out through a weak spot in the abdominal wall. Hernias most commonly occur in the groin and around the navel. Hernias often can be pushed back into place (reduced). Most hernias tend to get worse over time. Some abdominal hernias can get stuck in the opening (irreducible or incarcerated hernia) and cannot be reduced. An irreducible abdominal hernia which is tightly squeezed into the opening is at risk for impaired blood supply (strangulated hernia). A strangulated hernia is a medical emergency. Because of the risk for an irreducible or strangulated hernia, surgery may be recommended to repair a hernia. CAUSES   Heavy lifting.  Prolonged coughing.  Straining to have a bowel movement.  A cut (incision) made during an abdominal surgery. HOME CARE INSTRUCTIONS   Bed rest is not required. You may continue your normal activities.  Avoid lifting more than 10 pounds (4.5 kg) or straining.  Cough gently. If you are a smoker it is best to stop. Even the best hernia repair can break down with the continual strain of coughing. Even if you do not have your hernia repaired, a cough will continue to aggravate the problem.  Do not wear anything tight over your hernia. Do not try to keep it in with an outside bandage or truss. These can damage abdominal contents if they are trapped within the hernia sac.  Eat a normal diet.  Avoid constipation. Straining over long periods of time will increase hernia size and encourage breakdown of repairs. If you cannot do this with diet alone, stool softeners may be used. SEEK IMMEDIATE MEDICAL CARE IF:   You have a fever.  You develop increasing abdominal pain.  You feel nauseous or vomit.  Your hernia is stuck outside the abdomen, looks discolored, feels hard, or is tender.  You have any changes in your bowel habits or in the hernia that are unusual for you.  You have increased pain or swelling around the  hernia.  You cannot push the hernia back in place by applying gentle pressure while lying down. MAKE SURE YOU:   Understand these instructions.  Will watch your condition.  Will get help right away if you are not doing well or get worse. Document Released: 10/06/2005 Document Revised: 12/29/2011 Document Reviewed: 05/25/2008 Exit[employer] Patient Information 2013 [employer].  

## 2012-12-10 NOTE — Progress Notes (Signed)
Patient ID: Logan [name], male   DOB: 11/05/1924, 79 y.o.   MRN: [id number]  No chief complaint on file.   HPI Dayton F Sponsel is a 79 y.o. male.  Patient sent at request of Dr. Wilson do to right inguinal hernia. It is mildly symptomatic discomfort and burning. The pain is not severe. The bulge has been present for one month. No associated nausea or vomiting. No change in bowel or bladder function. HPI  Past Medical History  Diagnosis Date  . Chronic atrial fibrillation   . Dilated cardiomyopathy   . PVC's (premature ventricular contractions)   . Hyperlipidemia   . COPD (chronic obstructive pulmonary disease)   . Hypothyroidism   . Renal calculi   . Chronic anticoagulation   . CHF (congestive heart failure)     DUE TO SYSTOLIC DYSFUNCTION    Past Surgical History  Procedure Laterality Date  . Cardiac catheterization  02/26/2007    EF 35-40%  . Appendectomy    . Tonsillectomy and adenoidectomy    . Us echocardiography  07/08/2010    EF 50-55%  . Us echocardiography  02/16/2007    EF 25-35%  . Transthoracic echocardiogram  08/26/2001    EF 25-30%  . Cardiovascular stress test  02/18/2007    EF 40%    Family History  Problem Relation Age of Onset  . Stroke Mother   . Seizures Mother   . Heart attack Father   . Heart failure Father   . Heart attack Brother     Social History History  Substance Use Topics  . Smoking status: Former Smoker    Quit date: 07/12/1991  . Smokeless tobacco: Not on file  . Alcohol Use: No    No Known Allergies  Current Outpatient Prescriptions  Medication Sig Dispense Refill  . Calcium-Magnesium-Zinc (CAL-MAG-ZINC PO) Take by mouth daily.       . digoxin (LANOXIN) 0.25 MG tablet Take 0.25 mg by mouth daily.      . fish oil-omega-3 fatty acids 1000 MG capsule Take 1 g by mouth daily.        . Flaxseed, Linseed, (FLAX SEED OIL PO) Take 1 tablet by mouth daily.       . furosemide (LASIX) 20 MG tablet Take 1 tablet (20 mg total) by  mouth daily.  30 tablet  5  . levothyroxine (SYNTHROID, LEVOTHROID) 75 MCG tablet Take 1 tablet (75 mcg total) by mouth daily.  30 tablet  5  . metoprolol (LOPRESSOR) 50 MG tablet Take 1.5 tablets (75 mg total) by mouth 2 (two) times daily.  90 tablet  11  . Multiple Vitamin (MULTIVITAMIN) tablet Take 1 tablet by mouth daily.       . simvastatin (ZOCOR) 80 MG tablet Take 1 tablet (80 mg total) by mouth at bedtime.  30 tablet  11  . valsartan (DIOVAN) 160 MG tablet Take 1 tablet (160 mg total) by mouth 2 (two) times daily.  60 tablet  5  . warfarin (COUMADIN) 3 MG tablet Take 3-4.5 mg by mouth daily. Takes one and half tablets 4.5 mg every day except Thursdays and take one tablet 3 mg       No current facility-administered medications for this visit.    Review of Systems Review of Systems  Constitutional: Negative.   HENT: Negative.   Eyes: Negative.   Respiratory: Negative.   Cardiovascular: Positive for palpitations.  Gastrointestinal: Negative.   Endocrine: Negative.   Genitourinary: Negative.   Musculoskeletal: Negative.     Skin: Negative.   Allergic/Immunologic: Negative.   Neurological: Negative.   Hematological: Bruises/bleeds easily.  Psychiatric/Behavioral: Negative.     Blood pressure 110/64, pulse 64, temperature 97.4 F (36.3 C), resp. rate 16, height 5' 8.5" (1.74 m), weight 140 lb 12.8 oz (63.866 kg).  Physical Exam Physical Exam  Constitutional: He is oriented to person, place, and time. He appears well-developed and well-nourished.  HENT:  Head: Normocephalic and atraumatic.  Eyes: EOM are normal. Pupils are equal, round, and reactive to light.  Neck: Normal range of motion. Neck supple.  Cardiovascular: A regularly irregular rhythm present.  Abdominal: Soft. Bowel sounds are normal. A hernia is present. Hernia confirmed positive in the right inguinal area. Hernia confirmed negative in the left inguinal area.  Musculoskeletal: Normal range of motion.   Neurological: He is alert and oriented to person, place, and time.  Skin: Skin is warm and dry.  Psychiatric: He has a normal mood and affect. His behavior is normal. Judgment and thought content normal.     Assessment    Right inguinal hernia    Plan    Patient was like to proceed with repair of right inguinal hernia due to pain. Will obtain cardiac clearance. We'll need to hold Coumadin 5 days before surgery.The risk of hernia repair include bleeding,  Infection,   Recurrence of the hernia,  Mesh use, chronic pain,  Organ injury,  Bowel injury,  Bladder injury,   nerve injury with numbness around the incision,  Death,  and worsening of preexisting  medical problems.  The alternatives to surgery have been discussed as well..  Long term expectations of both operative and non operative treatments have been discussed.   The patient agrees to proceed.       Trenita Hulme A. 12/10/2012, 9:46 AM    

## 2012-12-10 NOTE — Telephone Encounter (Signed)
Patient called was told will check with Dr.Jordan next week and call him back.

## 2012-12-13 NOTE — Telephone Encounter (Signed)
Agree, patient cleared for hernia surgery. OK to hold coumadin 5 days.  Henrick Mcgue [place] MD, [facility]

## 2012-12-14 NOTE — Telephone Encounter (Signed)
Notified pt of Dr Illa Level response, routed to Dr Luisa Hart

## 2012-12-14 NOTE — Telephone Encounter (Signed)
Spoke to patient was told ok with Dr.Jordan to hold coumadin 5 days prior to surgery.

## 2012-12-15 ENCOUNTER — Encounter (HOSPITAL_COMMUNITY): Payer: Self-pay | Admitting: Pharmacy Technician

## 2012-12-22 ENCOUNTER — Encounter (HOSPITAL_COMMUNITY)
Admission: RE | Admit: 2012-12-22 | Discharge: 2012-12-22 | Disposition: A | Discharge status: Home / Self Care | Payer: Medicare Other | Source: Ambulatory Visit | Attending: Surgery | Admitting: Surgery

## 2012-12-22 ENCOUNTER — Other Ambulatory Visit (INDEPENDENT_AMBULATORY_CARE_PROVIDER_SITE_OTHER): Payer: Self-pay

## 2012-12-22 ENCOUNTER — Telehealth (INDEPENDENT_AMBULATORY_CARE_PROVIDER_SITE_OTHER): Payer: Self-pay | Admitting: General Surgery

## 2012-12-22 ENCOUNTER — Encounter (HOSPITAL_COMMUNITY): Payer: Self-pay

## 2012-12-22 DIAGNOSIS — R0989 Other specified symptoms and signs involving the circulatory and respiratory systems: Secondary | ICD-10-CM

## 2012-12-22 DIAGNOSIS — Z01818 Encounter for other preprocedural examination: Secondary | ICD-10-CM

## 2012-12-22 DIAGNOSIS — J984 Other disorders of lung: Secondary | ICD-10-CM

## 2012-12-22 DIAGNOSIS — Z01810 Encounter for preprocedural cardiovascular examination: Secondary | ICD-10-CM

## 2012-12-22 HISTORY — DX: Shortness of breath: R06.02

## 2012-12-22 HISTORY — DX: Malignant (primary) neoplasm, unspecified: C80.1

## 2012-12-22 LAB — PROTIME-INR: INR: 2.15 — ABNORMAL HIGH (ref 0.00–1.49)

## 2012-12-22 LAB — APTT: aPTT: 39 seconds — ABNORMAL HIGH (ref 24–37)

## 2012-12-22 LAB — BASIC METABOLIC PANEL
BUN: 17 mg/dL (ref 6–23)
Chloride: 103 mEq/L (ref 96–112)
GFR calc Af Amer: 76 mL/min — ABNORMAL LOW (ref >90)
GFR calc non Af Amer: 65 mL/min — ABNORMAL LOW (ref >90)
Potassium: 4.7 mEq/L (ref 3.5–5.1)

## 2012-12-22 LAB — CBC
HCT: 45.9 % (ref 39.0–52.0)
MCHC: 34.4 g/dL (ref 30.0–36.0)
Platelets: 190 10*3/uL (ref 150–400)
RDW: 12.9 % (ref 11.5–15.5)
WBC: 8.6 10*3/uL (ref 4.0–10.5)

## 2012-12-22 LAB — SURGICAL PCR SCREEN: MRSA, PCR: NEGATIVE

## 2012-12-22 NOTE — Pre-Procedure Instructions (Signed)
Logan [name]  12/22/2012   Your procedure is scheduled on:  Tuesday, March 11th  Report to [facility] at 0530 AM.  Call this number if you have problems the morning of surgery: [phone]   Remember:   Do not eat food or drink liquids after midnight.    Take these medicines the morning of surgery with A SIP OF WATER: Digoxin, Synthroid, Lopressor   Do not wear jewelry.  Do not wear lotions, powders, or perfumes,deodorant.  Do not shave 48 hours prior to surgery. Men may shave face and neck.  Do not bring valuables to the hospital.  Contacts, dentures or bridgework may not be worn into surgery.  Leave suitcase in the car. After surgery it may be brought to your room.  For patients admitted to the hospital, checkout time is 11:00 AM the day of discharge.   Patients discharged the day of surgery will not be allowed to drive home.    Special Instructions: Shower using CHG 2 nights before surgery and the night before surgery.  If you shower the day of surgery use CHG.  Use special wash - you have one bottle of CHG for all showers.  You should use approximately 1/3 of the bottle for each shower.   Please read over the following fact sheets that you were given: Pain Booklet, Coughing and Deep Breathing, MRSA Information and Surgical Site Infection Prevention

## 2012-12-22 NOTE — Telephone Encounter (Signed)
Cobb called to let you know that the chest x-ray are in epic

## 2012-12-22 NOTE — Progress Notes (Addendum)
Primary Physician - Dr. Shelah Lewandowsky [place] Cardiologist - Dr. Swaziland Ekg, echo, cardiac cath all in epic  Patient to stop coumadin tomorrow 12/23/2012

## 2012-12-23 ENCOUNTER — Ambulatory Visit (HOSPITAL_COMMUNITY)
Admission: RE | Admit: 2012-12-23 | Discharge: 2012-12-23 | Disposition: A | Discharge status: Home / Self Care | Payer: Medicare Other | Source: Ambulatory Visit | Attending: Surgery | Admitting: Surgery

## 2012-12-23 ENCOUNTER — Encounter (HOSPITAL_COMMUNITY): Payer: Self-pay

## 2012-12-23 DIAGNOSIS — J438 Other emphysema: Secondary | ICD-10-CM | POA: Insufficient documentation

## 2012-12-23 DIAGNOSIS — J984 Other disorders of lung: Secondary | ICD-10-CM

## 2012-12-23 DIAGNOSIS — R911 Solitary pulmonary nodule: Secondary | ICD-10-CM | POA: Insufficient documentation

## 2012-12-23 DIAGNOSIS — I2510 Atherosclerotic heart disease of native coronary artery without angina pectoris: Secondary | ICD-10-CM | POA: Insufficient documentation

## 2012-12-23 DIAGNOSIS — Q619 Cystic kidney disease, unspecified: Secondary | ICD-10-CM | POA: Insufficient documentation

## 2012-12-23 MED ORDER — IOHEXOL 300 MG/ML  SOLN
80.0000 mL | Freq: Once | INTRAMUSCULAR | Status: AC | PRN
  Administered 2012-12-23: 80 mL via INTRAVENOUS

## 2012-12-23 NOTE — Progress Notes (Signed)
Anesthesia chart review: Patient is an 79 year old male scheduled for repair of right inguinal hernia by Dr. Luisa Hart on/11/14. 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  PCP is Dr. Shelah Lewandowsky.    Cardiologist is Dr. Peter [place], who cleared patient for this procedure with permission to hold Coumadin 5 days before surgery.  EKG on 07/25/12 showed afib with occasional PVCs.  HR was 75 bpm at his PAT visit.  Echo on 07/26/12 showed: - Left ventricle: Technically limited study The cavity size was normal. Wall thickness was increased in a pattern of mild LVH. Systolic function was normal. The estimated ejection fraction was in the range of 55% to 60%. Regional wall motion abnormalities cannot be excluded. - Aorta: Mild dilitation of Sinuses of Valsalva (44mm). - Right ventricle: The cavity size was mildly dilated. Systolic function was mildly reduced. - Tricuspid valve: Mild regurgitation. - Right atrium: The atrium was mildly to moderately dilated.  Cardiac cath on 02/26/07 showed:  1. Minimal nonobstructive atherosclerotic coronary artery disease (LAD minor wall irregularities less than or equal to 10%, LCx monitor irregularities less than 10-15%, RCA focal 20% stenosis).  2. Moderate left ventricular dysfunction, EF 35-40%. 3. Minimal mitral insufficiency.  CXR report on 12/22/12 showed: 1. Advanced changes of COPD as noted by hyperexpansion with flattening of the hemidiaphragms, increased retrosternal air space and pruning of the pulmonary vasculature in the periphery). Importantly, there appears to be a nodular density projecting over the heart on the frontal view (this cannot be localized on the lateral view) which is new compared to prior examinations. Given the advanced smoking related changes in the lungs, the possibility of  underlying neoplasm is of concern, and further evaluation with CT of the thorax (preferably with contrast) is recommended at this time.  2. Extensive atherosclerosis in the thoracic aorta.  3. No radiographic evidence of acute cardiopulmonary disease.   CXR results have been reviewed by Dr. Luisa Hart, and patient underwent a chest CT with contrast (ordered by Dr. Luisa Hart) on 12/23/12 that showed: 1. New pulmonary nodule in the right upper lobe measures 8 mm. If the patient is at high risk for bronchogenic carcinoma, follow-up chest CT at 3-6 months is recommended. If the patient is at low risk for bronchogenic carcinoma, follow-up chest CT at 6-12 months is recommended. This recommendation follows the consensus statement: Guidelines for Management of Small Pulmonary Nodules Detected on CT Scans: A Statement from the Fleischner Society as published in Radiology 2005; 237:395-400.  2. Emphysema.  3. Coronary artery calcifications.  4. Adrenal nodules. Likely benign adenomas.  5. Enlarging cyst arises from the upper pole of the left kidney.  Will defer timing of further CT follow-up to Dr. Luisa Hart and patient's medical physicians.    Preoperative labs noted.  Repeat PT/PTT on arrival.  Patient is closely followed by his cardiologist Dr. Swaziland who cleared patient for this procedure.  His chest CT findings do not indicate need for immediate further evaluation.  No acute cardiopulmonary symptoms were documented at his PAT visit.  He does have chronic DOE.  O2 sats 95% at PAT.  He will be evaluated by his assigned anesthesiologist on the day of surgery.  If no acute cardiopulmonary symptoms, afib remains rate controlled, and PT/PTT felt reasonable then would anticipate he could proceed as planned.  Velna Ochs [facility]/Anesthesiology Phone [phone] 12/23/2012 3:03  PM

## 2012-12-27 MED ORDER — DEXTROSE 5 % IV SOLN
3.0000 g | INTRAVENOUS | Status: AC
  Administered 2012-12-28: 3 g via INTRAVENOUS
  Filled 2012-12-27: qty 3000

## 2012-12-28 ENCOUNTER — Encounter (HOSPITAL_COMMUNITY): Payer: Self-pay | Admitting: Vascular Surgery

## 2012-12-28 ENCOUNTER — Encounter (HOSPITAL_COMMUNITY): Payer: Self-pay | Admitting: Anesthesiology

## 2012-12-28 ENCOUNTER — Ambulatory Visit (HOSPITAL_COMMUNITY): Payer: Medicare Other | Admitting: Anesthesiology

## 2012-12-28 ENCOUNTER — Encounter (HOSPITAL_COMMUNITY)
Admission: RE | Disposition: A | Discharge status: Home / Self Care | Payer: Self-pay | Source: Ambulatory Visit | Attending: Surgery

## 2012-12-28 ENCOUNTER — Ambulatory Visit (HOSPITAL_COMMUNITY)
Admission: RE | Admit: 2012-12-28 | Discharge: 2012-12-28 | Disposition: A | Discharge status: Home / Self Care | Payer: Medicare Other | Source: Ambulatory Visit | Attending: Surgery | Admitting: Surgery

## 2012-12-28 DIAGNOSIS — K4090 Unilateral inguinal hernia, without obstruction or gangrene, not specified as recurrent: Secondary | ICD-10-CM | POA: Insufficient documentation

## 2012-12-28 DIAGNOSIS — I482 Chronic atrial fibrillation, unspecified: Secondary | ICD-10-CM

## 2012-12-28 DIAGNOSIS — J209 Acute bronchitis, unspecified: Secondary | ICD-10-CM

## 2012-12-28 DIAGNOSIS — I5030 Unspecified diastolic (congestive) heart failure: Secondary | ICD-10-CM | POA: Insufficient documentation

## 2012-12-28 DIAGNOSIS — E785 Hyperlipidemia, unspecified: Secondary | ICD-10-CM | POA: Insufficient documentation

## 2012-12-28 DIAGNOSIS — I5032 Chronic diastolic (congestive) heart failure: Secondary | ICD-10-CM

## 2012-12-28 DIAGNOSIS — I420 Dilated cardiomyopathy: Secondary | ICD-10-CM

## 2012-12-28 DIAGNOSIS — I509 Heart failure, unspecified: Secondary | ICD-10-CM | POA: Insufficient documentation

## 2012-12-28 DIAGNOSIS — I4891 Unspecified atrial fibrillation: Secondary | ICD-10-CM | POA: Insufficient documentation

## 2012-12-28 DIAGNOSIS — I428 Other cardiomyopathies: Secondary | ICD-10-CM | POA: Insufficient documentation

## 2012-12-28 DIAGNOSIS — I5033 Acute on chronic diastolic (congestive) heart failure: Secondary | ICD-10-CM

## 2012-12-28 DIAGNOSIS — E039 Hypothyroidism, unspecified: Secondary | ICD-10-CM | POA: Insufficient documentation

## 2012-12-28 DIAGNOSIS — Z7901 Long term (current) use of anticoagulants: Secondary | ICD-10-CM | POA: Insufficient documentation

## 2012-12-28 HISTORY — PX: INGUINAL HERNIA REPAIR: SHX194

## 2012-12-28 LAB — APTT: aPTT: 34 seconds (ref 24–37)

## 2012-12-28 SURGERY — REPAIR, HERNIA, INGUINAL, ADULT
Anesthesia: General | Site: Groin | Laterality: Right | Wound class: Clean

## 2012-12-28 MED ORDER — 0.9 % SODIUM CHLORIDE (POUR BTL) OPTIME
TOPICAL | Status: DC | PRN
  Administered 2012-12-28: 1000 mL

## 2012-12-28 MED ORDER — PROPOFOL 10 MG/ML IV BOLUS
INTRAVENOUS | Status: DC | PRN
  Administered 2012-12-28: 140 mg via INTRAVENOUS

## 2012-12-28 MED ORDER — FENTANYL CITRATE 0.05 MG/ML IJ SOLN
INTRAMUSCULAR | Status: DC | PRN
  Administered 2012-12-28: 50 ug via INTRAVENOUS

## 2012-12-28 MED ORDER — OXYCODONE HCL 5 MG PO TABS: 5.0000 mg | ORAL_TABLET | Freq: Once | ORAL | Status: DC | PRN

## 2012-12-28 MED ORDER — GLYCOPYRROLATE 0.2 MG/ML IJ SOLN
INTRAMUSCULAR | Status: DC | PRN
  Administered 2012-12-28: 0.6 mg via INTRAVENOUS

## 2012-12-28 MED ORDER — OXYCODONE HCL 5 MG/5ML PO SOLN: 5.0000 mg | Freq: Once | ORAL | Status: DC | PRN

## 2012-12-28 MED ORDER — HYDROCODONE-ACETAMINOPHEN 5-325 MG PO TABS: 1.0000 | ORAL_TABLET | Freq: Four times a day (QID) | ORAL | Status: DC | PRN

## 2012-12-28 MED ORDER — ROCURONIUM BROMIDE 100 MG/10ML IV SOLN
INTRAVENOUS | Status: DC | PRN
  Administered 2012-12-28: 40 mg via INTRAVENOUS

## 2012-12-28 MED ORDER — FENTANYL CITRATE 0.05 MG/ML IJ SOLN
INTRAMUSCULAR | Status: AC
  Filled 2012-12-28: qty 2

## 2012-12-28 MED ORDER — LACTATED RINGERS IV SOLN
INTRAVENOUS | Status: DC | PRN
  Administered 2012-12-28: 07:00:00 via INTRAVENOUS

## 2012-12-28 MED ORDER — ONDANSETRON HCL 4 MG/2ML IJ SOLN
INTRAMUSCULAR | Status: DC | PRN
  Administered 2012-12-28: 4 mg via INTRAVENOUS

## 2012-12-28 MED ORDER — CHLORHEXIDINE GLUCONATE 4 % EX LIQD: 1.0000 "application " | Freq: Once | CUTANEOUS | Status: DC

## 2012-12-28 MED ORDER — SODIUM CHLORIDE 0.9 % IV SOLN
10.0000 mg | INTRAVENOUS | Status: DC | PRN
  Administered 2012-12-28: 50 ug/min via INTRAVENOUS

## 2012-12-28 MED ORDER — FENTANYL CITRATE 0.05 MG/ML IJ SOLN
25.0000 ug | INTRAMUSCULAR | Status: DC | PRN
  Administered 2012-12-28: 50 ug via INTRAVENOUS

## 2012-12-28 MED ORDER — ONDANSETRON HCL 4 MG/2ML IJ SOLN: 4.0000 mg | Freq: Four times a day (QID) | INTRAMUSCULAR | Status: DC | PRN

## 2012-12-28 MED ORDER — BUPIVACAINE-EPINEPHRINE 0.25% -1:200000 IJ SOLN
INTRAMUSCULAR | Status: AC
  Filled 2012-12-28: qty 1

## 2012-12-28 MED ORDER — PHENYLEPHRINE HCL 10 MG/ML IJ SOLN
INTRAMUSCULAR | Status: DC | PRN
  Administered 2012-12-28 (×5): 80 ug via INTRAVENOUS

## 2012-12-28 MED ORDER — BUPIVACAINE-EPINEPHRINE 0.25% -1:200000 IJ SOLN
INTRAMUSCULAR | Status: DC | PRN
  Administered 2012-12-28: 50 mL

## 2012-12-28 MED ORDER — LIDOCAINE HCL (CARDIAC) 20 MG/ML IV SOLN
INTRAVENOUS | Status: DC | PRN
  Administered 2012-12-28: 80 mg via INTRAVENOUS

## 2012-12-28 MED ORDER — NEOSTIGMINE METHYLSULFATE 1 MG/ML IJ SOLN
INTRAMUSCULAR | Status: DC | PRN
  Administered 2012-12-28: 3 mg via INTRAVENOUS

## 2012-12-28 SURGICAL SUPPLY — 49 items
ADH SKN CLS APL DERMABOND .7 (GAUZE/BANDAGES/DRESSINGS) ×1
BLADE SURG 10 STRL SS (BLADE) ×2 IMPLANT
BLADE SURG 15 STRL LF DISP TIS (BLADE) ×1 IMPLANT
BLADE SURG 15 STRL SS (BLADE) ×2
BLADE SURG ROTATE 9660 (MISCELLANEOUS) IMPLANT
CANISTER SUCTION 2500CC (MISCELLANEOUS) IMPLANT
CHLORAPREP W/TINT 26ML (MISCELLANEOUS) ×2 IMPLANT
CLOTH BEACON ORANGE TIMEOUT ST (SAFETY) ×2 IMPLANT
COVER SURGICAL LIGHT HANDLE (MISCELLANEOUS) ×2 IMPLANT
DECANTER SPIKE VIAL GLASS SM (MISCELLANEOUS) ×2 IMPLANT
DERMABOND ADVANCED (GAUZE/BANDAGES/DRESSINGS) ×1
DERMABOND ADVANCED .7 DNX12 (GAUZE/BANDAGES/DRESSINGS) ×1 IMPLANT
DRAIN PENROSE 1/2X12 LTX STRL (WOUND CARE) ×1 IMPLANT
DRAPE LAPAROTOMY TRNSV 102X78 (DRAPE) ×2 IMPLANT
DRAPE UTILITY 15X26 W/TAPE STR (DRAPE) ×4 IMPLANT
ELECT CAUTERY BLADE 6.4 (BLADE) ×2 IMPLANT
ELECT REM PT RETURN 9FT ADLT (ELECTROSURGICAL) ×2
ELECTRODE REM PT RTRN 9FT ADLT (ELECTROSURGICAL) ×1 IMPLANT
GLOVE BIO SURGEON STRL SZ8 (GLOVE) ×2 IMPLANT
GLOVE BIOGEL PI IND STRL 8 (GLOVE) ×1 IMPLANT
GLOVE BIOGEL PI INDICATOR 8 (GLOVE) ×1
GOWN STRL NON-REIN LRG LVL3 (GOWN DISPOSABLE) ×4 IMPLANT
GOWN STRL REIN XL XLG (GOWN DISPOSABLE) ×2 IMPLANT
KIT BASIN OR (CUSTOM PROCEDURE TRAY) ×2 IMPLANT
KIT ROOM TURNOVER OR (KITS) ×2 IMPLANT
MESH HERNIA SYS ULTRAPRO LRG (Mesh General) ×2 IMPLANT
NEEDLE HYPO 25GX1X1/2 BEV (NEEDLE) ×2 IMPLANT
NS IRRIG 1000ML POUR BTL (IV SOLUTION) ×2 IMPLANT
PACK SURGICAL SETUP 50X90 (CUSTOM PROCEDURE TRAY) ×2 IMPLANT
PAD ARMBOARD 7.5X6 YLW CONV (MISCELLANEOUS) ×2 IMPLANT
PENCIL BUTTON HOLSTER BLD 10FT (ELECTRODE) ×2 IMPLANT
SPONGE LAP 18X18 X RAY DECT (DISPOSABLE) ×2 IMPLANT
SUT MNCRL AB 4-0 PS2 18 (SUTURE) ×2 IMPLANT
SUT NOVA 0 T19/GS 22DT (SUTURE) IMPLANT
SUT NOVA NAB DX-16 0-1 5-0 T12 (SUTURE) ×4 IMPLANT
SUT SILK 2 0 SH (SUTURE) IMPLANT
SUT VIC AB 0 CT1 27 (SUTURE) ×2
SUT VIC AB 0 CT1 27XBRD ANBCTR (SUTURE) ×1 IMPLANT
SUT VIC AB 2-0 SH 27 (SUTURE) ×2
SUT VIC AB 2-0 SH 27X BRD (SUTURE) ×1 IMPLANT
SUT VIC AB 3-0 SH 18 (SUTURE) ×4 IMPLANT
SUT VICRYL AB 3 0 TIES (SUTURE) ×2 IMPLANT
SYR BULB 3OZ (MISCELLANEOUS) ×2 IMPLANT
SYR CONTROL 10ML LL (SYRINGE) ×2 IMPLANT
TOWEL OR 17X24 6PK STRL BLUE (TOWEL DISPOSABLE) ×2 IMPLANT
TOWEL OR 17X26 10 PK STRL BLUE (TOWEL DISPOSABLE) ×2 IMPLANT
TUBE CONNECTING 12X1/4 (SUCTIONS) ×2 IMPLANT
WATER STERILE IRR 1000ML POUR (IV SOLUTION) IMPLANT
YANKAUER SUCT BULB TIP NO VENT (SUCTIONS) ×1 IMPLANT

## 2012-12-28 NOTE — Interval H&P Note (Signed)
History and Physical Interval Note:  12/28/2012 7:06 AM  Logan [name]  has presented today for surgery, with the diagnosis of right inguinal hernia  The various methods of treatment have been discussed with the patient and family. After consideration of risks, benefits and other options for treatment, the patient has consented to  Procedure(s): HERNIA REPAIR INGUINAL ADULT (Right) as a surgical intervention .  The patient's history has been reviewed, patient examined, no change in status, stable for surgery.  I have reviewed the patient's chart and labs.  Questions were answered to the patient's satisfaction.     Rosland Riding A.

## 2012-12-28 NOTE — Anesthesia Preprocedure Evaluation (Addendum)
Anesthesia Evaluation  Patient identified by MRN, date of birth, ID band Patient awake    Reviewed: Allergy & Precautions, H&P , NPO status , Patient's Chart, lab work & pertinent test results, reviewed documented beta blocker date and time   Airway Mallampati: II  Neck ROM: full    Dental  (+) Dental Advidsory Given   Pulmonary shortness of breath and with exertion, COPDformer smoker,          Cardiovascular +CHF + dysrhythmias Atrial Fibrillation     Neuro/Psych    GI/Hepatic   Endo/Other  Hypothyroidism   Renal/GU      Musculoskeletal   Abdominal   Peds  Hematology   Anesthesia Other Findings   Reproductive/Obstetrics                          Anesthesia Physical Anesthesia Plan  ASA: III  Anesthesia Plan: General   Post-op Pain Management:    Induction: Intravenous  Airway Management Planned: LMA  Additional Equipment:   Intra-op Plan:   Post-operative Plan:   Informed Consent: I have reviewed the patients History and Physical, chart, labs and discussed the procedure including the risks, benefits and alternatives for the proposed anesthesia with the patient or authorized representative who has indicated his/her understanding and acceptance.   Dental Advisory Given  Plan Discussed with: Surgeon, CRNA and Anesthesiologist  Anesthesia Plan Comments:        Anesthesia Quick Evaluation

## 2012-12-28 NOTE — H&P (View-Only) (Signed)
Patient ID: Logan [name], male   DOB: 1925-05-31, 79 y.o.   MRN: [id number]  No chief complaint on file.   HPI Logan [name] is a 79 y.o. male.  Patient sent at request of Dr. Andrey Campanile do to right inguinal hernia. It is mildly symptomatic discomfort and burning. The pain is not severe. The bulge has been present for one month. No associated nausea or vomiting. No change in bowel or bladder function. HPI  Past Medical History  Diagnosis Date  . Chronic atrial fibrillation   . Dilated cardiomyopathy   . PVC's (premature ventricular contractions)   . Hyperlipidemia   . COPD (chronic obstructive pulmonary disease)   . Hypothyroidism   . Renal calculi   . Chronic anticoagulation   . CHF (congestive heart failure)     DUE TO SYSTOLIC DYSFUNCTION    Past Surgical History  Procedure Laterality Date  . Cardiac catheterization  02/26/2007    EF 35-40%  . Appendectomy    . Tonsillectomy and adenoidectomy    . US echocardiography  07/08/2010    EF 50-55%  . US echocardiography  02/16/2007    EF 25-35%  . Transthoracic echocardiogram  08/26/2001    EF 25-30%  . Cardiovascular stress test  02/18/2007    EF 40%    Family History  Problem Relation Age of Onset  . Stroke Mother   . Seizures Mother   . Heart attack Father   . Heart failure Father   . Heart attack Brother     Social History History  Substance Use Topics  . Smoking status: Former Smoker    Quit date: 07/12/1991  . Smokeless tobacco: Not on file  . Alcohol Use: No    No Known Allergies  Current Outpatient Prescriptions  Medication Sig Dispense Refill  . Calcium-Magnesium-Zinc (CAL-MAG-ZINC PO) Take by mouth daily.       . digoxin (LANOXIN) 0.25 MG tablet Take 0.25 mg by mouth daily.      . fish oil-omega-3 fatty acids 1000 MG capsule Take 1 g by mouth daily.        . Flaxseed, Linseed, (FLAX SEED OIL PO) Take 1 tablet by mouth daily.       . furosemide (LASIX) 20 MG tablet Take 1 tablet (20 mg total) by  mouth daily.  30 tablet  5  . levothyroxine (SYNTHROID, LEVOTHROID) 75 MCG tablet Take 1 tablet (75 mcg total) by mouth daily.  30 tablet  5  . metoprolol (LOPRESSOR) 50 MG tablet Take 1.5 tablets (75 mg total) by mouth 2 (two) times daily.  90 tablet  11  . Multiple Vitamin (MULTIVITAMIN) tablet Take 1 tablet by mouth daily.       . simvastatin (ZOCOR) 80 MG tablet Take 1 tablet (80 mg total) by mouth at bedtime.  30 tablet  11  . valsartan (DIOVAN) 160 MG tablet Take 1 tablet (160 mg total) by mouth 2 (two) times daily.  60 tablet  5  . warfarin (COUMADIN) 3 MG tablet Take 3-4.5 mg by mouth daily. Takes one and half tablets 4.5 mg every day except Thursdays and take one tablet 3 mg       No current facility-administered medications for this visit.    Review of Systems Review of Systems  Constitutional: Negative.   HENT: Negative.   Eyes: Negative.   Respiratory: Negative.   Cardiovascular: Positive for palpitations.  Gastrointestinal: Negative.   Endocrine: Negative.   Genitourinary: Negative.   Musculoskeletal: Negative.  Skin: Negative.   Allergic/Immunologic: Negative.   Neurological: Negative.   Hematological: Bruises/bleeds easily.  Psychiatric/Behavioral: Negative.     Blood pressure 110/64, pulse 64, temperature 97.4 F (36.3 C), resp. rate 16, height 5' 8.5" (1.74 m), weight 140 lb 12.8 oz (63.866 kg).  Physical Exam Physical Exam  Constitutional: He is oriented to person, place, and time. He appears well-developed and well-nourished.  HENT:  Head: Normocephalic and atraumatic.  Eyes: EOM are normal. Pupils are equal, round, and reactive to light.  Neck: Normal range of motion. Neck supple.  Cardiovascular: A regularly irregular rhythm present.  Abdominal: Soft. Bowel sounds are normal. A hernia is present. Hernia confirmed positive in the right inguinal area. Hernia confirmed negative in the left inguinal area.  Musculoskeletal: Normal range of motion.   Neurological: He is alert and oriented to person, place, and time.  Skin: Skin is warm and dry.  Psychiatric: He has a normal mood and affect. His behavior is normal. Judgment and thought content normal.     Assessment    Right inguinal hernia    Plan    Patient was like to proceed with repair of right inguinal hernia due to pain. Will obtain cardiac clearance. We'll need to hold Coumadin 5 days before surgery.The risk of hernia repair include bleeding,  Infection,   Recurrence of the hernia,  Mesh use, chronic pain,  Organ injury,  Bowel injury,  Bladder injury,   nerve injury with numbness around the incision,  Death,  and worsening of preexisting  medical problems.  The alternatives to surgery have been discussed as well..  Long term expectations of both operative and non operative treatments have been discussed.   The patient agrees to proceed.       Marvia Troost A. 12/10/2012, 9:46 AM

## 2012-12-28 NOTE — Transfer of Care (Signed)
Immediate Anesthesia Transfer of Care Note  Patient: Logan [name]  Procedure(s) Performed: Procedure(s): HERNIA REPAIR INGUINAL ADULT (Right)  Patient Location: PACU  Anesthesia Type:General  Level of Consciousness: awake, alert  and oriented  Airway & Oxygen Therapy: Patient Spontanous Breathing and Patient connected to nasal cannula oxygen  Post-op Assessment: Report given to PACU RN, Post -op Vital signs reviewed and stable and Patient moving all extremities X 4  Post vital signs: Reviewed and stable  Complications: No apparent anesthesia complications

## 2012-12-28 NOTE — Anesthesia Procedure Notes (Signed)
Procedure Name: Intubation Date/Time: 12/28/2012 7:30 AM Performed by: Carmela Rima Pre-anesthesia Checklist: Patient identified, Timeout performed, Emergency Drugs available, Suction available and Patient being monitored Patient Re-evaluated:Patient Re-evaluated prior to inductionOxygen Delivery Method: Circle system utilized Preoxygenation: Pre-oxygenation with 100% oxygen Intubation Type: IV induction Ventilation: Mask ventilation without difficulty Laryngoscope Size: Mac and 3 Grade View: Grade I Tube size: 7.5 mm Number of attempts: 1 Placement Confirmation: ETT inserted through vocal cords under direct vision,  positive ETCO2 and breath sounds checked- equal and bilateral Secured at: 22 cm Tube secured with: Tape Dental Injury: Teeth and Oropharynx as per pre-operative assessment

## 2012-12-28 NOTE — Op Note (Signed)
Right Inguinal Hernia, Open, with mesh Procedure Note  Indications: The patient presented with a history of a right, reducible  Inguinal hernia.  The risk of hernia repair include bleeding,  Infection,   Recurrence of the hernia,  Mesh use, chronic pain,  Organ injury,  Bowel injury,  Bladder injury,   nerve injury with numbness around the incision,  Death,  and worsening of preexisting  medical problems.  The alternatives to surgery have been discussed as well..  Long term expectations of both operative and non operative treatments have been discussed.   The patient agrees to proceed.  Pre-operative Diagnosis: right reducible inguinal hernia  Post-operative Diagnosis: same (indirect)   Surgeon: Harriette Bouillon A.   Assistants: OR staff  Anesthesia: General endotracheal anesthesia and Local anesthesia 0.25.% bupivacaine, with epinephrine  ASA Class: 3  Procedure Details  The patient was seen again in the Holding Room. The risks, benefits, complications, treatment options, and expected outcomes were discussed with the patient. The possibilities of reaction to medication, pulmonary aspiration, perforation of viscus, bleeding, recurrent infection, the need for additional procedures, and development of a complication requiring transfusion or further operation were discussed with the patient and/or family. There was concurrence with the proposed plan, and informed consent was obtained. The site of surgery was properly noted/marked. The patient was taken to the Operating Room, identified as Logan [name], and the procedure verified as hernia repair. A Time Out was held and the above information confirmed.  The patient was placed in the supine position and underwent induction of anesthesia, the lower abdomen and groin was prepped and draped in the standard fashion, and 0.25% Marcaine with epinephrine was used to anesthetize the skin over the mid-portion of the inguinal canal. A transverse incision  was made. Dissection was carried through the soft tissue to expose the inguinal canal and inguinal ligament along its lower edge. The external oblique fascia was split along the course of its fibers, exposing the inguinal canal. The cord and nerve were looped using a Penrose drain and reflected out of the field. The defect was exposed and a piece of prolene hernia system ultrapro mesh was and placed into  the indirect defect after sac and lipoma reduced into the  defect. Interupted 0 novafil and 0 vicryl  suture was then used  to repair the defect, with the suture being sewn from the pubic tubercle inferiorly and superiorly along the canal to a level just beyond the internal ring. The mesh was split to allow passage of the cord and nerve into the canal without entrapment. The slit was closed with O novafil.   The contents were then returned to canal and the external oblique fashion was then closed in a continuous fashion using 3-0 Vicryl suture taking care not to cause entrapment. Scarpa's layer closed with 3 0 vicryl and 4 0 monocryl used to close the skin.  Dermabond used for dressing.  Instrument, sponge, and needle counts were correct prior to closure and at the conclusion of the case.  Findings: Hernia as above  Estimated Blood Loss: Minimal         Drains: None         Total IV Fluids: 800 mL         Specimens: none               Complications: None; patient tolerated the procedure well.         Disposition: PACU - hemodynamically stable.  Condition: stable

## 2012-12-28 NOTE — Anesthesia Postprocedure Evaluation (Signed)
Anesthesia Post Note  Patient: Logan [name]  Procedure(s) Performed: Procedure(s) (LRB): HERNIA REPAIR INGUINAL ADULT (Right)  Anesthesia type: General  Patient location: PACU  Post pain: Pain level controlled and Adequate analgesia  Post assessment: Post-op Vital signs reviewed, Patient's Cardiovascular Status Stable, Respiratory Function Stable, Patent Airway and Pain level controlled  Last Vitals:  Filed Vitals:   12/28/12 0845  BP: 129/85  Pulse: 69  Temp:   Resp: 12    Post vital signs: Reviewed and stable  Level of consciousness: awake, alert  and oriented  Complications: No apparent anesthesia complications

## 2013-01-01 ENCOUNTER — Encounter (HOSPITAL_COMMUNITY): Payer: Self-pay | Admitting: Surgery

## 2013-01-07 ENCOUNTER — Ambulatory Visit (INDEPENDENT_AMBULATORY_CARE_PROVIDER_SITE_OTHER): Payer: Medicare Other

## 2013-01-07 DIAGNOSIS — I4891 Unspecified atrial fibrillation: Secondary | ICD-10-CM

## 2013-01-14 ENCOUNTER — Encounter (INDEPENDENT_AMBULATORY_CARE_PROVIDER_SITE_OTHER): Payer: Self-pay | Admitting: Surgery

## 2013-01-14 ENCOUNTER — Ambulatory Visit (INDEPENDENT_AMBULATORY_CARE_PROVIDER_SITE_OTHER): Payer: Medicare Other | Admitting: Surgery

## 2013-01-14 VITALS — BP 126/76 | HR 78 | Temp 97.8°F | Resp 18 | Ht 71.0 in | Wt 137.0 lb

## 2013-01-14 DIAGNOSIS — R911 Solitary pulmonary nodule: Secondary | ICD-10-CM | POA: Insufficient documentation

## 2013-01-14 DIAGNOSIS — Z9889 Other specified postprocedural states: Secondary | ICD-10-CM

## 2013-01-14 NOTE — Patient Instructions (Signed)
Will schedule CT chest for 6 months out.  Return as needed.  Resume full activity.

## 2013-01-14 NOTE — Progress Notes (Signed)
Pt returns today after right inguinal  hernia repair.  Pain is well controlled.  Bowels are functioning.  Wound is clean. History of 8 mm pulmonary nodule on CT.  Recommendation of follow up CT in 6 months.   On exam:  Incision is clean /dry/intact.  Area is soft without signs of hernia recurrence.  Impression:  Status repair of hernia  Plan:  RTC PRN  Repeat CT chest in 6 months to follow up pulmonary nodule.Logan [name]

## 2013-02-07 ENCOUNTER — Telehealth (INDEPENDENT_AMBULATORY_CARE_PROVIDER_SITE_OTHER): Payer: Self-pay | Admitting: General Surgery

## 2013-02-07 NOTE — Telephone Encounter (Signed)
Patient is aware of appt on 06/27/13 10:15 at [address] he is to have labs before scan he was also instructed no solids 4 hr prior to test

## 2013-02-14 ENCOUNTER — Ambulatory Visit (INDEPENDENT_AMBULATORY_CARE_PROVIDER_SITE_OTHER): Payer: Medicare Other | Admitting: Cardiology

## 2013-02-14 ENCOUNTER — Encounter: Payer: Self-pay | Admitting: Cardiology

## 2013-02-14 ENCOUNTER — Ambulatory Visit (INDEPENDENT_AMBULATORY_CARE_PROVIDER_SITE_OTHER): Payer: Medicare Other

## 2013-02-14 VITALS — BP 112/60 | HR 51 | Ht 71.0 in | Wt 137.8 lb

## 2013-02-14 DIAGNOSIS — Z7901 Long term (current) use of anticoagulants: Secondary | ICD-10-CM

## 2013-02-14 DIAGNOSIS — E785 Hyperlipidemia, unspecified: Secondary | ICD-10-CM

## 2013-02-14 DIAGNOSIS — I4891 Unspecified atrial fibrillation: Secondary | ICD-10-CM

## 2013-02-14 DIAGNOSIS — I420 Dilated cardiomyopathy: Secondary | ICD-10-CM

## 2013-02-14 DIAGNOSIS — I428 Other cardiomyopathies: Secondary | ICD-10-CM

## 2013-02-14 DIAGNOSIS — I482 Chronic atrial fibrillation, unspecified: Secondary | ICD-10-CM

## 2013-02-14 DIAGNOSIS — I509 Heart failure, unspecified: Secondary | ICD-10-CM

## 2013-02-14 DIAGNOSIS — I5032 Chronic diastolic (congestive) heart failure: Secondary | ICD-10-CM

## 2013-02-14 LAB — POCT INR: INR: 3

## 2013-02-14 MED ORDER — WARFARIN SODIUM 3 MG PO TABS: 3.0000 mg | ORAL_TABLET | Freq: Every day | ORAL | Status: DC

## 2013-02-14 MED ORDER — METOPROLOL TARTRATE 50 MG PO TABS: 75.0000 mg | ORAL_TABLET | Freq: Two times a day (BID) | ORAL | Status: DC

## 2013-02-14 MED ORDER — VALSARTAN 160 MG PO TABS: 160.0000 mg | ORAL_TABLET | Freq: Two times a day (BID) | ORAL | Status: DC

## 2013-02-14 MED ORDER — FUROSEMIDE 20 MG PO TABS: 20.0000 mg | ORAL_TABLET | Freq: Every day | ORAL | Status: DC

## 2013-02-14 MED ORDER — SIMVASTATIN 80 MG PO TABS: 80.0000 mg | ORAL_TABLET | Freq: Every day | ORAL | Status: DC

## 2013-02-14 NOTE — Patient Instructions (Signed)
Continue your current therapy  I will see you in 6 months.   

## 2013-02-14 NOTE — Progress Notes (Signed)
Logan [name] Date of Birth: 1925/09/23   History of Present Illness: Logan [name] is seen for followup today. He reports that he is doing well. His wife states that he had a little bit of a cough this winter but it is now resolved. He did undergo surgical repair of a right inguinal hernia in March. He is now back on his Coumadin. On his chest x-ray he was noted to have a nodule on his lung. CT showed an 8 mm nodule and he is scheduled for a repeat CT scan in September. He currently complains of shortness of breath only when he is doing too much or when he bends over.  Current Outpatient Prescriptions on File Prior to Visit  Medication Sig Dispense Refill  . Calcium-Magnesium-Zinc (CAL-MAG-ZINC PO) Take 1 tablet by mouth daily.       . digoxin (LANOXIN) 0.25 MG tablet Take 0.25 mg by mouth daily.      . fish oil-omega-3 fatty acids 1000 MG capsule Take 1 g by mouth daily.       . Flaxseed, Linseed, (FLAX SEED OIL PO) Take 1 tablet by mouth daily.       Marland Kitchen HYDROcodone-acetaminophen (NORCO) 5-325 MG per tablet Take 1 tablet by mouth every 6 (six) hours as needed for pain.  30 tablet  0  . levothyroxine (SYNTHROID, LEVOTHROID) 75 MCG tablet Take 1 tablet (75 mcg total) by mouth daily.  30 tablet  5  . Multiple Vitamin (MULTIVITAMIN) tablet Take 1 tablet by mouth daily.        No current facility-administered medications on file prior to visit.    No Known Allergies  Past Medical History  Diagnosis Date  . Chronic atrial fibrillation   . Dilated cardiomyopathy   . PVC's (premature ventricular contractions)   . Hyperlipidemia   . COPD (chronic obstructive pulmonary disease)   . Hypothyroidism   . Renal calculi   . Chronic anticoagulation   . CHF (congestive heart failure)     DUE TO SYSTOLIC DYSFUNCTION  . Shortness of breath     exertional  . Cancer     vocal cord - radiation     Past Surgical History  Procedure Laterality Date  . Cardiac catheterization  02/26/2007    EF  35-40%  . Appendectomy    . Tonsillectomy and adenoidectomy    . US echocardiography  07/08/2010    EF 50-55%  . US echocardiography  02/16/2007    EF 25-35%  . Transthoracic echocardiogram  08/26/2001    EF 25-30%  . Cardiovascular stress test  02/18/2007    EF 40%  . Microlaryngoscopy with co2 laser and excision of vocal cord lesion      15 years ago  . Lithotripsy    . Inguinal hernia repair Right 12/28/2012    Procedure: HERNIA REPAIR INGUINAL ADULT;  Surgeon: Clovis Pu. Cornett, MD;  Location: [facility] OR;  Service: General;  Laterality: Right;    History  Smoking status  . Former Smoker  . Quit date: 07/12/1991  Smokeless tobacco  . Not on file    History  Alcohol Use No    Family History  Problem Relation Age of Onset  . Stroke Mother   . Seizures Mother   . Heart attack Father   . Heart failure Father   . Heart attack Brother     Review of Systems: As per history of present illness.  All other systems were reviewed and are negative.  Physical  Exam: BP 112/60  Pulse 51  Ht 5\' 11"  (1.803 m)  Wt 137 lb 12.8 oz (62.506 kg)  BMI 19.23 kg/m2  SpO2 97% The patient is alert and oriented x 3.   The skin is warm and dry.  Color is normal.  The HEENT exam is normal. The carotids are 2+ without bruits.  There is no thyromegaly.  There is no JVD.  The lungs are clear.  The heart exam reveals an irregular rate with a normal S1 and S2.  There are no murmurs, gallops, or rubs.  The PMI is not displaced.   Abdominal exam reveals good bowel sounds.  There is no hepatosplenomegaly or tenderness.    Exam of the legs reveal no clubbing, cyanosis, or edema.  Pedal pulses are intact.  Cranial nerves II - XII are intact.  Motor and sensory functions are intact.  The gait is normal.  LABORATORY DATA: Lab Results  Component Value Date   WBC 8.6 12/22/2012   HGB 15.8 12/22/2012   HCT 45.9 12/22/2012   PLT 190 12/22/2012   GLUCOSE 96 12/22/2012   CHOL 160 07/26/2012   TRIG 63 07/26/2012   HDL 48  07/26/2012   LDLCALC 99 07/26/2012   ALT 19 07/25/2012   AST 31 07/25/2012   NA 142 12/22/2012   K 4.7 12/22/2012   CL 103 12/22/2012   CREATININE 1.01 12/22/2012   BUN 17 12/22/2012   CO2 34* 12/22/2012   TSH 0.440 07/25/2012   INR 3.0 02/14/2013    Assessment / Plan: 1. Atrial fibrillation, permanent. Rate is well controlled today. We will continue on his current dose of metoprolol and digoxin. Continue anticoagulation with Coumadin.  2. History of dilated cardiomyopathy. In 2008 ejection fraction was 25-35%. Repeat echocardiogram October 2013 showed an ejection fraction of 55-60%. He will continue with metoprolol and ARB.  3. Hyperlipidemia. Continue simvastatin and fish oil.

## 2013-02-17 ENCOUNTER — Other Ambulatory Visit: Payer: Self-pay | Admitting: Cardiology

## 2013-02-18 ENCOUNTER — Other Ambulatory Visit: Payer: Self-pay | Admitting: *Deleted

## 2013-02-18 MED ORDER — METOPROLOL TARTRATE 50 MG PO TABS: 75.0000 mg | ORAL_TABLET | Freq: Two times a day (BID) | ORAL | Status: DC

## 2013-02-18 NOTE — Telephone Encounter (Signed)
Patient calling to have rx's corrected.  Metoprolol Tartrate 50mg  1.5 tablets twice daily. #90, 11RF  He wants warfarin 30 day not 90. Will send this to [facility].

## 2013-03-28 ENCOUNTER — Ambulatory Visit (INDEPENDENT_AMBULATORY_CARE_PROVIDER_SITE_OTHER): Payer: Medicare Other | Admitting: *Deleted

## 2013-03-28 DIAGNOSIS — I4891 Unspecified atrial fibrillation: Secondary | ICD-10-CM

## 2013-03-28 LAB — POCT INR: INR: 2.2

## 2013-04-21 ENCOUNTER — Other Ambulatory Visit: Payer: Self-pay | Admitting: Cardiology

## 2013-05-09 ENCOUNTER — Ambulatory Visit (INDEPENDENT_AMBULATORY_CARE_PROVIDER_SITE_OTHER): Payer: Medicare Other | Admitting: *Deleted

## 2013-05-09 DIAGNOSIS — I4891 Unspecified atrial fibrillation: Secondary | ICD-10-CM

## 2013-05-30 ENCOUNTER — Telehealth: Payer: Self-pay | Admitting: Cardiology

## 2013-05-30 NOTE — Telephone Encounter (Signed)
Explained to patient this was a script by Dr [place] for a 90 day supply with 3 refills.

## 2013-05-30 NOTE — Telephone Encounter (Signed)
New Prob     Pt states the quanity of his WARFARIN is incorrect and would like to speak to the nurse regarding this. Please call.

## 2013-06-13 ENCOUNTER — Ambulatory Visit (INDEPENDENT_AMBULATORY_CARE_PROVIDER_SITE_OTHER): Payer: Medicare Other | Admitting: *Deleted

## 2013-06-13 DIAGNOSIS — I4891 Unspecified atrial fibrillation: Secondary | ICD-10-CM

## 2013-06-27 ENCOUNTER — Other Ambulatory Visit (INDEPENDENT_AMBULATORY_CARE_PROVIDER_SITE_OTHER): Payer: Self-pay

## 2013-06-27 ENCOUNTER — Ambulatory Visit
Admission: RE | Admit: 2013-06-27 | Discharge: 2013-06-27 | Disposition: A | Discharge status: Home / Self Care | Payer: Medicare Other | Source: Ambulatory Visit | Attending: Surgery | Admitting: Surgery

## 2013-06-27 DIAGNOSIS — R911 Solitary pulmonary nodule: Secondary | ICD-10-CM

## 2013-06-28 ENCOUNTER — Telehealth (INDEPENDENT_AMBULATORY_CARE_PROVIDER_SITE_OTHER): Payer: Self-pay

## 2013-06-28 ENCOUNTER — Other Ambulatory Visit (INDEPENDENT_AMBULATORY_CARE_PROVIDER_SITE_OTHER): Payer: Self-pay

## 2013-06-28 DIAGNOSIS — R911 Solitary pulmonary nodule: Secondary | ICD-10-CM

## 2013-06-28 NOTE — Telephone Encounter (Signed)
Called pt and let him know nodule is stable but we are referring him to thoracic surgery for consult.

## 2013-06-28 NOTE — Telephone Encounter (Signed)
Message copied by Brennan Bailey on Tue Jun 28, 2013 10:25 AM ------      Message from: Harriette Bouillon A      Created: Mon Jun 27, 2013  1:32 PM       No bigger.  Needs to see thoracic surgeon. ------

## 2013-07-01 ENCOUNTER — Telehealth (INDEPENDENT_AMBULATORY_CARE_PROVIDER_SITE_OTHER): Payer: Self-pay

## 2013-07-01 NOTE — Telephone Encounter (Signed)
Informed patient of appointment with Dr. Dorris Fetch at Cardio/Thoracic Surgery on Tuesday, Sept 16, 2014 at 2:00pm.  Instructed patient to arrive at 1:45pm.  Telephone number and address of Dr. Sunday Corn office given.  Pt verbalized understanding and states he will be there.

## 2013-07-05 ENCOUNTER — Institutional Professional Consult (permissible substitution) (INDEPENDENT_AMBULATORY_CARE_PROVIDER_SITE_OTHER): Payer: Medicare Other | Admitting: Thoracic Surgery (Cardiothoracic Vascular Surgery)

## 2013-07-05 ENCOUNTER — Encounter: Payer: Self-pay | Admitting: Thoracic Surgery (Cardiothoracic Vascular Surgery)

## 2013-07-05 VITALS — BP 90/50 | HR 60 | Resp 20 | Ht 71.0 in | Wt 135.0 lb

## 2013-07-05 DIAGNOSIS — R911 Solitary pulmonary nodule: Secondary | ICD-10-CM

## 2013-07-05 NOTE — Progress Notes (Signed)
PCP is Kaleen Mask, MD Referring Provider is Cornett, Clovis Pu., MD  Chief Complaint  Patient presents with  . Lung Lesion    Surgical eval on lung nodule, Chest CT 06/27/13    HPI: [name] is an 79 year old male with a past history of tobacco abuse (quit 25 years ago) and a history of throat cancer treated with radiation 10 years ago. He presents for evaluation of a right lung nodule.  [name] was found to have a right lung nodule in March of this year. He was being evaluated for hernia repair. A chest x-ray showed a new lung nodule. A CT of the chest was done which showed an 8 mm nodule in the right upper lobe anteriorly. There was no hilar or mediastinal adenopathy. He recently had a followup CT which showed no change in the lesion.  He has a history of throat cancer (squamous cell) 10 years ago. That was treated with radiation therapy and has had no evidence of recurrence. He says that he gets short of breath when he bends over to work, but if he sits on a bucket while working in the garden he does not have any problems. He does have her chronic dry cough which he was told he would have after his radiation therapy. He has lost 20-25 pounds over the last 2-3 years. This has been slow and progressive. He has not had any recent acceleration of his weight loss. He has not had any hemoptysis. He does bruise easily since his been on Coumadin for his atrial fibrillation. He has never been told he has emphysema. He denies any wheezing.   Past Medical History  Diagnosis Date  . Chronic atrial fibrillation   . Dilated cardiomyopathy   . PVC's (premature ventricular contractions)   . Hyperlipidemia   . COPD (chronic obstructive pulmonary disease)   . Hypothyroidism   . Renal calculi   . Chronic anticoagulation   . CHF (congestive heart failure)     DUE TO SYSTOLIC DYSFUNCTION  . Shortness of breath     exertional  . Cancer     vocal cord - radiation     Past Surgical History   Procedure Laterality Date  . Cardiac catheterization  02/26/2007    EF 35-40%  . Appendectomy    . Tonsillectomy and adenoidectomy    . US echocardiography  07/08/2010    EF 50-55%  . US echocardiography  02/16/2007    EF 25-35%  . Transthoracic echocardiogram  08/26/2001    EF 25-30%  . Cardiovascular stress test  02/18/2007    EF 40%  . Microlaryngoscopy with co2 laser and excision of vocal cord lesion      15 years ago  . Lithotripsy    . Inguinal hernia repair Right 12/28/2012    Procedure: HERNIA REPAIR INGUINAL ADULT;  Surgeon: Clovis Pu. Cornett, MD;  Location: [facility] OR;  Service: General;  Laterality: Right;    Family History  Problem Relation Age of Onset  . Stroke Mother   . Seizures Mother   . Heart attack Father   . Heart failure Father   . Heart attack Brother     Social History History  Substance Use Topics  . Smoking status: Former Smoker -- 1.00 packs/day for 35 years    Quit date: 07/12/1991  . Smokeless tobacco: Not on file  . Alcohol Use: No    Current Outpatient Prescriptions  Medication Sig Dispense Refill  . Calcium-Magnesium-Zinc (CAL-MAG-ZINC PO) Take 1  tablet by mouth daily.       . digoxin (LANOXIN) 0.25 MG tablet Take 0.25 mg by mouth daily.      . fish oil-omega-3 fatty acids 1000 MG capsule Take 1 g by mouth daily.       . Flaxseed, Linseed, (FLAX SEED OIL PO) Take 1 tablet by mouth daily.       . furosemide (LASIX) 20 MG tablet Take 1 tablet (20 mg total) by mouth daily.  30 tablet  11  . HYDROcodone-acetaminophen (NORCO) 5-325 MG per tablet Take 1 tablet by mouth every 6 (six) hours as needed for pain.  30 tablet  0  . levothyroxine (SYNTHROID, LEVOTHROID) 75 MCG tablet TAKE 1 TABLET (75 MCG TOTAL) BY MOUTH DAILY.  30 tablet  5  . metoprolol (LOPRESSOR) 50 MG tablet Take 1.5 tablets (75 mg total) by mouth 2 (two) times daily.  90 tablet  11  . Multiple Vitamin (MULTIVITAMIN) tablet Take 1 tablet by mouth daily.       . simvastatin (ZOCOR) 80 MG  tablet Take 1 tablet (80 mg total) by mouth at bedtime.  30 tablet  11  . valsartan (DIOVAN) 160 MG tablet Take 1 tablet (160 mg total) by mouth 2 (two) times daily.  60 tablet  11  . warfarin (COUMADIN) 3 MG tablet Take 1-1.5 tablets (3-4.5 mg total) by mouth daily. Takes one and half tablets 4.5 mg every day except Thursdays and take one tablet 3 mg  145 tablet  3   No current facility-administered medications for this visit.    No Known Allergies  Review of Systems  Respiratory: Positive for cough (Nonproductive, has had since radiation therapy for throat cancer.) and shortness of breath (wwith heavy exertion or working while bent over). Negative for wheezing.   Cardiovascular: Negative for chest pain and leg swelling.  Neurological: Negative.   Hematological: Bruises/bleeds easily (On Coumadin).  All other systems reviewed and are negative.    BP 90/50  Pulse 60  Resp 20  Ht 5\' 11"  (1.803 m)  Wt 135 lb (61.236 kg)  BMI 18.84 kg/m2  SpO2 91% Physical Exam  Vitals reviewed. Constitutional: He is oriented to person, place, and time.  Thin 79 yo male in NAD  HENT:  Head: Normocephalic and atraumatic.  Eyes: EOM are normal. Pupils are equal, round, and reactive to light.  Neck: Neck supple. No thyromegaly present.  Cardiovascular: Normal heart sounds.  Exam reveals no gallop.   No murmur heard. Irregular rhythm  Pulmonary/Chest: He has no wheezes. He has no rales.  Rhonchi bilaterally  Abdominal: Soft. There is no tenderness.  Musculoskeletal: He exhibits no edema.  Lymphadenopathy:    He has no cervical adenopathy.  Neurological: He is alert and oriented to person, place, and time. No cranial nerve deficit.  Skin: Skin is warm and dry.     Diagnostic Tests: CT of chest 06/27/2013 Clinical Data: Follow-up pulmonary nodule. History of throat  cancer with radiation therapy and surgery. Ex-smoker.  CT CHEST WITHOUT CONTRAST  Technique: Multidetector CT imaging of the  chest was performed  following the standard protocol without IV contrast.  Comparison: 12/23/2012 and 11/19/2005.  Findings: No pathologically enlarged mediastinal, hilar or axillary  lymph nodes. Heart size normal. Coronary artery calcification.  No pericardial effusion.  Trace right pleural fluid, stable. Extrapleural lymph nodes or  nodular pleural plaques in the posterior hemithoraces are unchanged  from 11/19/2005. Minimal biapical pleural parenchymal scarring.  Centrilobular emphysema is moderate  to severe. Spiculated right  upper lobe nodule measures 9 mm, stable from 12/23/2012 but new  from 11/19/2005. A linear nodule in the right middle lobe measures  approximately 6 mm (3 x 8 mm) and appears new from the 12/23/2012.  Tiny nodular density along the right hemidiaphragm (image 53) is  unchanged. Scattered pulmonary parenchymal scarring. Airway is  unremarkable.  Incidental imaging of the upper abdomen shows a sub centimeter low  attenuation lesion in the right hepatic lobe, too small to  characterize. Possible slight thickening of the right adrenal  gland. Nodularity the left adrenal gland measures up to 10 mm, as  before. Low attenuation lesions in the kidneys measure up to 3.6  cm. Kidneys are incompletely visualized. There may be tiny stone  debris in the right kidney. Tiny hiatal hernia. No worrisome  lytic or sclerotic lesions. Multiple thoracic compression  fractures, as before.  IMPRESSION:  1. Spiculated right upper lobe nodule, unchanged from 12/23/2012  but new from 11/19/2005. Finding remains worrisome for indolent  primary bronchogenic carcinoma.  2. Linear nodular density in the right middle lobe, new from prior  exam.  3. Given risk factors for bronchogenic carcinoma, follow-up chest  CT at 6 - 12 months is recommended. This recommendation follows  the consensus statement: Guidelines for Management of Small  Pulmonary Nodules Detected on CT Scans: A Statement  from the  Fleischner Society as published in Radiology 2005; 237:395-400.  Both the above findings will be reassessed at that time.  4. Trace right pleural fluid.  Original Report Authenticated By: Leanna Battles, M.D.  Impression: 79 year old former smoker with an 8 mm right upper lobe nodule. This was found incidentally on chest x-ray 6 months ago. It has not increased in size over the past 6 months.  This is concerning for a lung cancer given his history of tobacco abuse and COPD. It does not appear to be consistent with a metastasis. Given that this has not increased in size in the past 6 months, it would most likely be a low-grade tumor and can be safely followed until it shows signs of growth. Given his advanced age, COPD, and cardiac issues, I do not feel that surgical intervention is appropriate at this time. I do not think that this lesion could be easily biopsied.   My recommendation to him was that we repeat a CT in 6 months to followup the lesion. We did discuss the pros and cons of observation. He understands those issues.  Plan: Return in 6 months with CT of chest.

## 2013-07-08 ENCOUNTER — Other Ambulatory Visit: Payer: Self-pay

## 2013-07-08 MED ORDER — DIGOXIN 250 MCG PO TABS: 0.2500 mg | ORAL_TABLET | Freq: Every day | ORAL | Status: DC

## 2013-07-25 ENCOUNTER — Ambulatory Visit (INDEPENDENT_AMBULATORY_CARE_PROVIDER_SITE_OTHER): Payer: Medicare Other | Admitting: *Deleted

## 2013-07-25 DIAGNOSIS — I4891 Unspecified atrial fibrillation: Secondary | ICD-10-CM

## 2013-08-15 ENCOUNTER — Ambulatory Visit (INDEPENDENT_AMBULATORY_CARE_PROVIDER_SITE_OTHER): Payer: Medicare Other | Admitting: Cardiology

## 2013-08-15 ENCOUNTER — Encounter: Payer: Self-pay | Admitting: Cardiology

## 2013-08-15 VITALS — BP 132/78 | HR 62 | Ht 71.0 in | Wt 134.8 lb

## 2013-08-15 DIAGNOSIS — I509 Heart failure, unspecified: Secondary | ICD-10-CM

## 2013-08-15 DIAGNOSIS — I420 Dilated cardiomyopathy: Secondary | ICD-10-CM

## 2013-08-15 DIAGNOSIS — I5032 Chronic diastolic (congestive) heart failure: Secondary | ICD-10-CM

## 2013-08-15 DIAGNOSIS — I428 Other cardiomyopathies: Secondary | ICD-10-CM

## 2013-08-15 DIAGNOSIS — I4891 Unspecified atrial fibrillation: Secondary | ICD-10-CM

## 2013-08-15 MED ORDER — DIGOXIN 125 MCG PO TABS: 0.1250 mg | ORAL_TABLET | Freq: Every day | ORAL | Status: DC

## 2013-08-15 NOTE — Progress Notes (Signed)
Logan [name] Date of Birth: 07-05-1925   History of Present Illness: Mr. Hagedorn is seen for followup today. He has a history of permanent atrial fibrillation managed with rate control and anticoagulation. He also has chronic systolic heart failure with last ejection fraction of 55-60%. On followup today he reports he is doing very well. He has no symptoms of dyspnea or chest pain. He denies any dizziness. He is being followed by CT surgery with a right upper lobe lung nodule. Recent CT was unchanged from 6 months prior.  Current Outpatient Prescriptions on File Prior to Visit  Medication Sig Dispense Refill  . Calcium-Magnesium-Zinc (CAL-MAG-ZINC PO) Take 1 tablet by mouth daily.       . fish oil-omega-3 fatty acids 1000 MG capsule Take 1 g by mouth daily.       . Flaxseed, Linseed, (FLAX SEED OIL PO) Take 1 tablet by mouth daily.       . furosemide (LASIX) 20 MG tablet Take 1 tablet (20 mg total) by mouth daily.  30 tablet  11  . HYDROcodone-acetaminophen (NORCO) 5-325 MG per tablet Take 1 tablet by mouth every 6 (six) hours as needed for pain.  30 tablet  0  . levothyroxine (SYNTHROID, LEVOTHROID) 75 MCG tablet TAKE 1 TABLET (75 MCG TOTAL) BY MOUTH DAILY.  30 tablet  5  . metoprolol (LOPRESSOR) 50 MG tablet Take 1.5 tablets (75 mg total) by mouth 2 (two) times daily.  90 tablet  11  . Multiple Vitamin (MULTIVITAMIN) tablet Take 1 tablet by mouth daily.       . simvastatin (ZOCOR) 80 MG tablet Take 1 tablet (80 mg total) by mouth at bedtime.  30 tablet  11  . valsartan (DIOVAN) 160 MG tablet Take 1 tablet (160 mg total) by mouth 2 (two) times daily.  60 tablet  11  . warfarin (COUMADIN) 3 MG tablet Take 1-1.5 tablets (3-4.5 mg total) by mouth daily. Takes one and half tablets 4.5 mg every day except Thursdays and take one tablet 3 mg  145 tablet  3   No current facility-administered medications on file prior to visit.    No Known Allergies  Past Medical History  Diagnosis Date   . Chronic atrial fibrillation   . Dilated cardiomyopathy   . PVC's (premature ventricular contractions)   . Hyperlipidemia   . COPD (chronic obstructive pulmonary disease)   . Hypothyroidism   . Renal calculi   . Chronic anticoagulation   . CHF (congestive heart failure)     DUE TO SYSTOLIC DYSFUNCTION  . Shortness of breath     exertional  . Cancer     vocal cord - radiation     Past Surgical History  Procedure Laterality Date  . Cardiac catheterization  02/26/2007    EF 35-40%  . Appendectomy    . Tonsillectomy and adenoidectomy    . US echocardiography  07/08/2010    EF 50-55%  . US echocardiography  02/16/2007    EF 25-35%  . Transthoracic echocardiogram  08/26/2001    EF 25-30%  . Cardiovascular stress test  02/18/2007    EF 40%  . Microlaryngoscopy with co2 laser and excision of vocal cord lesion      15 years ago  . Lithotripsy    . Inguinal hernia repair Right 12/28/2012    Procedure: HERNIA REPAIR INGUINAL ADULT;  Surgeon: Clovis Pu. Cornett, MD;  Location: [facility] OR;  Service: General;  Laterality: Right;    History  Smoking status  . Former Smoker -- 1.00 packs/day for 35 years  . Quit date: 07/12/1991  Smokeless tobacco  . Not on file    History  Alcohol Use No    Family History  Problem Relation Age of Onset  . Stroke Mother   . Seizures Mother   . Heart attack Father   . Heart failure Father   . Heart attack Brother     Review of Systems: As per history of present illness.  All other systems were reviewed and are negative.  Physical Exam: BP 132/78  Pulse 62  Ht 5\' 11"  (1.803 m)  Wt 134 lb 12.8 oz (61.145 kg)  BMI 18.81 kg/m2 He is a pleasant white male in no acute distress. The HEENT exam is normal. The carotids are 2+ without bruits.  There is no thyromegaly.  There is no JVD.  The lungs are clear.  The heart exam reveals an irregular rate with a normal S1 and S2.  There are no murmurs, gallops, or rubs.  The PMI is not displaced.   Abdominal  exam reveals good bowel sounds.  There is no hepatosplenomegaly or tenderness.    Exam of the legs reveal no clubbing, cyanosis, or edema.  Pedal pulses are intact.  Cranial nerves II - XII are intact.  Motor and sensory functions are intact.  The gait is normal.  LABORATORY DATA: Lab Results  Component Value Date   WBC 8.6 12/22/2012   HGB 15.8 12/22/2012   HCT 45.9 12/22/2012   PLT 190 12/22/2012   GLUCOSE 96 12/22/2012   CHOL 160 07/26/2012   TRIG 63 07/26/2012   HDL 48 07/26/2012   LDLCALC 99 07/26/2012   ALT 19 07/25/2012   AST 31 07/25/2012   NA 142 12/22/2012   K 4.7 12/22/2012   CL 103 12/22/2012   CREATININE 1.01 12/22/2012   BUN 17 12/22/2012   CO2 34* 12/22/2012   TSH 0.440 07/25/2012   INR 3.3 07/25/2013   ECG demonstrates atrial fibrillation with a rate of 62 beats per minute. There is rightward axis. It is otherwise normal.  Assessment / Plan: 1. Atrial fibrillation, permanent. Rate is well controlled today. We will continue on his current dose of metoprolol and digoxin. Continue anticoagulation with Coumadin.  2. History of dilated cardiomyopathy. In 2008 ejection fraction was 25-35%. Repeat echocardiogram October 2013 showed an ejection fraction of 55-60%. He will continue with metoprolol and ARB.  3. Hyperlipidemia. Continue simvastatin and fish oil.

## 2013-08-15 NOTE — Patient Instructions (Signed)
Continue your current therapy  I will see you in 6 months.   

## 2013-08-29 ENCOUNTER — Ambulatory Visit (INDEPENDENT_AMBULATORY_CARE_PROVIDER_SITE_OTHER): Payer: Medicare Other | Admitting: General Practice

## 2013-08-29 DIAGNOSIS — I4891 Unspecified atrial fibrillation: Secondary | ICD-10-CM

## 2013-09-26 ENCOUNTER — Ambulatory Visit (INDEPENDENT_AMBULATORY_CARE_PROVIDER_SITE_OTHER): Payer: Medicare Other | Admitting: *Deleted

## 2013-09-26 DIAGNOSIS — I4891 Unspecified atrial fibrillation: Secondary | ICD-10-CM

## 2013-10-17 ENCOUNTER — Telehealth: Payer: Self-pay | Admitting: Cardiology

## 2013-10-17 ENCOUNTER — Ambulatory Visit (INDEPENDENT_AMBULATORY_CARE_PROVIDER_SITE_OTHER): Payer: Medicare Other | Admitting: Pharmacist

## 2013-10-17 DIAGNOSIS — I4891 Unspecified atrial fibrillation: Secondary | ICD-10-CM

## 2013-10-17 LAB — POCT INR: INR: 3.6

## 2013-10-17 NOTE — Telephone Encounter (Signed)
New problem    Wife calling. Would like to be seen today since patient has a coumadin appt at 10:30    C/O not feeling well.  Sob .  No chest pain .

## 2013-10-17 NOTE — Telephone Encounter (Signed)
Returned call to patient he stated he has been sob for the past 2 days.Stated he also has a cough,coughing clear to yellow phlegm.Stated he has appointment for INR at 10:30 and wants to be seen.Spoke to DOD Dr.Nishan and he advised patient needs to see PCP.

## 2013-10-18 ENCOUNTER — Other Ambulatory Visit: Payer: Self-pay | Admitting: Cardiology

## 2013-10-18 ENCOUNTER — Telehealth: Payer: Self-pay | Admitting: Cardiology

## 2013-10-18 NOTE — Telephone Encounter (Signed)
New Message  Pt called states that he has seen PCP he wasn't feeling well.. They gave him the medication listed below and he is requesting a call back to determine if they are safe to take with his coumadin please advise.  1. prednisone  2. amox tr-k clv 875-125 mg tab

## 2013-10-18 NOTE — Telephone Encounter (Signed)
Patient notified to reduce warfarin to 3 mg qd except 4.5 mg Tuesday and Sunday, and recheck INR here in 6 days.  He will be on prednisone for 10 days and augmentin for 7-10 days.  Patient was told to bring these bottles with him to his Monday appointment.  Patient shows verbal understanding of plan.  Appointment made for 10/24/13.

## 2013-10-24 ENCOUNTER — Ambulatory Visit (INDEPENDENT_AMBULATORY_CARE_PROVIDER_SITE_OTHER): Payer: Medicare Other | Admitting: *Deleted

## 2013-10-24 DIAGNOSIS — I4891 Unspecified atrial fibrillation: Secondary | ICD-10-CM

## 2013-10-24 LAB — POCT INR: INR: 2.7

## 2013-10-30 ENCOUNTER — Inpatient Hospital Stay (HOSPITAL_COMMUNITY)
Admission: EM | Admit: 2013-10-30 | Discharge: 2013-11-04 | DRG: 871 | Disposition: A | Discharge status: Home / Self Care | Payer: Medicare Other | Attending: Family Medicine | Admitting: Family Medicine

## 2013-10-30 ENCOUNTER — Emergency Department (HOSPITAL_COMMUNITY): Payer: Medicare Other

## 2013-10-30 ENCOUNTER — Encounter (HOSPITAL_COMMUNITY): Payer: Self-pay | Admitting: Emergency Medicine

## 2013-10-30 DIAGNOSIS — J449 Chronic obstructive pulmonary disease, unspecified: Secondary | ICD-10-CM | POA: Diagnosis present

## 2013-10-30 DIAGNOSIS — E785 Hyperlipidemia, unspecified: Secondary | ICD-10-CM | POA: Diagnosis present

## 2013-10-30 DIAGNOSIS — R64 Cachexia: Secondary | ICD-10-CM | POA: Diagnosis present

## 2013-10-30 DIAGNOSIS — J189 Pneumonia, unspecified organism: Secondary | ICD-10-CM | POA: Diagnosis present

## 2013-10-30 DIAGNOSIS — I509 Heart failure, unspecified: Secondary | ICD-10-CM | POA: Diagnosis present

## 2013-10-30 DIAGNOSIS — E872 Acidosis, unspecified: Secondary | ICD-10-CM | POA: Diagnosis present

## 2013-10-30 DIAGNOSIS — I10 Essential (primary) hypertension: Secondary | ICD-10-CM | POA: Diagnosis present

## 2013-10-30 DIAGNOSIS — I5033 Acute on chronic diastolic (congestive) heart failure: Secondary | ICD-10-CM | POA: Diagnosis present

## 2013-10-30 DIAGNOSIS — Z681 Body mass index (BMI) 19 or less, adult: Secondary | ICD-10-CM

## 2013-10-30 DIAGNOSIS — I5032 Chronic diastolic (congestive) heart failure: Secondary | ICD-10-CM

## 2013-10-30 DIAGNOSIS — J9600 Acute respiratory failure, unspecified whether with hypoxia or hypercapnia: Secondary | ICD-10-CM | POA: Diagnosis present

## 2013-10-30 DIAGNOSIS — R0603 Acute respiratory distress: Secondary | ICD-10-CM

## 2013-10-30 DIAGNOSIS — I959 Hypotension, unspecified: Secondary | ICD-10-CM

## 2013-10-30 DIAGNOSIS — Z923 Personal history of irradiation: Secondary | ICD-10-CM

## 2013-10-30 DIAGNOSIS — J4489 Other specified chronic obstructive pulmonary disease: Secondary | ICD-10-CM | POA: Diagnosis present

## 2013-10-30 DIAGNOSIS — R0902 Hypoxemia: Secondary | ICD-10-CM

## 2013-10-30 DIAGNOSIS — R911 Solitary pulmonary nodule: Secondary | ICD-10-CM | POA: Diagnosis present

## 2013-10-30 DIAGNOSIS — Z87891 Personal history of nicotine dependence: Secondary | ICD-10-CM

## 2013-10-30 DIAGNOSIS — E039 Hypothyroidism, unspecified: Secondary | ICD-10-CM | POA: Diagnosis present

## 2013-10-30 DIAGNOSIS — I428 Other cardiomyopathies: Secondary | ICD-10-CM | POA: Diagnosis present

## 2013-10-30 DIAGNOSIS — A419 Sepsis, unspecified organism: Principal | ICD-10-CM | POA: Diagnosis present

## 2013-10-30 DIAGNOSIS — Z66 Do not resuscitate: Secondary | ICD-10-CM | POA: Diagnosis present

## 2013-10-30 DIAGNOSIS — I4891 Unspecified atrial fibrillation: Secondary | ICD-10-CM | POA: Diagnosis present

## 2013-10-30 DIAGNOSIS — R0600 Dyspnea, unspecified: Secondary | ICD-10-CM

## 2013-10-30 DIAGNOSIS — Z8249 Family history of ischemic heart disease and other diseases of the circulatory system: Secondary | ICD-10-CM

## 2013-10-30 DIAGNOSIS — Z823 Family history of stroke: Secondary | ICD-10-CM

## 2013-10-30 DIAGNOSIS — Z7901 Long term (current) use of anticoagulants: Secondary | ICD-10-CM

## 2013-10-30 DIAGNOSIS — I420 Dilated cardiomyopathy: Secondary | ICD-10-CM | POA: Diagnosis present

## 2013-10-30 DIAGNOSIS — I482 Chronic atrial fibrillation, unspecified: Secondary | ICD-10-CM

## 2013-10-30 LAB — CBC WITH DIFFERENTIAL/PLATELET
Basophils Absolute: 0 10*3/uL (ref 0.0–0.1)
Basophils Relative: 0 % (ref 0–1)
Eosinophils Absolute: 0.4 10*3/uL (ref 0.0–0.7)
Eosinophils Relative: 2 % (ref 0–5)
HEMATOCRIT: 48.1 % (ref 39.0–52.0)
HEMOGLOBIN: 16.2 g/dL (ref 13.0–17.0)
LYMPHS PCT: 13 % (ref 12–46)
Lymphs Abs: 2.5 10*3/uL (ref 0.7–4.0)
MCH: 34.9 pg — ABNORMAL HIGH (ref 26.0–34.0)
MCHC: 33.7 g/dL (ref 30.0–36.0)
MCV: 103.7 fL — ABNORMAL HIGH (ref 78.0–100.0)
MONO ABS: 1.8 10*3/uL — AB (ref 0.1–1.0)
MONOS PCT: 9 % (ref 3–12)
NEUTROS ABS: 14.8 10*3/uL — AB (ref 1.7–7.7)
Neutrophils Relative %: 76 % (ref 43–77)
Platelets: 285 10*3/uL (ref 150–400)
RBC: 4.64 MIL/uL (ref 4.22–5.81)
RDW: 14.2 % (ref 11.5–15.5)
WBC: 19.5 10*3/uL — AB (ref 4.0–10.5)

## 2013-10-30 LAB — COMPREHENSIVE METABOLIC PANEL
ALT: 29 U/L (ref 0–53)
AST: 33 U/L (ref 0–37)
Albumin: 3.1 g/dL — ABNORMAL LOW (ref 3.5–5.2)
Alkaline Phosphatase: 86 U/L (ref 39–117)
BILIRUBIN TOTAL: 0.5 mg/dL (ref 0.3–1.2)
BUN: 33 mg/dL — AB (ref 6–23)
CHLORIDE: 102 meq/L (ref 96–112)
CO2: 32 meq/L (ref 19–32)
CREATININE: 1.17 mg/dL (ref 0.50–1.35)
Calcium: 9.1 mg/dL (ref 8.4–10.5)
GFR calc Af Amer: 63 mL/min — ABNORMAL LOW (ref >90)
GFR calc non Af Amer: 55 mL/min — ABNORMAL LOW (ref >90)
Glucose, Bld: 194 mg/dL — ABNORMAL HIGH (ref 70–99)
Potassium: 5.1 mEq/L (ref 3.7–5.3)
Sodium: 142 mEq/L (ref 137–147)
Total Protein: 7.4 g/dL (ref 6.0–8.3)

## 2013-10-30 LAB — TROPONIN I
Troponin I: <0.3 ng/mL (ref <0.30)
Troponin I: <0.3 ng/mL (ref <0.30)

## 2013-10-30 LAB — PRO B NATRIURETIC PEPTIDE: PRO B NATRI PEPTIDE: 982.1 pg/mL — AB (ref 0–450)

## 2013-10-30 LAB — DIGOXIN LEVEL
DIGOXIN LVL: 0.9 ng/mL (ref 0.8–2.0)
Digoxin Level: 0.8 ng/mL (ref 0.8–2.0)

## 2013-10-30 LAB — POCT I-STAT TROPONIN I: Troponin i, poc: 0.01 ng/mL (ref 0.00–0.08)

## 2013-10-30 LAB — PROTIME-INR
INR: 3.23 — AB (ref 0.00–1.49)
PROTHROMBIN TIME: 31.8 s — AB (ref 11.6–15.2)

## 2013-10-30 MED ORDER — CEFTRIAXONE SODIUM 1 G IJ SOLR: 1.0000 g | INTRAMUSCULAR | Status: DC

## 2013-10-30 MED ORDER — ALBUTEROL SULFATE (2.5 MG/3ML) 0.083% IN NEBU
5.0000 mg | INHALATION_SOLUTION | Freq: Once | RESPIRATORY_TRACT | Status: AC
  Administered 2013-10-30: 5 mg via RESPIRATORY_TRACT
  Filled 2013-10-30: qty 6

## 2013-10-30 MED ORDER — DIGOXIN 125 MCG PO TABS
0.1250 mg | ORAL_TABLET | Freq: Every day | ORAL | Status: DC
  Administered 2013-10-31 – 2013-11-04 (×5): 0.125 mg via ORAL
  Filled 2013-10-30 (×5): qty 1

## 2013-10-30 MED ORDER — LEVOTHYROXINE SODIUM 75 MCG PO TABS
75.0000 ug | ORAL_TABLET | Freq: Every day | ORAL | Status: DC
Start: 2013-10-31 — End: 2013-11-04
  Administered 2013-10-31 – 2013-11-04 (×5): 75 ug via ORAL
  Filled 2013-10-30 (×7): qty 1

## 2013-10-30 MED ORDER — DEXTROSE 5 % IV SOLN
500.0000 mg | INTRAVENOUS | Status: DC
  Administered 2013-10-30: 500 mg via INTRAVENOUS

## 2013-10-30 MED ORDER — OSELTAMIVIR PHOSPHATE 75 MG PO CAPS
75.0000 mg | ORAL_CAPSULE | Freq: Two times a day (BID) | ORAL | Status: DC
  Administered 2013-10-31: 75 mg via ORAL
  Filled 2013-10-30 (×4): qty 1

## 2013-10-30 MED ORDER — ONDANSETRON HCL 4 MG/2ML IJ SOLN: 4.0000 mg | Freq: Four times a day (QID) | INTRAMUSCULAR | Status: DC | PRN

## 2013-10-30 MED ORDER — ATORVASTATIN CALCIUM 40 MG PO TABS
40.0000 mg | ORAL_TABLET | Freq: Every day | ORAL | Status: DC
  Administered 2013-10-31 – 2013-11-03 (×4): 40 mg via ORAL
  Filled 2013-10-30 (×5): qty 1

## 2013-10-30 MED ORDER — ACETAMINOPHEN 325 MG PO TABS: 650.0000 mg | ORAL_TABLET | ORAL | Status: DC | PRN

## 2013-10-30 MED ORDER — HYDROCODONE-ACETAMINOPHEN 5-325 MG PO TABS: 2.0000 | ORAL_TABLET | Freq: Four times a day (QID) | ORAL | Status: DC | PRN

## 2013-10-30 MED ORDER — DEXTROSE 5 % IV SOLN
5.0000 mg/h | INTRAVENOUS | Status: DC
  Administered 2013-10-30: 5 mg/h via INTRAVENOUS

## 2013-10-30 MED ORDER — DEXTROSE 5 % IV SOLN: 500.0000 mg | INTRAVENOUS | Status: DC

## 2013-10-30 MED ORDER — DEXTROSE 5 % IV SOLN
1.0000 g | INTRAVENOUS | Status: DC
  Administered 2013-10-30: 1 g via INTRAVENOUS
  Filled 2013-10-30: qty 10

## 2013-10-30 MED ORDER — METOPROLOL TARTRATE 50 MG PO TABS
75.0000 mg | ORAL_TABLET | Freq: Two times a day (BID) | ORAL | Status: DC
  Filled 2013-10-30 (×3): qty 1

## 2013-10-30 MED ORDER — IPRATROPIUM BROMIDE 0.02 % IN SOLN
0.5000 mg | Freq: Once | RESPIRATORY_TRACT | Status: AC
  Administered 2013-10-30: 0.5 mg via RESPIRATORY_TRACT
  Filled 2013-10-30: qty 2.5

## 2013-10-30 MED ORDER — ASPIRIN EC 81 MG PO TBEC
81.0000 mg | DELAYED_RELEASE_TABLET | Freq: Every day | ORAL | Status: DC
  Administered 2013-10-31 – 2013-11-04 (×5): 81 mg via ORAL
  Filled 2013-10-30 (×5): qty 1

## 2013-10-30 MED ORDER — DILTIAZEM LOAD VIA INFUSION
20.0000 mg | Freq: Once | INTRAVENOUS | Status: DC
  Filled 2013-10-30: qty 20

## 2013-10-30 NOTE — ED Notes (Signed)
Admitting MD at bedside.

## 2013-10-30 NOTE — Plan of Care (Signed)
Problem: Phase I Progression Outcomes Goal: Pain controlled with appropriate interventions Outcome: Progressing Denies pain at this time Goal: Voiding-avoid urinary catheter unless indicated Outcome: Progressing voiding Goal: Hemodynamically stable Outcome: Progressing Vss@this  time

## 2013-10-30 NOTE — Progress Notes (Signed)
ANTICOAGULATION CONSULT NOTE - Initial Consult  Pharmacy Consult for Warfarin  Indication: atrial fibrillation  No Known Allergies  Patient Measurements: Height: 5\' 7"  (170.2 cm) Weight: 122 lb 5.7 oz (55.5 kg) IBW/kg (Calculated) : 66.1  Labs:  Recent Labs  10/30/13 1739 10/30/13 2025  HGB 16.2  --   HCT 48.1  --   PLT 285  --   LABPROT 31.8*  --   INR 3.23*  --   CREATININE 1.17  --   TROPONINI <0.30 <0.30   Medical History: Past Medical History  Diagnosis Date  . Chronic atrial fibrillation   . Dilated cardiomyopathy   . PVC's (premature ventricular contractions)   . Hyperlipidemia   . COPD (chronic obstructive pulmonary disease)   . Hypothyroidism   . Renal calculi   . Chronic anticoagulation   . CHF (congestive heart failure)     DUE TO SYSTOLIC DYSFUNCTION  . Shortness of breath     exertional  . Cancer     vocal cord - radiation    Medications:  Warfarin PTA: 4.5 mg on Sun/Tues, 3 mg all other days  Assessment: 80 y/o M here with progressive dyspnea. On chronic warfarin for afib. INR on admit this evening is 3.23.   Goal of Therapy:  INR 2-3 Monitor platelets by anticoagulation protocol: Yes   Plan:  -INR in the AM to assess for dosing on 1/12  Narda Bonds 10/30/2013,11:53 PM

## 2013-10-30 NOTE — ED Notes (Signed)
Attempted to call report. Floor RN unable to accept report.  

## 2013-10-30 NOTE — H&P (Signed)
Family Medicine Teaching Service Service Pager: [phone]  Logan [name] 80 y.o. male  MRN: [id number]  DOB: [birth year]    LOS: 0 days  DNR PCP: Leonard Downing, MD  CONSULTANTS: Cardiology ([facility] [place])   PATIENT OVERVIEW  He was admitted on 10/30/2013 for acute respiratory distress. Being treated for CAP & Vanc for ?post flu Influenza & Flu in the setting of .  His PMHx is significant for CHF with dialated cardiomyopathy, AFib HTN, HLD, Hypothyroidism, solitary pulmonary nodule and likely COPD by CXR.  1/11 - Hypoxic in ED to 70s on RA 1/12 - Profoundly acidotic ABG with increased work of breathing. Intolerant to BiPAP  HPI & ROS  Logan [name] is a 80 y.o. year old male 3 week progressive dyspnea and noted to be hypoxic using a home pulse oximeter. PMH is significant for diastolic CHF with dilated cardiomyopathy, A. Fib, hypertension, hyperlipidemia, hypothyroidism, solitary pulmonary nodule.  He reports having a 3 week illness that has progressively worsened during that time.  He was evaluated initially by his primary care physician and started on Augmentin and steroids.  He finished this approximately 5 days prior to presentation.  He had been checking his home pulse oximeter (had from a prior episode requiring home O2 but no longer on home O2) and found himself to be in the 80s.  Presented to the emergency department due to his increased work of breathing and found to be hypoxic to 70s.  Denies fevers, chills, rigors at home. No diarrhea, melana, hematochezia, nausea, vomiting, over the past 3 weeks. Denies tachypalpitations, orthopnea, PND or LE edema.    HISTORY    Past Medical History  Diagnosis Date  . Chronic atrial fibrillation   . Dilated cardiomyopathy   . PVC's (premature ventricular contractions)   . Hyperlipidemia   . COPD (chronic obstructive pulmonary disease)   . Hypothyroidism   . Renal calculi   . Chronic anticoagulation   . CHF (congestive heart  failure)     DUE TO SYSTOLIC DYSFUNCTION  . Shortness of breath     exertional  . Cancer     vocal cord - radiation    Past Surgical History  Procedure Laterality Date  . Cardiac catheterization  02/26/2007    EF 35-40%  . Appendectomy    . Tonsillectomy and adenoidectomy    . US echocardiography  07/08/2010    EF 50-55%  . US echocardiography  02/16/2007    EF 25-35%  . Transthoracic echocardiogram  08/26/2001    EF 25-30%  . Cardiovascular stress test  02/18/2007    EF 40%  . Microlaryngoscopy with co2 laser and excision of vocal cord lesion      15 years ago  . Lithotripsy    . Inguinal hernia repair Right 12/28/2012    Procedure: HERNIA REPAIR INGUINAL ADULT;  Surgeon: Joyice Faster. Cornett, MD;  Location: [facility];  Service: General;  Laterality: Right;   Social Hx: History   Social History  . Marital Status: Married    Spouse Name: N/A    Number of Children: 0  . Years of Education: N/A   Occupational History  . electrician    Social History Main Topics  . Smoking status: Former Smoker -- 1.00 packs/day for 35 years    Quit date: 07/12/1991  . Smokeless tobacco: None  . Alcohol Use: No  . Drug Use: No  . Sexual Activity: None   Other Topics Concern  . None   Social  History Narrative  . None   Family History  Problem Relation Age of Onset  . Stroke Mother   . Seizures Mother   . Heart attack Father   . Heart failure Father   . Heart attack Brother    No Known Allergies  Home Medications: Medication Sig  Calcium-Magnesium-Zinc (CAL-MAG-ZINC PO) Take 1 tablet by mouth daily.   digoxin (LANOXIN) 0.125 MG tablet Take 1 tablet (0.125 mg total) by mouth daily.  fish oil-omega-3 fatty acids 1000 MG capsule Take 1 g by mouth daily.   Flaxseed, Linseed, (FLAX SEED OIL PO) Take 1 tablet by mouth daily.   furosemide (LASIX) 20 MG tablet Take 1 tablet (20 mg total) by mouth daily.  HYDROcodone-acetaminophen (NORCO) 5-325 MG per tablet Take 1 tablet by mouth every 6  (six) hours as needed for pain.  levothyroxine (SYNTHROID, LEVOTHROID) 75 MCG tablet Take 75 mcg by mouth daily before breakfast.  metoprolol (LOPRESSOR) 50 MG tablet Take 1.5 tablets (75 mg total) by mouth 2 (two) times daily.  Multiple Vitamin (MULTIVITAMIN) tablet Take 1 tablet by mouth daily.   simvastatin (ZOCOR) 80 MG tablet Take 1 tablet (80 mg total) by mouth at bedtime.  valsartan (DIOVAN) 160 MG tablet Take 1 tablet (160 mg total) by mouth 2 (two) times daily.  warfarin (COUMADIN) 3 MG tablet Take 3-4.5 mg by mouth daily. Sundays and Tuesday takes 4.5mg All other days 3mg  amoxicillin-clavulanate (AUGMENTIN) 875-125 MG per tablet Take 1 tablet by mouth 2 (two) times daily.  predniSONE (STERAPRED UNI-PAK) 10 MG tablet Take 1-6 tablets by mouth daily.   OBJECTIVE  Vitals: Temp:  [98.1 F (36.7 C)-99.4 F (37.4 C)] 99.4 F (37.4 C) (01/11 2300) Pulse Rate:  [61-130] 95 (01/12 0100) Resp:  [23-45] 24 (01/12 0100) BP: (94-142)/(53-81) 98/56 mmHg (01/12 0100) SpO2:  [65 %-100 %] 96 % (01/12 0100) Weight:  [122 lb 5.7 oz (55.5 kg)] 122 lb 5.7 oz (55.5 kg) (01/11 2300)  Weight: Baseline Weight: 134(10/27) Filed Weights   10/30/13 2300  Weight: 122 lb 5.7 oz (55.5 kg)   I&Os:  Intake/Output Summary (Last 24 hours) at 10/31/13 0245 Last data filed at 10/31/13 0100  Gross per 24 hour  Intake     20  ml  Output    150 ml  Net   -130 ml   PHYSICAL EXAM: GENERAL: Elderly caucasian male. In no discomfort; no respiratory distress  PSYCH: alert and appropriate, good insight   HNEENT: H&N: AT/[place], trachea midline  Eyes: no scleral icterus, no conjunctival exudate  Ears:   Nose:   Oropharynx: Mildly dry Mucous membranes. No exudate  Dentention:     CARDIO:  irregularly irregular and tachycardic   LUNGS:  overall poor air movement.  Expiratory and expiratory wheezing.  Use of accessory muscles with mild belly breathing   ABDOMEN: +BS, soft, non-tender, no rigidity, no guarding,  no masses/hepatosplenomegaly  EXTREM:  Warm, well perfused.  Moves all 4 extremities spontaneously; no lateralization. Distal pulses trace.  Trace pretibial edema.  GU:   SKIN:    IMAGING: 1/11 - 1V CX -  Hyper expanded, left mid lung streaky airspace disease highly suspicious for pneumonia.  LABS: Daily Others:   Recent Labs Lab 10/30/13 1739  WBC 19.5*  HGB 16.2  HCT 48.1  PLT 285    Recent Labs Lab 10/30/13 1739  NA 142  K 5.1  CL 102  CO2 32  BUN 33*  CREATININE 1.17  GLUCOSE 194*  CALCIUM 9.1  Recent Labs Lab 10/30/13 1739 10/30/13 1754 10/30/13 2025  TROPIPOC  --  0.01  --   TROPONINI <0.30  --  <0.30  PROBNP 982.1*  --   --       Recent Labs Lab 10/30/13 1739 10/30/13 1753  DIGOXIN 0.9 0.8       Recent Labs Lab 10/30/13  MG 2.4    Recent Labs Lab 10/30/13 1739  ALT 29  AST 33  ALKPHOS 86  BILITOT 0.5    Recent Labs Lab 10/30/13 1739  PROT 7.4  ALBUMIN 3.1*    Recent Labs Lab 10/24/13 1113 10/30/13 1739 10/31/13 0355  INR 2.7 3.23* 2.85*    Recent Labs Lab 10/31/13 0255  PHART 7.262*  PCO2ART 64.7*  PO2ART 103.0*  HCO3 28.0*  TCO2 30.0  O2SAT 97.0   URINE STUDIES: none  MICRO: ABX: MICRO:  1/11 - Rocephin >> 1/11 - Azithro >> 1/11 - Tamiflu >> 1/12 - Vanco >> 1/11 - Urine Legonella/Strep Pneumonia 1/11 - Viral Panel   Medications:  Continuous meds: . diltiazem (CARDIZEM) infusion 10 mg/hr (10/30/13 2104)   Scheduled meds: . aspirin EC  81 mg Oral Daily  . atorvastatin  40 mg Oral q1800  . azithromycin  500 mg Intravenous Q24H  . azithromycin  500 mg Intravenous Q24H  . cefTRIAXone (ROCEPHIN)  IV  1 g Intravenous Q24H  . digoxin  0.125 mg Oral Daily  . diltiazem  20 mg Intravenous Once  . levothyroxine  75 mcg Oral QAC breakfast  . oseltamivir  75 mg Oral BID   PRN meds: acetaminophen, HYDROcodone-acetaminophen, ondansetron (ZOFRAN) IV Assessment & Plan  This is an updated plan including  changes to the plan after his episodes of worsening distress.    PULM: Acute respiratory failure, CAP, Hypoxia, leukocytosis, Hx of Pulmonary nodule - Supplemental O2 for O2 Sat > 89% - Pulmonary toilet - Rocephin and azithromycin - Add Vancomycin to cover for potential post influenza MRSA pneumonia - Failed BiPAP with worsening O2 saturations - hold pain medications - encourage cough  Cardiac: A Fib with RVR, CHF, Hypotension, PVCs - cycle troponins - ECHO in AM - start dilt drip for RVR that began in the ED - Discussed with Dr. Wynonia Lawman who agrees this is likely primary pulmonary.  [facility] aware - coumadin per pharmacy - continue ASA, Lipitor, Digoxin (therapeutic) - hold metoprolol given hypotension - PVCs chronic with doublets evident on telemetry; monitor  Sepsis with hypotension and severe respiratory acidosis: - Fluid resuscitate gently given significant CHF hx but down 12# from baseline. - Obtain Sepsis Markers (Lactate and PCT)  Hypothyroidism: - continue home synthroid  Hx of Vocal Cord CA s/p radiation  FEN/GI: Give 1L NS bolus PROPHYLAXIS: On Heparin DIET: Cardiac  Disposition  To SDU in guarded condition.  Updated wife at 0400 given acute worsening.  Confirmed DNR/DNI status with pt at admission.  Follow-up issues:    [ ]  f/u CEs [ ]  f/u resp & HR [ ]  f/u Sepsis markers [ ]  f/u flu/pneumonia/legionella [ ]  f/u fluid status - likely needs more fluid but tenuous given hx of mixed CHF    Gerda Diss, DO [facility] Resident - PGY-3 10/31/2013 2:45 AM   Addendum: Once patient admitted to the floor she continued to have increased work of breathing.  On my reevaluation he was having significantly diminished breath sounds bilaterally, left greater than right.  ABG was obtained showing significant hypoxia and hypercarbia with a  respiratory acidosis.  BiPAP was initiated however the patient did not tolerate this and started complaining  of significant chest pain.  Stat EKG and chest x-ray were ordered.  No evidence of STEMI.  Sepsis labs obtained vancomycin and 1 L normal saline bolus administered.  Chest x-ray shows worsening left infiltrate; no pneumothorax Patient's breathing improved after removing BiPAP with saturations returning to the 90s with 100% nonrebreather.  I have updated his wife and discussed with the patient and will continue with antibiotics and currently fluid resuscitation.

## 2013-10-30 NOTE — ED Provider Notes (Signed)
CSN: [id number]     Arrival date & time 10/30/13  1731 History   First MD Initiated Contact with Patient 10/30/13 1744     Chief Complaint  Patient presents with  . Shortness of Breath  . Abdominal Pain   (Consider location/radiation/quality/duration/timing/severity/associated sxs/prior Treatment) Patient is a 80 y.o. male presenting with shortness of breath. The history is provided by the patient.  Shortness of Breath Associated symptoms: cough   Associated symptoms: no abdominal pain, no fever, no headaches, no neck pain, no rash, no sore throat and no vomiting   pt with progressive sob in the past 2 weeks. Now severe, even at rest. States prod cough, yellowish phlegm, initial onset 2 weeks ago. Completed abx w mild relief. Chest tight, but no episodic cp.  ?orthopnea. No pnd. No leg swelling or wt gain. States compliant w normal meds. Denies fever or chills. No pleuritic pain. No hx dvt or pe. Hx afib. Denies palpitations or rapid heartbeat.      Past Medical History  Diagnosis Date  . Chronic atrial fibrillation   . Dilated cardiomyopathy   . PVC's (premature ventricular contractions)   . Hyperlipidemia   . COPD (chronic obstructive pulmonary disease)   . Hypothyroidism   . Renal calculi   . Chronic anticoagulation   . CHF (congestive heart failure)     DUE TO SYSTOLIC DYSFUNCTION  . Shortness of breath     exertional  . Cancer     vocal cord - radiation    Past Surgical History  Procedure Laterality Date  . Cardiac catheterization  02/26/2007    EF 35-40%  . Appendectomy    . Tonsillectomy and adenoidectomy    . US echocardiography  07/08/2010    EF 50-55%  . US echocardiography  02/16/2007    EF 25-35%  . Transthoracic echocardiogram  08/26/2001    EF 25-30%  . Cardiovascular stress test  02/18/2007    EF 40%  . Microlaryngoscopy with co2 laser and excision of vocal cord lesion      15 years ago  . Lithotripsy    . Inguinal hernia repair Right 12/28/2012     Procedure: HERNIA REPAIR INGUINAL ADULT;  Surgeon: Joyice Faster. Cornett, MD;  Location: [facility];  Service: General;  Laterality: Right;   Family History  Problem Relation Age of Onset  . Stroke Mother   . Seizures Mother   . Heart attack Father   . Heart failure Father   . Heart attack Brother    History  Substance Use Topics  . Smoking status: Former Smoker -- 1.00 packs/day for 35 years    Quit date: 07/12/1991  . Smokeless tobacco: Not on file  . Alcohol Use: No    Review of Systems  Constitutional: Negative for fever and chills.  HENT: Negative for sore throat.   Eyes: Negative for redness.  Respiratory: Positive for cough and shortness of breath.   Cardiovascular: Negative for palpitations and leg swelling.  Gastrointestinal: Negative for vomiting and abdominal pain.  Genitourinary: Negative for flank pain.  Musculoskeletal: Negative for back pain and neck pain.  Skin: Negative for rash.  Neurological: Negative for headaches.  Hematological: Does not bruise/bleed easily.  Psychiatric/Behavioral: Negative for confusion.    Allergies  Review of patient's allergies indicates no known allergies.  Home Medications   Current Outpatient Rx  Name  Route  Sig  Dispense  Refill  . Calcium-Magnesium-Zinc (CAL-MAG-ZINC PO)   Oral   Take 1 tablet by mouth  daily.          . digoxin (LANOXIN) 0.125 MG tablet   Oral   Take 1 tablet (0.125 mg total) by mouth daily.   30 tablet   11   . fish oil-omega-3 fatty acids 1000 MG capsule   Oral   Take 1 g by mouth daily.          . Flaxseed, Linseed, (FLAX SEED OIL PO)   Oral   Take 1 tablet by mouth daily.          . furosemide (LASIX) 20 MG tablet   Oral   Take 1 tablet (20 mg total) by mouth daily.   30 tablet   11   . HYDROcodone-acetaminophen (NORCO) 5-325 MG per tablet   Oral   Take 1 tablet by mouth every 6 (six) hours as needed for pain.   30 tablet   0   . levothyroxine (SYNTHROID, LEVOTHROID) 75 MCG  tablet      TAKE 1 TABLET (75 MCG TOTAL) BY MOUTH DAILY.   30 tablet   5   . metoprolol (LOPRESSOR) 50 MG tablet   Oral   Take 1.5 tablets (75 mg total) by mouth 2 (two) times daily.   90 tablet   11     Previous rx dispense is incorrect.   . Multiple Vitamin (MULTIVITAMIN) tablet   Oral   Take 1 tablet by mouth daily.          . simvastatin (ZOCOR) 80 MG tablet   Oral   Take 1 tablet (80 mg total) by mouth at bedtime.   30 tablet   11   . valsartan (DIOVAN) 160 MG tablet   Oral   Take 1 tablet (160 mg total) by mouth 2 (two) times daily.   60 tablet   11   . warfarin (COUMADIN) 3 MG tablet   Oral   Take 1-1.5 tablets (3-4.5 mg total) by mouth daily. Takes one and half tablets 4.5 mg every day except Thursdays and take one tablet 3 mg   145 tablet   3    BP 142/66  Pulse 121  Resp 36  SpO2 65% Physical Exam  Nursing note and vitals reviewed. Constitutional: He is oriented to person, place, and time. He appears well-developed and well-nourished. He appears distressed.  Pt in resp distress.  HENT:  Mouth/Throat: Oropharynx is clear and moist.  Eyes: Conjunctivae are normal.  Neck: Neck supple. No tracheal deviation present. No thyromegaly present.  Cardiovascular: Normal heart sounds and intact distal pulses.  Exam reveals no gallop and no friction rub.   No murmur heard. tachycardic  Pulmonary/Chest: Effort normal. No accessory muscle usage. No respiratory distress. He has wheezes. He exhibits no tenderness.  Tachypnea. Rhonchi.   Abdominal: Soft. Bowel sounds are normal. He exhibits no distension and no mass. There is no tenderness. There is no rebound and no guarding.  Genitourinary:  No cva tenderness  Musculoskeletal: Normal range of motion. He exhibits no edema and no tenderness.  Neurological: He is alert and oriented to person, place, and time.  Skin: Skin is warm and dry.  Psychiatric: He has a normal mood and affect.    ED Course  Procedures  (including critical care time)   Date: 10/30/2013  Rate: 119  Rhythm: atrial fibrillation  QRS Axis: right  Intervals: afib  ST/T Wave abnormalities: nonspecific ST/T changes  Conduction Disutrbances:afib  Narrative Interpretation:   Old EKG Reviewed: none available  Results for  orders placed during the hospital encounter of 10/30/13  CBC WITH DIFFERENTIAL      Result Value Range   WBC 19.5 (*) 4.0 - 10.5 K/uL   RBC 4.64  4.22 - 5.81 MIL/uL   Hemoglobin 16.2  13.0 - 17.0 g/dL   HCT 48.1  39.0 - 52.0 %   MCV 103.7 (*) 78.0 - 100.0 fL   MCH 34.9 (*) 26.0 - 34.0 pg   MCHC 33.7  30.0 - 36.0 g/dL   RDW 14.2  11.5 - 15.5 %   Platelets 285  150 - 400 K/uL   Neutrophils Relative % 76  43 - 77 %   Neutro Abs 14.8 (*) 1.7 - 7.7 K/uL   Lymphocytes Relative 13  12 - 46 %   Lymphs Abs 2.5  0.7 - 4.0 K/uL   Monocytes Relative 9  3 - 12 %   Monocytes Absolute 1.8 (*) 0.1 - 1.0 K/uL   Eosinophils Relative 2  0 - 5 %   Eosinophils Absolute 0.4  0.0 - 0.7 K/uL   Basophils Relative 0  0 - 1 %   Basophils Absolute 0.0  0.0 - 0.1 K/uL  PROTIME-INR      Result Value Range   Prothrombin Time 31.8 (*) 11.6 - 15.2 seconds   INR 3.23 (*) 0.00 - 1.49  POCT I-STAT TROPONIN I      Result Value Range   Troponin i, poc 0.01  0.00 - 0.08 ng/mL   Comment 3            Dg Chest Port 1 View  10/30/2013   CLINICAL DATA:  Shortness of Breath  EXAM: PORTABLE CHEST - 1 VIEW  COMPARISON:  06/27/2013  FINDINGS: Cardiomediastinal silhouette is stable. There is streaky airspace disease in left midlung highly suspicious for pneumonia. Emphysematous changes again noted. No pulmonary edema.  IMPRESSION: Streaky airspace disease left midlung highly suspicious for pneumonia. Follow-up to resolution is recommended.   Electronically Signed   By: Lahoma Crocker M.D.   On: 10/30/2013 18:07      MDM  Iv ns. Continuous pulse ox and monitor. Cxr. Ecg. Labs.  Room air pulse ox in 70's.  2 liters 80's.  On NRB mask, sat 94%.    Reviewed nursing notes and prior charts for additional history.   pna on cxr. No recent admission/inpt stay. Will rx for suspected cap.   Rocephin and zithromax iv.   Albuterol and atrovent neb re wheezing.   Able to wean off non re-breathing mask to nasal cannula.   Dyspnea/hypoxia persists.  Med service contact for admission.   CRITICAL CARE  Acute respiratory distress, pneumonia, severe hypoxia, tachycardia, tachypnea Performed by: Mirna Mires Total critical care time: 35 Critical care time was exclusive of separately billable procedures and treating other patients. Critical care was necessary to treat or prevent imminent or life-threatening deterioration. Critical care was time spent personally by me on the following activities: development of treatment plan with patient and/or surrogate as well as nursing, discussions with consultants, evaluation of patient's response to treatment, examination of patient, obtaining history from patient or surrogate, ordering and performing treatments and interventions, ordering and review of laboratory studies, ordering and review of radiographic studies, pulse oximetry and re-evaluation of patient's condition.    Mirna Mires, MD 10/30/13 [phone]

## 2013-10-30 NOTE — ED Notes (Signed)
Pt is here with sob, congested sounding and sats 66% RA, tachypneic, and using accessory muscle use

## 2013-10-31 ENCOUNTER — Inpatient Hospital Stay (HOSPITAL_COMMUNITY): Payer: Medicare Other

## 2013-10-31 DIAGNOSIS — R0902 Hypoxemia: Secondary | ICD-10-CM

## 2013-10-31 DIAGNOSIS — I509 Heart failure, unspecified: Secondary | ICD-10-CM

## 2013-10-31 DIAGNOSIS — I4891 Unspecified atrial fibrillation: Secondary | ICD-10-CM

## 2013-10-31 DIAGNOSIS — J189 Pneumonia, unspecified organism: Secondary | ICD-10-CM

## 2013-10-31 DIAGNOSIS — I5032 Chronic diastolic (congestive) heart failure: Secondary | ICD-10-CM

## 2013-10-31 LAB — BLOOD GAS, ARTERIAL
Acid-Base Excess: 1.7 mmol/L (ref 0.0–2.0)
BICARBONATE: 28 meq/L — AB (ref 20.0–24.0)
Drawn by: 23404
FIO2: 100 %
O2 SAT: 97 %
PATIENT TEMPERATURE: 99.3
TCO2: 30 mmol/L (ref 0–100)
pCO2 arterial: 64.7 mmHg (ref 35.0–45.0)
pH, Arterial: 7.262 — ABNORMAL LOW (ref 7.350–7.450)
pO2, Arterial: 103 mmHg — ABNORMAL HIGH (ref 80.0–100.0)

## 2013-10-31 LAB — TROPONIN I

## 2013-10-31 LAB — PROTIME-INR
INR: 2.85 — ABNORMAL HIGH (ref 0.00–1.49)
Prothrombin Time: 28.9 seconds — ABNORMAL HIGH (ref 11.6–15.2)

## 2013-10-31 LAB — MRSA PCR SCREENING: MRSA by PCR: NEGATIVE

## 2013-10-31 LAB — LIPID PANEL
CHOL/HDL RATIO: 2.7 ratio
Cholesterol: 136 mg/dL (ref 0–200)
HDL: 50 mg/dL (ref >39)
LDL CALC: 71 mg/dL (ref 0–99)
Triglycerides: 77 mg/dL (ref <150)
VLDL: 15 mg/dL (ref 0–40)

## 2013-10-31 LAB — LACTIC ACID, PLASMA: Lactic Acid, Venous: 2.9 mmol/L — ABNORMAL HIGH (ref 0.5–2.2)

## 2013-10-31 LAB — INFLUENZA PANEL BY PCR (TYPE A & B)
H1N1FLUPCR: NOT DETECTED
INFLAPCR: NEGATIVE
Influenza B By PCR: NEGATIVE

## 2013-10-31 LAB — TSH: TSH: 1.064 u[IU]/mL (ref 0.350–4.500)

## 2013-10-31 LAB — MAGNESIUM: MAGNESIUM: 2.4 mg/dL (ref 1.5–2.5)

## 2013-10-31 LAB — PROCALCITONIN: Procalcitonin: 2.41 ng/mL

## 2013-10-31 MED ORDER — SODIUM CHLORIDE 0.9 % IV SOLN
250.0000 mg | Freq: Three times a day (TID) | INTRAVENOUS | Status: DC
  Administered 2013-10-31: 250 mg via INTRAVENOUS
  Filled 2013-10-31 (×4): qty 250

## 2013-10-31 MED ORDER — SODIUM CHLORIDE 0.9 % IV BOLUS (SEPSIS)
500.0000 mL | Freq: Once | INTRAVENOUS | Status: AC
  Administered 2013-10-31: 500 mL via INTRAVENOUS

## 2013-10-31 MED ORDER — WARFARIN SODIUM 2 MG PO TABS
2.0000 mg | ORAL_TABLET | Freq: Once | ORAL | Status: AC
  Administered 2013-10-31: 2 mg via ORAL
  Filled 2013-10-31: qty 1

## 2013-10-31 MED ORDER — OSELTAMIVIR PHOSPHATE 30 MG PO CAPS
30.0000 mg | ORAL_CAPSULE | Freq: Two times a day (BID) | ORAL | Status: DC
  Administered 2013-10-31: 30 mg via ORAL
  Filled 2013-10-31 (×2): qty 1

## 2013-10-31 MED ORDER — ALBUTEROL SULFATE (2.5 MG/3ML) 0.083% IN NEBU
2.5000 mg | INHALATION_SOLUTION | Freq: Four times a day (QID) | RESPIRATORY_TRACT | Status: DC | PRN
  Administered 2013-10-31 – 2013-11-03 (×2): 2.5 mg via RESPIRATORY_TRACT
  Filled 2013-10-31 (×2): qty 3

## 2013-10-31 MED ORDER — WARFARIN - PHARMACIST DOSING INPATIENT: Freq: Every day | Status: DC

## 2013-10-31 MED ORDER — VANCOMYCIN HCL IN DEXTROSE 750-5 MG/150ML-% IV SOLN
750.0000 mg | INTRAVENOUS | Status: DC
  Administered 2013-10-31 – 2013-11-03 (×4): 750 mg via INTRAVENOUS
  Filled 2013-10-31 (×5): qty 150

## 2013-10-31 MED ORDER — SODIUM CHLORIDE 0.9 % IV SOLN
250.0000 mg | Freq: Three times a day (TID) | INTRAVENOUS | Status: DC
  Administered 2013-10-31 – 2013-11-03 (×9): 250 mg via INTRAVENOUS
  Filled 2013-10-31 (×11): qty 250

## 2013-10-31 NOTE — Progress Notes (Signed)
MD on the floor ABG ordered results relayed to Dr. Paulla Fore, Pt trialed on Bipap  Could not tolerate saturation 85-91% on 100% NRB,  Dr. Paulla Fore spoke with wife, pt states that he feels better will continue to monitor

## 2013-10-31 NOTE — Progress Notes (Addendum)
ANTIBIOTIC CONSULT NOTE - INITIAL  Pharmacy Consult for Vancomycin  Indication: pneumonia  No Known Allergies  Patient Measurements: Height: 5\' 7"  (170.2 cm) Weight: 122 lb 5.7 oz (55.5 kg) IBW/kg (Calculated) : 66.1  Vital Signs: Temp: 99.4 F (37.4 C) (01/11 2300) Temp src: Oral (01/11 2300) BP: 96/49 mmHg (01/12 0225) Pulse Rate: 88 (01/12 0225) Intake/Output from previous day: 01/11 0701 - 01/12 0700 In: 20 [I.V.:20] Out: 150 [Urine:150] Intake/Output from this shift: Total I/O In: 20 [I.V.:20] Out: 150 [Urine:150]  Labs:  Recent Labs  10/30/13 1739  WBC 19.5*  HGB 16.2  PLT 285  CREATININE 1.17   Estimated Creatinine Clearance: 35.6 ml/min (by C-G formula based on Cr of 1.17). No results found for this basename: VANCOTROUGH, VANCOPEAK, VANCORANDOM, GENTTROUGH, GENTPEAK, GENTRANDOM, TOBRATROUGH, TOBRAPEAK, TOBRARND, AMIKACINPEAK, AMIKACINTROU, AMIKACIN,  in the last 72 hours   Microbiology: No results found for this or any previous visit (from the past 720 hour(s)).  Medical History: Past Medical History  Diagnosis Date  . Chronic atrial fibrillation   . Dilated cardiomyopathy   . PVC's (premature ventricular contractions)   . Hyperlipidemia   . COPD (chronic obstructive pulmonary disease)   . Hypothyroidism   . Renal calculi   . Chronic anticoagulation   . CHF (congestive heart failure)     DUE TO SYSTOLIC DYSFUNCTION  . Shortness of breath     exertional  . Cancer     vocal cord - radiation    Assessment: 80 y/o M being treated for CAP, however, MD is concerned that patient may have had the flu a couple of weeks ago and wants to cover for MRSA PNA with vancomycin. WBC 19.5, other labs as above.   Goal of Therapy:  Vancomycin trough level 15-20 mcg/ml  Plan:  -Vancomycin 750 mg IV q24h -Trend WBC, temp, renal function  -Drug levels as indicated   Vernon Ariel 10/31/2013,3:17 AM  Switching to Primaxin  -DC Ceftriaxone/Azithro -Give  Primaxin 250 mg IV q8h Sharlynn Oliphant, PharmD

## 2013-10-31 NOTE — Progress Notes (Signed)
MD at the bedside wants to trail patient on Bipap and get an ABG RT notified will continue to monitor

## 2013-10-31 NOTE — Progress Notes (Signed)
Family Medicine Teaching Service Service Pager: [phone]  IMRE VECCHIONE 80 y.o. male  MRN: [id number]  DOB: December 13, 1924    LOS: 1 day  DNR PCP: Leonard Downing, MD  CONSULTANTS: Cardiology ([place])   PATIENT OVERVIEW  He was admitted on 10/30/2013 for acute respiratory distress. Being treated for CAP with Vanc for ?post flu Influenza in the setting of recent respiratory illness and now acute decompensation.  His PMHx is significant for CHF with dilated cardiomyopathy, AFib HTN, HLD, Hypothyroidism, solitary pulmonary nodule and likely COPD by CXR.  1/11 - Hypoxic in ED to 70s on RA 1/12 - Profoundly acidotic ABG with increased work of breathing. Intolerant to BiPAP. Flu neg. Escalated abx to Primaxin/Vanc.   Subjective  Breathing better today although he looked acutely worse overnight. Still on 100% O2 nonrebreather. States he feels much better. Patient was able to sit up in bed without difficulty for exam. Very alert and responsive. Talkative but O2 sats in the 80's.  OBJECTIVE  Vitals: Temp:  [97.1 F (36.2 C)-99.4 F (37.4 C)] 97.7 F (36.5 C) (01/12 1100) Pulse Rate:  [61-130] 88 (01/12 1200) Resp:  [21-45] 25 (01/12 1200) BP: (86-142)/(46-81) 98/55 mmHg (01/12 1200) SpO2:  [65 %-100 %] 97 % (01/12 1200) FiO2 (%):  [100 %] 100 % (01/12 1040) Weight:  [122 lb 5.7 oz (55.5 kg)] 122 lb 5.7 oz (55.5 kg) (01/11 2300)  Weight: Baseline Weight: 134(10/27) Filed Weights   10/30/13 2300  Weight: 122 lb 5.7 oz (55.5 kg)   I&Os:  Intake/Output Summary (Last 24 hours) at 10/31/13 1357 Last data filed at 10/31/13 1100  Gross per 24 hour  Intake    180 ml  Output    350 ml  Net   -170 ml   PHYSICAL EXAM: GENERAL: Elderly, cachectic caucasian male. In no discomfort; no respiratory distress.  PSYCH: alert and appropriate, good insight   HNEENT: H&N: AT/[place], trachea midline  Eyes: no scleral icterus, no conjunctival exudate  Ears:   Nose:   Oropharynx: Mildly dry  Mucous membranes. No exudate  Dentention:     CARDIO:  irregularly irregular  LUNGS:  overall poor air movement.  Slight expiratory wheezing.  Use of accessory muscles with mild belly breathing. Crackles in the L lung base.  ABDOMEN: +BS, soft, non-tender, no rigidity, no guarding, no masses/hepatosplenomegaly  EXTREM:  Warm, well perfused.  Moves all 4 extremities spontaneously; no lateralization. Distal pulses trace.  No pretibial edema.  GU:   SKIN:    IMAGING: 1/11 - 1V CX -  Hyper expanded, left mid lung streaky airspace disease highly suspicious for pneumonia. 1/12 - 1V CX - FINDINGS: Worsening consolidation in the left mid and lower lung. The right lung remains relatively well aerated, although extensively emphysematous. No change in heart size. Atelectasis contributes to the increase opacification, given volume loss on the left. No evidence of increasing pleural fluid or cavitation.  IMPRESSION:  Increased opacification of the left lower chest, a combination of worsening pneumonia and new atelectasis.  LABS: Daily Others:   Recent Labs Lab 10/30/13 1739  WBC 19.5*  HGB 16.2  HCT 48.1  PLT 285    Recent Labs Lab 10/30/13 1739  NA 142  K 5.1  CL 102  CO2 32  BUN 33*  CREATININE 1.17  GLUCOSE 194*  CALCIUM 9.1    Recent Labs Lab 10/30/13 1739 10/30/13 1754 10/30/13 2025 10/31/13 0355 10/31/13 0827  TROPIPOC  --  0.01  --   --   --  TROPONINI <0.30  --  <0.30 <0.30 <0.30  PROBNP 982.1*  --   --   --   --       Recent Labs Lab 10/30/13 1739 10/30/13 1753  DIGOXIN 0.9 0.8       Recent Labs Lab 10/30/13  MG 2.4    Recent Labs Lab 10/30/13 1739  ALT 29  AST 33  ALKPHOS 86  BILITOT 0.5    Recent Labs Lab 10/30/13 1739  PROT 7.4  ALBUMIN 3.1*    Recent Labs Lab 10/30/13 1739 10/31/13 0355  INR 3.23* 2.85*    Recent Labs Lab 10/31/13 0255  PHART 7.262*  PCO2ART 64.7*  PO2ART 103.0*  HCO3 28.0*  TCO2 30.0  O2SAT 97.0     Recent Labs Lab 10/31/13 0349  PROCALCITON 2.41  Lactic Acid 2.9  URINE STUDIES: none  MICRO: ABX: MICRO:  1/11 - Rocephin >> 1/12 1/11 - Azithro >> 1/12 1/11 - Tamiflu >> 1/12 1/12 - Vanco >> 1/12 - Primaxin >> 1/11 - Urine Legionella/Strep Pneumonia 1/11 - Viral Panel: Flu negative 1/12   Medications:  Continuous meds: . diltiazem (CARDIZEM) infusion 5 mg/hr (10/31/13 1100)   Scheduled meds: . aspirin EC  81 mg Oral Daily  . atorvastatin  40 mg Oral q1800  . digoxin  0.125 mg Oral Daily  . diltiazem  20 mg Intravenous Once  . imipenem-cilastatin  250 mg Intravenous Q8H  . levothyroxine  75 mcg Oral QAC breakfast  . oseltamivir  30 mg Oral BID  . vancomycin  750 mg Intravenous Q24H  . warfarin  2 mg Oral ONCE-1800  . Warfarin - Pharmacist Dosing Inpatient   Does not apply q1800   PRN meds: acetaminophen, ondansetron (ZOFRAN) IV Assessment & Plan  80 y/o male here with with acute respiratory distress. Being treated for CAP with Vanc for ?post flu Influenza in the setting of recent respiratory illness and now acute decompensation.  His PMHx is significant for CHF with dilated cardiomyopathy, AFib HTN, HLD, Hypothyroidism, solitary pulmonary nodule and likely COPD by CXR.   PULM: Acute respiratory failure, CAP, Hypoxia, leukocytosis, Hx of Pulmonary nodule - Supplemental O2 for O2 Sat <92%, currently on non-rebreather and WOB improving - Pulmonary toilet - Continue Vanc for potential post-influenza MRSA pna - Start Primaxin, discontinue Azithro, Rocephin, Tamiflu (Flu negative 1/12) - dc precautions - Failed BiPAP with worsening O2 saturations - hold pain medications as pt does not have any significant discomfort - encourage cough - DC tamiflu as he is flu negative  Cardiac: A Fib with RVR, CHF, Hypotension, PVCs, HLD - troponins negative x4 - ECHO sometime today - Continue dilt drip for RVR that began in the ED - Discussed with Dr. Wynonia Lawman who agrees this is  likely primary pulmonary.  [facility] aware - coumadin per pharmacy - continue ASA, Lipitor, Digoxin (therapeutic) - hold metoprolol and lasix given hypotension - PVCs chronic with doublets evident on telemetry; monitor  Sepsis with hypotension and severe respiratory acidosis: - Significant CHF hx, stopped fluids today. - Sepsis Markers - Lactic acid 2.9 and PCT 2.41 - repeat PCT in am  Hypothyroidism: - continue home synthroid  HTN - hypotensive associated with sepsis - hold valsartan and metoprolol  Hx of Vocal Cord CA s/p radiation  FEN/GI: Give 1L NS bolus PROPHYLAXIS: On Heparin DIET: Cardiac  Disposition  To SDU in guarded condition.  Updated wife at 0400 given acute worsening.  Confirmed DNR/DNI status with pt at admission.  Thank you,  Charline Bills, Med Student IV   I have examined the patient with the medical student and agree with her documentation as stated above. My annotations are in blue.   Physical exam Gen: mild distress, lying in bed, sits up easily in bed.  HEENT: NCAT CV: irregularly irregular, regular rate, No murmur Resp: decreased air movement, slight exp wheeze, mild inc WOB, on Non-rebreather Abd: SNTND, BS present, no guarding or organomegaly Ext: No edema, warm Neuro: Alert and oriented, No gross deficits  Laroy Apple, MD [place] Resident, PGY-2 10/31/2013, 2:36 PM

## 2013-10-31 NOTE — Progress Notes (Signed)
Utilization review completed.  

## 2013-10-31 NOTE — Progress Notes (Signed)
ANTICOAGULATION CONSULT NOTE  Pharmacy Consult for Warfarin  Indication: atrial fibrillation  No Known Allergies  Patient Measurements: Height: 5\' 7"  (170.2 cm) Weight: 122 lb 5.7 oz (55.5 kg) IBW/kg (Calculated) : 66.1  Labs:  Recent Labs  10/30/13 1739 10/30/13 2025 10/31/13 0355 10/31/13 0827  HGB 16.2  --   --   --   HCT 48.1  --   --   --   PLT 285  --   --   --   LABPROT 31.8*  --  28.9*  --   INR 3.23*  --  2.85*  --   CREATININE 1.17  --   --   --   TROPONINI <0.30 <0.30 <0.30 <0.30    Medications:  Warfarin PTA: 4.5 mg on Sun/Tues, 3 mg all other days  Assessment: 80 y/o M here with progressive dyspnea. On chronic warfarin for afib. INR was 3.23.   INR today = 2.85  Goal of Therapy:  INR 2-3 Monitor platelets by anticoagulation protocol: Yes   Plan:  Coumadin 2 mg po x 1 dose today Daily INR  Thank you. Anette Guarneri, PharmD [phone]  10/31/2013,9:40 AM

## 2013-10-31 NOTE — Progress Notes (Signed)
FMTS Attending Note  I personally saw and evaluated the patient. The plan of care was discussed with the resident team. I agree with the assessment and plan as documented by the resident.   Patient off nonrebreather, tolerating 6L [place], able to eat lunch, patient feels much better than when admitted Agree with treatment as outlined in the resident note.  Dossie Arbour MD

## 2013-10-31 NOTE — Progress Notes (Signed)
Called to room to obtain and ABG and place pt on BIPAP per MD, patient has increased WOB, RR 30's, BBS crs throughout. Obtained ABG.  ABG    Component Value Date/Time   PHART 7.262* 10/31/2013 0255   PCO2ART 64.7* 10/31/2013 0255   PO2ART 103.0* 10/31/2013 0255   HCO3 28.0* 10/31/2013 0255   TCO2 30.0 10/31/2013 0255   O2SAT 97.0 10/31/2013 0255   Placed patient on NIV/PS 14/5 100%, pt tolerating well at this time, will continue to monitor.

## 2013-10-31 NOTE — H&P (Addendum)
FMTS Attending Admission Note: Logan Gentzler,MD I  have seen and examined this patient, reviewed their chart. I have discussed this patient with the resident. I agree with the resident's findings, assessment and care plan.   80 y/o M with pmx of Afib,CHF,HLD,presented with 3 wks of worsening SOB and chest tightness with mild cough,he denies any fever,no sputum production,no sick contact.Patient feeling much better now on oxygen.  Filed Vitals:   10/31/13 0314 10/31/13 0355 10/31/13 0400 10/31/13 0754  BP: 88/46 108/52 105/56   Pulse: 78 89 108   Temp:  97.8 F (36.6 C)  97.1 F (36.2 C)  TempSrc:  Axillary  Axillary  Resp: 32 28 22   Height:      Weight:      SpO2: 96% 86% 80%    Exam: Gen/Resp: Mildly increase work of breathing. Air entry equal B/L but diminished,with mild basal crepitation on the left. CV: S1 S2 normal,no murmurs,irregular rhythm. Abd: Soft,NT/ND,BS+ and normal. Ext: No edema  A/P; 80 Y/O M with  1. Dyspnea: Left LL Pneumonia     Patient hypoxemic on non-rebreather mask.     He seem to be improving on Oxygen.    On Vanc and Primaxin for his pneumonia.  2. CHF/Afib:      ProBNP 900 with no fluid retention,Lasix not warranted at this time.      Continue Cardizem.      Anticoagulant per pharmacy.       Patient signed off to [facility] @ 8:30am on 10/31/13

## 2013-10-31 NOTE — Progress Notes (Signed)
Removed pt from NIV per pt request, pt states crushing chest pain and MD at bedside. Placed pt on 100% NRB SPO2 90% awaiting further orders.

## 2013-11-01 DIAGNOSIS — E785 Hyperlipidemia, unspecified: Secondary | ICD-10-CM

## 2013-11-01 DIAGNOSIS — I5033 Acute on chronic diastolic (congestive) heart failure: Secondary | ICD-10-CM

## 2013-11-01 DIAGNOSIS — J984 Other disorders of lung: Secondary | ICD-10-CM

## 2013-11-01 DIAGNOSIS — Z7901 Long term (current) use of anticoagulants: Secondary | ICD-10-CM

## 2013-11-01 LAB — LEGIONELLA ANTIGEN, URINE: Legionella Antigen, Urine: NEGATIVE

## 2013-11-01 LAB — PROTIME-INR
INR: 4.2 — ABNORMAL HIGH (ref 0.00–1.49)
Prothrombin Time: 38.9 seconds — ABNORMAL HIGH (ref 11.6–15.2)

## 2013-11-01 LAB — STREP PNEUMONIAE URINARY ANTIGEN: Strep Pneumo Urinary Antigen: NEGATIVE

## 2013-11-01 MED ORDER — METOPROLOL TARTRATE 12.5 MG HALF TABLET
12.5000 mg | ORAL_TABLET | Freq: Two times a day (BID) | ORAL | Status: DC
  Administered 2013-11-01 – 2013-11-04 (×7): 12.5 mg via ORAL
  Filled 2013-11-01 (×8): qty 1

## 2013-11-01 NOTE — Progress Notes (Signed)
OT Cancellation Note  Patient Details Name: Logan [name] MRN: [id number] DOB: [birth year]   Cancelled Treatment:    Reason Eval/Treat Not Completed: Other (comment) (pt fatigued this pm and asked OT to return in am. will assess in am)  [facility], OTR/L  [phone] 11/01/2013 11/01/2013, 5:35 PM

## 2013-11-01 NOTE — Consult Note (Signed)
CARDIOLOGY CONSULT NOTE  Patient ID: Logan [name], MRN: [id number], DOB/AGE: 1925-04-01 80 y.o. Admit date: 10/30/2013 Date of Consult: 11/01/2013  Primary Physician: Leonard Downing, MD Primary Cardiologist: Dr [place] Referring Physician: Dr Berkley Harvey  Chief Complaint: Shortness of breath, cough Reason for Consultation: atrial fibrillation with RVR  HPI: 80 y.o. male w/ PMHx significant for permanent atrial fibrillation who presented to [facility] on 10/30/13 with complaints of shortness of breath and cough. He was diagnosed with community-acquired pneumonia. He is improving on usual treatment. However, he has been noted to have atrial fibrillation with elevated heart rates and we are asked to see him for help with management.  The patient has permanent atrial fibrillation. He is chronically anticoagulated. His rate is controlled as an outpatient with metoprolol and digoxin. He has a history of nonischemic dilated cardiomyopathy dating back to 2008. However, repeat echocardiogram in 2013 demonstrated normalization of left ventricular function.  From a symptomatic perspective, the patient has had cough with increasing shortness of breath. He has a home oxygen saturation probe and noted saturations in the 70s. This prompted his hospitalization. He's had no edema, orthopnea, or PND. He denies palpitations or chest pain.    Past Medical History  Diagnosis Date  . Chronic atrial fibrillation   . Dilated cardiomyopathy   . PVC's (premature ventricular contractions)   . Hyperlipidemia   . COPD (chronic obstructive pulmonary disease)   . Hypothyroidism   . Renal calculi   . Chronic anticoagulation   . CHF (congestive heart failure)     DUE TO SYSTOLIC DYSFUNCTION  . Shortness of breath     exertional  . Cancer     vocal cord - radiation       Surgical History:  Past Surgical History  Procedure Laterality Date  . Cardiac catheterization  02/26/2007    EF 35-40%  .  Appendectomy    . Tonsillectomy and adenoidectomy    . US echocardiography  07/08/2010    EF 50-55%  . US echocardiography  02/16/2007    EF 25-35%  . Transthoracic echocardiogram  08/26/2001    EF 25-30%  . Cardiovascular stress test  02/18/2007    EF 40%  . Microlaryngoscopy with co2 laser and excision of vocal cord lesion      15 years ago  . Lithotripsy    . Inguinal hernia repair Right 12/28/2012    Procedure: HERNIA REPAIR INGUINAL ADULT;  Surgeon: Joyice Faster. Cornett, MD;  Location: [place];  Service: General;  Laterality: Right;     Home Meds: Prior to Admission medications   Medication Sig Start Date End Date Taking? Authorizing Provider  Calcium-Magnesium-Zinc (CAL-MAG-ZINC PO) Take 1 tablet by mouth daily.    Yes Historical Provider, MD  digoxin (LANOXIN) 0.125 MG tablet Take 1 tablet (0.125 mg total) by mouth daily. 08/15/13  Yes Peter M [place], MD  fish oil-omega-3 fatty acids 1000 MG capsule Take 1 g by mouth daily.    Yes Historical Provider, MD  Flaxseed, Linseed, (FLAX SEED OIL PO) Take 1 tablet by mouth daily.    Yes Historical Provider, MD  furosemide (LASIX) 20 MG tablet Take 1 tablet (20 mg total) by mouth daily. 02/14/13  Yes Peter M [place], MD  HYDROcodone-acetaminophen (NORCO) 5-325 MG per tablet Take 1 tablet by mouth every 6 (six) hours as needed for pain. 12/28/12  Yes Thomas A. Cornett, MD  levothyroxine (SYNTHROID, LEVOTHROID) 75 MCG tablet Take 75 mcg by mouth daily before breakfast.  Yes Historical Provider, MD  metoprolol (LOPRESSOR) 50 MG tablet Take 1.5 tablets (75 mg total) by mouth 2 (two) times daily. 02/18/13  Yes Peter M [place], MD  Multiple Vitamin (MULTIVITAMIN) tablet Take 1 tablet by mouth daily.    Yes Historical Provider, MD  simvastatin (ZOCOR) 80 MG tablet Take 1 tablet (80 mg total) by mouth at bedtime. 02/14/13  Yes Peter M [place], MD  valsartan (DIOVAN) 160 MG tablet Take 1 tablet (160 mg total) by mouth 2 (two) times daily. 02/14/13  Yes Peter M  [place], MD  warfarin (COUMADIN) 3 MG tablet Take 3-4.5 mg by mouth daily. Sundays and Tuesday takes 4.5mg  All other days 3mg    Yes Historical Provider, MD  amoxicillin-clavulanate (AUGMENTIN) 875-125 MG per tablet Take 1 tablet by mouth 2 (two) times daily.    Historical Provider, MD  predniSONE (STERAPRED UNI-PAK) 10 MG tablet Take 1-6 tablets by mouth daily.    Historical Provider, MD    Inpatient Medications:  . aspirin EC  81 mg Oral Daily  . atorvastatin  40 mg Oral q1800  . digoxin  0.125 mg Oral Daily  . diltiazem  20 mg Intravenous Once  . imipenem-cilastatin  250 mg Intravenous Q8H  . levothyroxine  75 mcg Oral QAC breakfast  . vancomycin  750 mg Intravenous Q24H  . Warfarin - Pharmacist Dosing Inpatient   Does not apply q1800      Allergies: No Known Allergies  History   Social History  . Marital Status: Married    Spouse Name: N/A    Number of Children: 0  . Years of Education: N/A   Occupational History  . electrician    Social History Main Topics  . Smoking status: Former Smoker -- 1.00 packs/day for 35 years    Quit date: 07/12/1991  . Smokeless tobacco: Not on file  . Alcohol Use: No  . Drug Use: No  . Sexual Activity: Not on file   Other Topics Concern  . Not on file   Social History Narrative  . No narrative on file     Family History  Problem Relation Age of Onset  . Stroke Mother   . Seizures Mother   . Heart attack Father   . Heart failure Father   . Heart attack Brother      Review of Systems: General: negative for chills, fever, night sweats or weight changes.  ENT: negative for rhinorrhea or epistaxis Cardiovascular: See history of present illness Dermatological: negative for rash Respiratory: See history of present illness. Predating this hospitalization the patient has significant dyspnea with exertion. He is very sedentary. GI: negative for nausea, vomiting, diarrhea, bright red blood per rectum, melena, or hematemesis GU: no  hematuria, urgency, or frequency Neurologic: negative for visual changes, syncope, headache, or dizziness Heme: no easy bruising or bleeding Endo: negative for excessive thirst, thyroid disorder, or flushing Musculoskeletal: negative for joint pain or swelling, negative for myalgias All other systems reviewed and are otherwise negative except as noted above.  Physical Exam: Blood pressure 141/65, pulse 105, temperature 97.9 F (36.6 C), temperature source Oral, resp. rate 28, height 5\' 7"  (1.702 m), weight 55.5 kg (122 lb 5.7 oz), SpO2 88.00%. General: Well developed, well nourished, alert and oriented, pleasant elderly gentleman in no acute distress. HEENT: Normocephalic, atraumatic, sclera non-icteric, no xanthomas, nares are without discharge.  Neck: Supple. Carotids 2+ with a left carotid bruit. JVP normal Lungs: Diminished bilaterally, inspiratory crackles bilaterally. Heart: Irregularly irregular with normal S1  and S2. No murmurs, rubs, or gallops appreciated. During my evaluation his heart rate ranged between 105 and 120 beats per minute Abdomen: Soft, non-tender, non-distended with normoactive bowel sounds. No hepatomegaly. No rebound/guarding. No obvious abdominal masses. Back: No CVA tenderness Msk:  Strength and tone appear normal for age. Extremities: No clubbing, cyanosis, or edema.  Distal pedal pulses are 2+ and equal bilaterally. Neuro: CNII-XII intact, moves all extremities spontaneously. Psych:  Responds to questions appropriately with a normal affect.    Labs:  Recent Labs  10/30/13 1739 10/30/13 2025 10/31/13 0355 10/31/13 0827  TROPONINI <0.30 <0.30 <0.30 <0.30   Lab Results  Component Value Date   WBC 19.5* 10/30/2013   HGB 16.2 10/30/2013   HCT 48.1 10/30/2013   MCV 103.7* 10/30/2013   PLT 285 10/30/2013    Recent Labs Lab 10/30/13 1739  NA 142  K 5.1  CL 102  CO2 32  BUN 33*  CREATININE 1.17  CALCIUM 9.1  PROT 7.4  BILITOT 0.5  ALKPHOS 86  ALT  29  AST 33  GLUCOSE 194*   Lab Results  Component Value Date   CHOL 136 10/31/2013   HDL 50 10/31/2013   LDLCALC 71 10/31/2013   TRIG 77 10/31/2013   No results found for this basename: DDIMER    Radiology/Studies:  Dg Chest Port 1 View  10/31/2013   CLINICAL DATA:  Pneumonia.  Shortness of breath  EXAM: PORTABLE CHEST - 1 VIEW  COMPARISON:  Chest x-ray from yesterday  FINDINGS: Worsening consolidation in the left mid and lower lung. The right lung remains relatively well aerated, although extensively emphysematous. No change in heart size. Atelectasis contributes to the increase opacification, given volume loss on the left. No evidence of increasing pleural fluid or cavitation.  IMPRESSION: Increased opacification of the left lower chest, a combination of worsening pneumonia and new atelectasis.   Electronically Signed   By: Jorje Guild M.D.   On: 10/31/2013 04:18   Dg Chest Port 1 View  10/30/2013   CLINICAL DATA:  Shortness of Breath  EXAM: PORTABLE CHEST - 1 VIEW  COMPARISON:  06/27/2013  FINDINGS: Cardiomediastinal silhouette is stable. There is streaky airspace disease in left midlung highly suspicious for pneumonia. Emphysematous changes again noted. No pulmonary edema.  IMPRESSION: Streaky airspace disease left midlung highly suspicious for pneumonia. Follow-up to resolution is recommended.   Electronically Signed   By: Lahoma Crocker M.D.   On: 10/30/2013 18:07    EKG: 10/31/2013 3:34 AM: Atrial fibrillation heart rate 90 beats per minute, age-indeterminate septal infarct, cannot rule out age-indeterminate lateral infarct.  ASSESSMENT AND PLAN:  Atrial fibrillation with RVR. The patient has permanent atrial fibrillation. His elevated heart rates are related to pulmonary illness. I don't think his heart rate needs to be aggressively controlled. There have been issues with hypotension. All antihypertensives are on hold at the present time. He remains on digoxin 0.125 mg daily. I have  reviewed his blood pressures from today and systolic pressures are now greater than 120. I think a low-dose beta blocker could be carefully added. Will start metoprolol 12.5 mg twice daily with parameters to hold this for systolic blood pressure less than 100. Labs reviewed and note that troponins are negative, blood counts are stable, and INR is elevated today at 4.2. Warfarin is being managed by the pharmacy. Thyroid studies are within normal limits. 2-D echocardiogram is pending. There are no clinical signs of congestive heart failure.  Signed, Sherren Mocha  11/01/2013, 10:10 AM

## 2013-11-01 NOTE — Progress Notes (Signed)
FMTS Attending Note  I personally saw and evaluated the patient. The plan of care was discussed with the resident team. I agree with the assessment and plan as documented by the resident.   Patient continues to show significant improvement of his respiratory status, currently on 4 L [place], up in chair, tolerating diet  1. Acute Respiratory Failure/CAP - continue Vancomycin/Primaxin day #2, transition to PO in next 24-48 hours if continues to show marked improvement 2. Atrial Fibrillation with RVR - appreciate cardiology recommendations, continue treatment as outlined in resident note 3. Hypotension - improved since admission, monitor closely with addition of Metoprolol today 4. Chronic medical conditions stable  Disposition: likely out of step-down tomorrow  Dossie Arbour MD

## 2013-11-01 NOTE — Evaluation (Signed)
Physical Therapy Evaluation Patient Details Name: Logan [name] MRN: [id number] DOB: May 03, 1925 Today's Date: 11/01/2013 Time: 7619-5093 PT Time Calculation (min): 18 min  PT Assessment / Plan / Recommendation History of Present Illness  80 y/o M with pmx of Afib,CHF,HLD,presented with 3 wks of worsening SOB and chest tightness with mild cough,he denies any fever,no sputum production,no sick contact.Patient feeling much better now on oxygen  Clinical Impression  Pt functioning near baseline however demonstrates mild balance impairment as demo'd by cautious gait pattern.Pt with noted SpO2 desaturation during ambulation on 4LO2 via [place]. Suspect pt to progress well enough to dc home with supervision of spouse and HHPT to educate on how to manage mobility with home O2 if pt con't to need it at d/c.    PT Assessment  Patient needs continued PT services    Follow Up Recommendations  Home health PT;Supervision/Assistance - 24 hour    Does the patient have the potential to tolerate intense rehabilitation      Barriers to Discharge        Equipment Recommendations  None recommended by PT    Recommendations for Other Services     Frequency Min 3X/week    Precautions / Restrictions Precautions Precautions: Fall Precaution Comments: Pt RR 40 during ambulation. watch SpO2 Restrictions Weight Bearing Restrictions: No   Pertinent Vitals/Pain SATURATION QUALIFICATIONS: (This note is used to comply with regulatory documentation for home oxygen)  Patient Saturations on 2LO2 at Rest = 88%  Patient Saturations on 4 Liters of oxygen while Ambulating = 90%  Please briefly explain why patient needs home oxygen: pt unable to maintain >88 on RA      Mobility  Bed Mobility Overal bed mobility:  (not tested, pt up in chair upon arrival) Transfers Overall transfer level: Needs assistance Transfers: Sit to/from Stand Sit to Stand: Supervision General transfer comment: ptm with safe  technique Ambulation/Gait Ambulation/Gait assistance: Min guard Ambulation Distance (Feet): 150 Feet Assistive device: None Gait Pattern/deviations: Step-through pattern;Decreased stride length Gait velocity: pt slow and guarded General Gait Details: pt with no episodes of LOB however pt reports "I usually walk faster than this". Pt definately guarded. Pt RR increased into 40s. SpO2 >90% on 4LO2 va [place]    Exercises     PT Diagnosis: Generalized weakness  PT Problem List: Decreased activity tolerance;Decreased strength;Decreased balance;Decreased mobility PT Treatment Interventions: DME instruction;Gait training;Stair training;Functional mobility training;Therapeutic activities;Therapeutic exercise     PT Goals(Current goals can be found in the care plan section) Acute Rehab PT Goals Patient Stated Goal: home when safe PT Goal Formulation: With patient Time For Goal Achievement: 11/08/13 Potential to Achieve Goals: Good Additional Goals Additional Goal #1: Pt to achieve >19 on DGI to indicate minimal falls risk.  Visit Information  Last PT Received On: 11/01/13 Assistance Needed: +1 History of Present Illness: 80 y/o M with pmx of Afib,CHF,HLD,presented with 3 wks of worsening SOB and chest tightness with mild cough,he denies any fever,no sputum production,no sick contact.Patient feeling much better now on oxygen       Prior [place] expects to be discharged to:: Private residence Living Arrangements: Spouse/significant other Available Help at Discharge: Family;Available 24 hours/day Type of Home: House Home Access: Stairs to enter [employer] of Steps: 4 Entrance Stairs-Rails: Left Home Layout: One level Home Equipment: Walker - 2 wheels Prior Function Level of Independence: Independent Comments: was still driving Communication Communication: No difficulties Dominant Hand: Right    Cognition  Cognition Arousal/Alertness:  Awake/alert Behavior During Therapy: WFL for tasks assessed/performed Overall Cognitive Status: Within Functional Limits for tasks assessed    Extremity/Trunk Assessment Upper Extremity Assessment Upper Extremity Assessment: Generalized weakness Lower Extremity Assessment Lower Extremity Assessment: Generalized weakness Cervical / Trunk Assessment Cervical / Trunk Assessment: Normal   Balance Balance Overall balance assessment: Needs assistance Standing balance-Leahy Scale: Fair  End of Session PT - End of Session Equipment Utilized During Treatment: Gait belt Activity Tolerance: Patient tolerated treatment well Patient left: in chair;with call bell/phone within reach;with family/visitor present Nurse Communication: Mobility status  GP     Kingsley Callander 11/01/2013, 3:10 PM  Kittie Plater, PT, DPT Pager #: [phone] Office #: [phone]

## 2013-11-01 NOTE — Progress Notes (Signed)
[place] for Warfarin  Indication: atrial fibrillation  No Known Allergies  Patient Measurements: Height: 5\' 7"  (170.2 cm) Weight: 122 lb 5.7 oz (55.5 kg) IBW/kg (Calculated) : 66.1  Labs:  Recent Labs  10/30/13 1739 10/30/13 2025 10/31/13 0355 10/31/13 0827 11/01/13 0240  HGB 16.2  --   --   --   --   HCT 48.1  --   --   --   --   PLT 285  --   --   --   --   LABPROT 31.8*  --  28.9*  --  38.9*  INR 3.23*  --  2.85*  --  4.20*  CREATININE 1.17  --   --   --   --   TROPONINI <0.30 <0.30 <0.30 <0.30  --     Medications:  Warfarin PTA: 4.5 mg on Sun/Tues, 3 mg all other days  Assessment: 80 y/o M here with progressive dyspnea. On chronic warfarin for afib. INR was 3.23.   INR today = 4.2  Goal of Therapy:  INR 2-3 Monitor platelets by anticoagulation protocol: Yes   Plan:  No Coumadin today Daily INR  Thank you. Anette Guarneri, PharmD [phone]  11/01/2013,9:29 AM

## 2013-11-01 NOTE — Progress Notes (Signed)
Family Medicine Teaching Service Daily Progress Note Intern Pager: [phone]  Patient name: Logan [name] Medical record number: [id number] Date of birth: June 20, 1925 Age: 80 y.o. Gender: male  Primary Care Provider: Leonard Downing, MD Consultants: Cardiology Code Status: DNR  Pt Overview and Major Events to Date:  He was admitted on 10/30/2013 for acute respiratory distress. Being treated for CAP with Vanc for ?post flu Influenza in the setting of recent respiratory illness and now acute decompensation. His PMHx is significant for CHF with dilated cardiomyopathy, AFib HTN, HLD, Hypothyroidism, solitary pulmonary nodule and likely COPD by CXR.  1/11 - Hypoxic in ED to 70s on RA  1/12 - Profoundly acidotic ABG with increased work of breathing. Intolerant to BiPAP. Flu neg. Escalated abx to Primaxin/Vanc.   Assessment and Plan: 80 y/o male here with with acute respiratory distress. Being treated for CAP with Vanc for ?post flu Influenza in the setting of recent respiratory illness and now acute decompensation. His PMHx is significant for CHF with dilated cardiomyopathy, AFib HTN, HLD, Hypothyroidism, solitary pulmonary nodule and likely COPD by CXR.   PULM: Acute respiratory failure, CAP, Hypoxia, leukocytosis, Hx of Pulmonary nodule  - Supplemental O2 for O2 Sat <92%,  - Pulmonary toilet  - Abx: Vanc Day 2 (for potential post-influenza MRSA pna) & Primaxin Day 2  - discontinued Azithro, Rocephin, Tamiflu (Flu negative 1/12) - dc precautions  - [place] @ 5L  - Sputum culture: pending; (Strep Pneumo Neg; Legionella pending) - hold pain medications as pt does not have any significant discomfort  - encourage cough   Cardiac: A Fib with RVR, CHF, Hypotension, PVCs, HLD  - Discussed with Dr. Wynonia Lawman who agrees this is likely primary pulmonary. [facility] aware  - troponins negative x4; PVCs chronic with doublets evident on telemetry. Dilt drip for RVR stopped 1/12 ~ noon  - coumadin  per pharmacy (INR 4.2 on 1/13) - continue ASA, Lipitor, Digoxin (therapeutic)  - holding lasix  given hypotension  - Cardiology Consult  - Metoprolol 12.5 BID restarted - ECHO: pending  Sepsis with hypotension: Resolved   - Significant CHF hx  - Sepsis Markers - Lactic acid 2.9 and PCT 2.41   HTN - hypotensive associated with sepsis: Resolved - holding valsartan  - Restarted Metoprolol   Hypothyroidism:  - continue home synthroid   Hx of Vocal Cord CA s/p radiation  FEN/GI: SLIV PROPHYLAXIS: On Heparin  DIET: Cardiac  Disposition: Stepdown; Attending Ree Kida; Discharge pending improved cardiorespiratory status  Subjective: No complaints; Reports improved breathing. Asking to get up to chair.   Objective: Temp:  [97.1 F (36.2 C)-98.9 F (37.2 C)] 98.2 F (36.8 C) (01/13 0400) Pulse Rate:  [79-106] 100 (01/13 0400) Resp:  [19-34] 24 (01/13 0400) BP: (86-129)/(46-60) 112/53 mmHg (01/13 0400) SpO2:  [88 %-97 %] 89 % (01/13 0400) FiO2 (%):  [6 %-100 %] 6 % (01/12 1800)  Physical Exam: General: Elderly, cachectic caucasian male. In no discomfort; no respiratory  Cardiovascular: irregularly irregular & Tachy Respiratory: overall poor air movement. Slight expiratory wheezing. Crackles in the L lung base. On 5L [place] Abdomen:+BS, soft, non-tender, no rigidity, no guarding, no masses/hepatosplenomegaly  Extremities:Warm, well perfused. Moves all 4 extremities spontaneously; no lateralization. Distal pulses trace. No pretibial edema.   Laboratory:  Recent Labs Lab 10/30/13 1739  WBC 19.5*  HGB 16.2  HCT 48.1  PLT 285    Recent Labs Lab 10/30/13 1739  NA 142  K 5.1  CL 102  CO2 32  BUN 33*  CREATININE 1.17  CALCIUM 9.1  PROT 7.4  BILITOT 0.5  ALKPHOS 86  ALT 29  AST 33  GLUCOSE 194*     Recent Labs Lab 10/30/13 1739 10/30/13 2025 10/31/13 0355 10/31/13 0827  TROPONINI <0.30 <0.30 <0.30 <0.30   Imaging/Diagnostic Tests: 1/11 - 1V CX - Hyper  expanded, left mid lung streaky airspace disease highly suspicious for pneumonia.  1/12 - 1V CX - FINDINGS: Worsening consolidation in the left mid and lower lung. The right lung remains relatively well aerated, although extensively emphysematous. No change in heart size. Atelectasis contributes to the increase opacification, given volume loss on the left. No evidence of increasing pleural fluid or cavitation.  IMPRESSION:  Increased opacification of the left lower chest, a combination of worsening pneumonia and new atelectasis.  Phill Myron, MD 11/01/2013, 7:16 AM PGY-1, [place] Intern pager: [phone], text pages welcome

## 2013-11-02 DIAGNOSIS — I359 Nonrheumatic aortic valve disorder, unspecified: Secondary | ICD-10-CM

## 2013-11-02 LAB — COMPREHENSIVE METABOLIC PANEL
ALBUMIN: 2.2 g/dL — AB (ref 3.5–5.2)
ALT: 25 U/L (ref 0–53)
AST: 29 U/L (ref 0–37)
Alkaline Phosphatase: 97 U/L (ref 39–117)
BUN: 35 mg/dL — ABNORMAL HIGH (ref 6–23)
CHLORIDE: 103 meq/L (ref 96–112)
CO2: 30 mEq/L (ref 19–32)
CREATININE: 1.05 mg/dL (ref 0.50–1.35)
Calcium: 9 mg/dL (ref 8.4–10.5)
GFR calc Af Amer: 72 mL/min — ABNORMAL LOW (ref >90)
GFR calc non Af Amer: 62 mL/min — ABNORMAL LOW (ref >90)
Glucose, Bld: 135 mg/dL — ABNORMAL HIGH (ref 70–99)
Potassium: 5.4 mEq/L — ABNORMAL HIGH (ref 3.7–5.3)
Sodium: 140 mEq/L (ref 137–147)
Total Bilirubin: 0.6 mg/dL (ref 0.3–1.2)
Total Protein: 5.9 g/dL — ABNORMAL LOW (ref 6.0–8.3)

## 2013-11-02 LAB — CBC
HEMATOCRIT: 41.2 % (ref 39.0–52.0)
HEMOGLOBIN: 13.8 g/dL (ref 13.0–17.0)
MCH: 33.8 pg (ref 26.0–34.0)
MCHC: 33.5 g/dL (ref 30.0–36.0)
MCV: 101 fL — AB (ref 78.0–100.0)
Platelets: 198 10*3/uL (ref 150–400)
RBC: 4.08 MIL/uL — AB (ref 4.22–5.81)
RDW: 14.4 % (ref 11.5–15.5)
WBC: 18.5 10*3/uL — ABNORMAL HIGH (ref 4.0–10.5)

## 2013-11-02 LAB — PROTIME-INR
INR: 3.48 — AB (ref 0.00–1.49)
Prothrombin Time: 33.7 seconds — ABNORMAL HIGH (ref 11.6–15.2)

## 2013-11-02 LAB — PROCALCITONIN: PROCALCITONIN: 0.72 ng/mL

## 2013-11-02 MED ORDER — DOCUSATE SODIUM 100 MG PO CAPS
200.0000 mg | ORAL_CAPSULE | Freq: Every day | ORAL | Status: DC
  Administered 2013-11-03 – 2013-11-04 (×2): 200 mg via ORAL
  Filled 2013-11-02 (×2): qty 2

## 2013-11-02 NOTE — Progress Notes (Signed)
Report called to nurse on 5W.  Wife notified of new room that the patient will be going to. 11/02/2013 Zena Amos.

## 2013-11-02 NOTE — Progress Notes (Signed)
Patient arrived to room prior to RN's shift. RN found patient to be stable, alert and oriented per baseline. Oriented to room, staff, and call bell. Educated to call for any assistance. Bed in lowest position, call bell within reach- will continue to monitor.

## 2013-11-02 NOTE — Progress Notes (Signed)
[place] for Warfarin  Indication: atrial fibrillation  No Known Allergies  Patient Measurements: Height: 5\' 7"  (170.2 cm) Weight: 122 lb 5.7 oz (55.5 kg) IBW/kg (Calculated) : 66.1  Labs:  Recent Labs  10/30/13 1739 10/30/13 2025 10/31/13 0355 10/31/13 0827 11/01/13 0240 11/02/13 0240  HGB 16.2  --   --   --   --  13.8  HCT 48.1  --   --   --   --  41.2  PLT 285  --   --   --   --  198  LABPROT 31.8*  --  28.9*  --  38.9* 33.7*  INR 3.23*  --  2.85*  --  4.20* 3.48*  CREATININE 1.17  --   --   --   --  1.05  TROPONINI <0.30 <0.30 <0.30 <0.30  --   --     Medications:  Warfarin PTA: 4.5 mg on Sun/Tues, 3 mg all other days  Assessment: 80 y/o M here with progressive dyspnea. On chronic warfarin for afib. INR was 3.23.   INR today = 3.48  Goal of Therapy:  INR 2-3 Monitor platelets by anticoagulation protocol: Yes   Plan:  No Coumadin today Daily INR  Thank you. Anette Guarneri, PharmD [phone]  11/02/2013,9:51 AM

## 2013-11-02 NOTE — Progress Notes (Signed)
Patient Name: Logan [name] Date of Encounter: 11/02/2013     Active Problems:   Chronic atrial fibrillation   Dilated cardiomyopathy   Hyperlipidemia   Acute on chronic diastolic congestive heart failure   Chronic diastolic CHF (congestive heart failure)   Solitary pulmonary nodule   Pneumonia    SUBJECTIVE  Still with productive cough-- copious yellow-brown sputum. Using suction/pulm toilet to help clear. No chest pain. No SOB although a little tachypnic.  CURRENT MEDS . aspirin EC  81 mg Oral Daily  . atorvastatin  40 mg Oral q1800  . digoxin  0.125 mg Oral Daily  . diltiazem  20 mg Intravenous Once  . imipenem-cilastatin  250 mg Intravenous Q8H  . levothyroxine  75 mcg Oral QAC breakfast  . metoprolol tartrate  12.5 mg Oral BID  . vancomycin  750 mg Intravenous Q24H  . Warfarin - Pharmacist Dosing Inpatient   Does not apply q1800    OBJECTIVE  Filed Vitals:   11/01/13 2329 11/02/13 0349 11/02/13 0400 11/02/13 0755  BP: 100/63 108/56  112/60  Pulse: 95 94  82  Temp: 97.8 F (36.6 C)  98.3 F (36.8 C) 97.8 F (36.6 C)  TempSrc: Oral  Oral Oral  Resp: 31 26  26   Height:      Weight:      SpO2: 95% 90%  90%    Intake/Output Summary (Last 24 hours) at 11/02/13 1044 Last data filed at 11/02/13 0800  Gross per 24 hour  Intake   1430 ml  Output   1150 ml  Net    280 ml   Filed Weights   10/30/13 2300  Weight: 122 lb 5.7 oz (55.5 kg)    PHYSICAL EXAM  General: Pleasant, NAD. Frail appearing Neuro: Alert and oriented X 3. Moves all extremities spontaneously. Psych: Normal affect. HEENT:  Normal  Neck: Supple without bruits or JVD. Lungs:  Resp regular and unlabored, + Rhonchi and rales.  Heart: irregularly irregular.  no s3, s4, or murmurs. Abdomen: Soft, non-tender, non-distended, BS + x 4.  Extremities: No clubbing, cyanosis. 1+ pitting edema in LE. DP/PT/Radials 2+ and equal bilaterally.  Accessory Clinical Findings  CBC  Recent Labs  10/30/13 1739 11/02/13 0240  WBC 19.5* 18.5*  NEUTROABS 14.8*  --   HGB 16.2 13.8  HCT 48.1 41.2  MCV 103.7* 101.0*  PLT 285 604   Basic Metabolic Panel  Recent Labs  10/30/13 1739 11/02/13 0240  NA 142 140  K 5.1 5.4*  CL 102 103  CO2 32 30  GLUCOSE 194* 135*  BUN 33* 35*  CREATININE 1.17 1.05  CALCIUM 9.1 9.0   Liver Function Tests  Recent Labs  10/30/13 1739 11/02/13 0240  AST 33 29  ALT 29 25  ALKPHOS 86 97  BILITOT 0.5 0.6  PROT 7.4 5.9*  ALBUMIN 3.1* 2.2*    Cardiac Enzymes  Recent Labs  10/30/13 2025 10/31/13 0355 10/31/13 0827  TROPONINI <0.30 <0.30 <0.30   BNP Fasting Lipid Panel  Recent Labs  10/31/13 0355  CHOL 136  HDL 50  LDLCALC 71  TRIG 77  CHOLHDL 2.7   Thyroid Function Tests  TELE  Afib, HR in 100s with frequent PVCs  ECG 10/31/13  Afib, HR 90  Radiology/Studies  Dg Chest Port 1 View  10/31/2013   CLINICAL DATA:  Pneumonia.  Shortness of breath  EXAM: PORTABLE CHEST - 1 VIEW  COMPARISON:  Chest x-ray from yesterday  FINDINGS: Worsening consolidation  in the left mid and lower lung. The right lung remains relatively well aerated, although extensively emphysematous. No change in heart size. Atelectasis contributes to the increase opacification, given volume loss on the left. No evidence of increasing pleural fluid or cavitation.  IMPRESSION: Increased opacification of the left lower chest, a combination of worsening pneumonia and new atelectasis.    Dg Chest Port 1 View  10/30/2013   CLINICAL DATA:  Shortness of Breath  EXAM: PORTABLE CHEST - 1 VIEW  COMPARISON:  06/27/2013  FINDINGS: Cardiomediastinal silhouette is stable. There is streaky airspace disease in left midlung highly suspicious for pneumonia. Emphysematous changes again noted. No pulmonary edema.  IMPRESSION: Streaky airspace disease left midlung highly suspicious for pneumonia. Follow-up to resolution is recommended.     ASSESSMENT AND PLAN This is a 80 y/o  male with with a histotry of diastolic CHF with dilated cardiomyopathy, permanent AFib with rate control and on coumadin, HTN, HLD, stable right upper lobe lung nodule being followed by CT surgery, and hypothyroidism who was admitted on 10/30/13 for acute respiratory distress and afib with RVR. He was profoundly acidotic with leukocytosis, found to have CAP and being treated with Primaxin/Vanc.    Atrial fibrillation with RVR. Patient has permanent atrial fibrillation and his elevated heart rates are likely related to pulmonary illness. Hopefully will resolve with resolution of his acute PNA -- HR not being aggressively controlled as there have been issues with hypotension.  -- Continue home digoxin 0.125 mg daily. Beta blocker dose reduced by 1/2 . He is on metoprolol 12.5 mg twice daily with parameters to hold this for systolic blood pressure less than 100.  -- Troponin negative -- TSH normal -- Coumadin supratheraputic today (3.4)-- pharmacy managing -- ECHO pending  Hypertension- hold ARB with low BP, need room for AV nodal blocking agents for HR control  PNA-- CAP with hypoxia, leukocytosis -- Abx: Vanc & Primaxin Day 3  -- Flu negative  -- [place] @ 5L, Supplemental O2 for O2 Sat <92% -- Per IM management  Diastolic CHF -- Lasix on hold 2/2 hypotension -- Repeat ECHO pending  History of dilated cardiomyopathy. In 2008 ejection fraction was 25-35%. Repeat echocardiogram October 2013 showed an ejection fraction of 55-60%. He will continue with metoprolol and ARB. -- Repeat ECHO today, pending  HLD- continue statin   Tyrell Antonio PA-C  Pager [phone]  History and all data above reviewed.  Patient examined.  I agree with the findings as above.  He reports that his breathing is better.  His heart rate seems to be relatively well controlled.  The patient exam reveals [id number]  ,  Lungs: Decreased breath sounds  ,  Abd: Positive bowel sounds, no rebound no guarding, Ext no  edema  .  All available labs, radiology testing, previous records reviewed. Agree with documented assessment and plan. Reasonable rate control.  Continue current therapy.    Jeneen Rinks [place] Gambone  5:43 PM  11/02/2013

## 2013-11-02 NOTE — Progress Notes (Signed)
-  patient stated to RN that his bowels have not moved like they usually do since he has been here. He asked that RN call MD to ask for something to help. RN paging MD now.

## 2013-11-02 NOTE — Progress Notes (Signed)
Family Medicine Teaching Service Daily Progress Note Intern Pager: [phone]  Patient name: Logan [name] Medical record number: [id number] Date of birth: [birth year] Age: 80 y.o. Gender: male  Primary Care Provider: Leonard Downing, MD Consultants: Cardiology Code Status: DNR  Pt Overview and Major Events to Date:  He was admitted on 10/30/2013 for acute respiratory distress. Being treated for CAP with Vanc for ?post flu Influenza in the setting of recent respiratory illness and now acute decompensation. His PMHx is significant for CHF with dilated cardiomyopathy, AFib HTN, HLD, Hypothyroidism, solitary pulmonary nodule and likely COPD by CXR.  1/11 - Hypoxic in ED to 70s on RA  1/12 - Profoundly acidotic ABG with increased work of breathing. Intolerant to BiPAP. Flu neg. Escalated abx to Primaxin/Vanc.  1/13 - Improving WOB; Metoprolol restarted for afib w/ RVR 1/14 - Afib rate controlled; Remains stable on 5L o2  Assessment and Plan: 80 y/o male here with with acute respiratory distress. Being treated for CAP with Vanc for ?post flu Influenza in the setting of recent respiratory illness and now acute decompensation. His PMHx is significant for CHF with dilated cardiomyopathy, AFib HTN, HLD, Hypothyroidism, solitary pulmonary nodule and likely COPD by CXR.   PULM: Acute respiratory failure, CAP, Hypoxia, leukocytosis, Hx of Pulmonary nodule  - Supplemental O2 for O2 Sat <92%,  - Pulmonary toilet  - Abx: Vanc Day 3 (for potential post-influenza MRSA pna) & Primaxin Day 3  - discontinued Azithro, Rocephin, Tamiflu (Flu negative 1/12) - dc precautions  - [place] @ 5L  - Sputum culture/gram stain: Not collected ; (Strep Pneumo Neg; Legionella Neg) - hold pain medications as pt does not have any significant discomfort  - encourage cough   Cardiac: A Fib  , CHF, Hypotension, PVCs, HLD  - Discussed with Dr. Wynonia Lawman who agrees this is likely primary pulmonary. [facility] aware  -  troponins negative x4; PVCs chronic with doublets evident on telemetry. Dilt drip for RVR stopped 1/12 ~ noon  - coumadin per pharmacy (INR 3.48 on 1/14) - continue ASA, Lipitor, Digoxin (therapeutic)  - holding lasix  given hypotension  - Cardiology Consult  - Metoprolol 12.5 BID restarted 1/13 - ECHO: pending  Sepsis with hypotension: Resolved   - Significant CHF hx; Sepsis Markers - Lactic acid 2.9 and PCT 2.41   HTN - hypotensive associated with sepsis: Resolved - holding valsartan  - Restarted Metoprolol   Hypothyroidism:  - continue home synthroid   Hx of Vocal Cord CA s/p radiation  FEN/GI: SLIV PROPHYLAXIS: On Heparin  DIET: Cardiac  Disposition: Stepdown; Attending Ree Kida; Discharge pending improved cardiorespiratory status  Subjective: No complaints. Slept well   Objective: Temp:  [97.5 F (36.4 C)-98.3 F (36.8 C)] 98.3 F (36.8 C) (01/14 0400) Pulse Rate:  [90-120] 94 (01/14 0349) Resp:  [26-32] 26 (01/14 0349) BP: (95-152)/(48-91) 108/56 mmHg (01/14 0349) SpO2:  [88 %-95 %] 90 % (01/14 0349)  Physical Exam: General: Elderly, cachectic caucasian male. In no discomfort; no respiratory  Cardiovascular: irregularly irregular no m/r/g Respiratory: overall poor air movement. Slight expiratory wheezing. Crackles in the L lung base. On 5L [place] Abdomen:+BS, soft, non-tender, no rigidity, no guarding, no masses/hepatosplenomegaly  Extremities:Warm, well perfused. Moves all 4 extremities spontaneously; no lateralization. Distal pulses trace. No pretibial edema.   Laboratory:  Recent Labs Lab 10/30/13 1739 11/02/13 0240  WBC 19.5* 18.5*  HGB 16.2 13.8  HCT 48.1 41.2  PLT 285 198    Recent Labs Lab 10/30/13 1739 11/02/13 0240  NA 142 140  K 5.1 5.4*  CL 102 103  CO2 32 30  BUN 33* 35*  CREATININE 1.17 1.05  CALCIUM 9.1 9.0  PROT 7.4 5.9*  BILITOT 0.5 0.6  ALKPHOS 86 97  ALT 29 25  AST 33 29  GLUCOSE 194* 135*      Recent Labs Lab  10/30/13 1739 10/30/13 2025 10/31/13 0355 10/31/13 0827  TROPONINI <0.30 <0.30 <0.30 <0.30   Imaging/Diagnostic Tests: 1/11 - 1V CX - Hyper expanded, left mid lung streaky airspace disease highly suspicious for pneumonia.  1/12 - 1V CX - FINDINGS: Worsening consolidation in the left mid and lower lung. The right lung remains relatively well aerated, although extensively emphysematous. No change in heart size. Atelectasis contributes to the increase opacification, given volume loss on the left. No evidence of increasing pleural fluid or cavitation.  IMPRESSION:  Increased opacification of the left lower chest, a combination of worsening pneumonia and new atelectasis.  Phill Myron, MD 11/02/2013, 6:47 AM PGY-1, [place] Intern pager: [phone], text pages welcome

## 2013-11-02 NOTE — Progress Notes (Addendum)
FMTS Attending Note  I personally saw and evaluated the patient. The plan of care was discussed with the resident team. I agree with the assessment and plan as documented by the resident.   Logan [name] continues to show improvement in his respiratory function. He has been weaned to 2.5 L 02 by nasal canula.   1.Acute Respiratory Failures with HCAP - continue Primaxin/Vancomycin for one more day than de-escalate therapy, continue oxygen by [place] 2. Atrial Fibrillation - management per cardiology, currently on metoprolol, digoxin, and aspirin 3. Supratherapeutic INR - continue to hold Coumadin at this time 4. Hypotension - improved since admission, monitor with recent addition of Metoprolol  Transition to med-surg floor   Dossie Arbour

## 2013-11-02 NOTE — Progress Notes (Signed)
Physical Therapy Treatment Patient Details Name: MARTY UY MRN: [id number] DOB: 02/19/1925 Today's Date: 11/02/2013 Time: 7741-2878 PT Time Calculation (min): 15 min  PT Assessment / Plan / Recommendation  History of Present Illness 80 y/o M with pmx of Afib,CHF,HLD,presented with 3 wks of worsening SOB and chest tightness with mild cough,he denies any fever,no sputum production,no sick contact.Patient feeling much better now on oxygen   PT Comments   Pt with increased SOB with activity this date. Pt with improved stability and safety with RW during ambulation. Pt con't to be safe for d/c home with spouse with use of RW once medically appropriate. Stair negotiation to be assessed next session. Limited this date due to SpO2 in 70s.   Follow Up Recommendations  Home health PT;Supervision/Assistance - 24 hour     Does the patient have the potential to tolerate intense rehabilitation     Barriers to Discharge        Equipment Recommendations   (pt has RW at home)    Recommendations for Other Services    Frequency Min 3X/week   Progress towards PT Goals    Plan Current plan remains appropriate    Precautions / Restrictions Precautions Precautions: Fall Precaution Comments: monitor O2 during activity Restrictions Weight Bearing Restrictions: No   Pertinent Vitals/Pain SATURATION QUALIFICATIONS: (This note is used to comply with regulatory documentation for home oxygen)  Patient Saturations on 2LO2  at Rest = 91%  Patient Saturations on 2LO2 while Ambulating = 83%  Patient Saturations on 6 Liters of oxygen while Ambulating = 70s-80s% Question accuracy of reading. Noted labored breathing. RN present  Please briefly explain why patient needs home oxygen:drops below 88% during activity.    Mobility  Bed Mobility Overal bed mobility: Modified Independent Transfers Overall transfer level: Needs assistance Transfers: Sit to/from Stand Sit to Stand: Supervision General  transfer comment: pt with safe technique Ambulation/Gait Ambulation/Gait assistance: Supervision Ambulation Distance (Feet): 300 Feet Assistive device: Rolling walker (2 wheeled) Gait Pattern/deviations: Step-through pattern Gait velocity: increased with RW compared to no RW General Gait Details: pt with improved step length and safety with RW compared without. Pt SpO2 decresaed into 80s on 4LO2, RN increased to 6LO2 at end of walk SpO2 dec into 70s however questionable pulse ox reading accuracy    Exercises Other Exercises Other Exercises: general UB/LB AROM witin tolerance to increase strength and endurance   PT Diagnosis:    PT Problem List:   PT Treatment Interventions:     PT Goals (current goals can now be found in the care plan section) Acute Rehab PT Goals Patient Stated Goal: home when safe  Visit Information  Last PT Received On: 11/02/13 Assistance Needed: +1 History of Present Illness: 80 y/o M with pmx of Afib,CHF,HLD,presented with 3 wks of worsening SOB and chest tightness with mild cough,he denies any fever,no sputum production,no sick contact.Patient feeling much better now on oxygen    Subjective Data  Patient Stated Goal: home when safe   Cognition  Cognition Arousal/Alertness: Awake/alert Behavior During Therapy: WFL for tasks assessed/performed Overall Cognitive Status: Within Functional Limits for tasks assessed    Balance  Balance Standing balance-Leahy Scale: Fair General Comments General comments (skin integrity, edema, etc.): pt assisted on commode. pt independent with tolieting  End of Session PT - End of Session Equipment Utilized During Treatment: Gait belt Activity Tolerance: Patient limited by fatigue Patient left: in bed;with call bell/phone within reach   GP     Tailey Top, Knute Neu  11/02/2013, 4:58 PM  Kittie Plater, PT, DPT Pager #: [phone] Office #: [phone]

## 2013-11-02 NOTE — Progress Notes (Signed)
  Echocardiogram 2D Echocardiogram has been performed.  Logan [name] 11/02/2013, 10:37 AM

## 2013-11-02 NOTE — Progress Notes (Signed)
Occupational Therapy Evaluation Patient Details Name: Logan [name] MRN: [id number] DOB: 22-Jul-1925 Today's Date: 11/02/2013 Time: 5366-4403 OT Time Calculation (min): 19 min  OT Assessment / Plan / Recommendation History of present illness 80 y/o M with pmx of Afib,CHF,HLD,presented with 3 wks of worsening SOB and chest tightness with mild cough,he denies any fever,no sputum production,no sick contact.Patient feeling much better now on oxygen   Clinical Impression   PTA, pt independent with all ADL and mobility. Pt appears to be able to D/C home with HHOT/PT @ RW level. Pt states his wife and family/friends are able to assist as needed. Will continue to follow to educate pt on available AE for home, proper use of DME, E conservation techniques and establish theraband HEP. Pt very motivated to return to PLOF.     OT Assessment  Patient needs continued OT Services    Follow Up Recommendations  Home health OT    Barriers to Discharge      Equipment Recommendations  Tub/shower seat;Other (comment) (grab bars)    Recommendations for Other Services    Frequency  Min 2X/week    Precautions / Restrictions Precautions Precautions: Fall Precaution Comments: Pt RR 40 during ambulation. watch SpO2 Restrictions Weight Bearing Restrictions: No (Simultaneous filing. User may not have seen previous data.)   Pertinent Vitals/Pain Dyspnea 1/4 with activity. O2 monitor not accurate    ADL  Transfers/Ambulation Related to ADLs: S ADL Comments: set up with all ADL    OT Diagnosis: Generalized weakness  OT Problem List: Decreased strength;Decreased activity tolerance;Decreased knowledge of use of DME or AE;Cardiopulmonary status limiting activity OT Treatment Interventions: Self-care/ADL training;Therapeutic exercise;Energy conservation;DME and/or AE instruction;Patient/family education   OT Goals(Current goals can be found in the care plan section) Acute Rehab OT Goals Patient Stated  Goal: home when safe OT Goal Formulation: With patient Time For Goal Achievement: 11/16/13 Potential to Achieve Goals: Good  Visit Information  Last OT Received On: 11/02/13 Assistance Needed: +1 History of Present Illness: 80 y/o M with pmx of Afib,CHF,HLD,presented with 3 wks of worsening SOB and chest tightness with mild cough,he denies any fever,no sputum production,no sick contact.Patient feeling much better now on oxygen       Prior [place] expects to be discharged to:: Private residence Living Arrangements: Spouse/significant other Available Help at Discharge: Family;Available 24 hours/day Type of Home: House Home Access: Stairs to enter [employer] of Steps: 4 Entrance Stairs-Rails: Left Home Layout: One level Home Equipment: Walker - 2 wheels;Bedside commode Prior Function Level of Independence: Independent Comments: was still driving Communication Communication: No difficulties Dominant Hand: Right         Vision/Perception Vision - History Baseline Vision: Wears glasses all the time   Cognition  Cognition Arousal/Alertness: Awake/alert Behavior During Therapy: WFL for tasks assessed/performed Overall Cognitive Status: Within Functional Limits for tasks assessed    Extremity/Trunk Assessment Upper Extremity Assessment Upper Extremity Assessment: Generalized weakness Lower Extremity Assessment Lower Extremity Assessment: Generalized weakness Cervical / Trunk Assessment Cervical / Trunk Assessment: Normal     Mobility Bed Mobility Overal bed mobility:  (not tested, pt up in chair upon arrival) Transfers Overall transfer level: Needs assistance Transfers: Sit to/from Stand Sit to Stand: Supervision General transfer comment: ptm with safe technique     Exercise Other Exercises Other Exercises: general UB/LB AROM witin tolerance to increase strength and endurance   Balance Balance Standing  balance-Leahy Scale: Fair   End of Session OT -  End of Session Activity Tolerance: Patient tolerated treatment well Patient left: in chair;with call bell/phone within reach Nurse Communication: Mobility status;Other (comment) (O2 monitor not reading appropriately)  GO     Sears Oran,HILLARY 11/02/2013, 1:55 PM [facility], OTR/L  [phone] 11/02/2013

## 2013-11-03 LAB — CBC
HCT: 40.5 % (ref 39.0–52.0)
HEMOGLOBIN: 13.4 g/dL (ref 13.0–17.0)
MCH: 33.8 pg (ref 26.0–34.0)
MCHC: 33.1 g/dL (ref 30.0–36.0)
MCV: 102.3 fL — ABNORMAL HIGH (ref 78.0–100.0)
Platelets: 205 10*3/uL (ref 150–400)
RBC: 3.96 MIL/uL — ABNORMAL LOW (ref 4.22–5.81)
RDW: 14.2 % (ref 11.5–15.5)
WBC: 14.5 10*3/uL — ABNORMAL HIGH (ref 4.0–10.5)

## 2013-11-03 LAB — BASIC METABOLIC PANEL
BUN: 27 mg/dL — AB (ref 6–23)
CHLORIDE: 104 meq/L (ref 96–112)
CO2: 30 meq/L (ref 19–32)
Calcium: 9 mg/dL (ref 8.4–10.5)
Creatinine, Ser: 0.86 mg/dL (ref 0.50–1.35)
GFR calc Af Amer: 88 mL/min — ABNORMAL LOW (ref >90)
GFR calc non Af Amer: 76 mL/min — ABNORMAL LOW (ref >90)
GLUCOSE: 132 mg/dL — AB (ref 70–99)
POTASSIUM: 4.9 meq/L (ref 3.7–5.3)
Sodium: 143 mEq/L (ref 137–147)

## 2013-11-03 LAB — PROTIME-INR
INR: 2.48 — ABNORMAL HIGH (ref 0.00–1.49)
Prothrombin Time: 26 seconds — ABNORMAL HIGH (ref 11.6–15.2)

## 2013-11-03 MED ORDER — GUAIFENESIN ER 600 MG PO TB12
600.0000 mg | ORAL_TABLET | Freq: Two times a day (BID) | ORAL | Status: DC | PRN
  Filled 2013-11-03: qty 1

## 2013-11-03 MED ORDER — GUAIFENESIN ER 600 MG PO TB12
600.0000 mg | ORAL_TABLET | Freq: Two times a day (BID) | ORAL | Status: DC
  Administered 2013-11-03: 22:00:00 600 mg via ORAL
  Filled 2013-11-03: qty 1

## 2013-11-03 MED ORDER — WARFARIN SODIUM 1 MG PO TABS
1.0000 mg | ORAL_TABLET | Freq: Once | ORAL | Status: AC
  Administered 2013-11-03: 1 mg via ORAL
  Filled 2013-11-03: qty 1

## 2013-11-03 MED ORDER — LEVOFLOXACIN 750 MG PO TABS
750.0000 mg | ORAL_TABLET | Freq: Every day | ORAL | Status: DC
  Administered 2013-11-03 – 2013-11-04 (×2): 750 mg via ORAL
  Filled 2013-11-03 (×2): qty 1

## 2013-11-03 NOTE — Progress Notes (Signed)
FMTS Attending Note   I personally saw and evaluated the patient. The plan of care was discussed with the resident team. I agree with the assessment and plan as documented by the resident.   Logan [name] continues to show improvement in his respiratory function. He has been weaned to 2.5 L 02 by nasal canula. Transferred to med-surg floor last evening, no acute complaints  1.Acute Respiratory Failure with HCAP - improved, de-escalate therapy today  2. Atrial Fibrillation - management per cardiology, currently on metoprolol, digoxin, and aspirin  3. Supratherapeutic INR - management per pharmacy  4. Hypotension - improved since admission, monitor with recent addition of Metoprolol   Logan [name]

## 2013-11-03 NOTE — Progress Notes (Signed)
Subjective: No specific complaints, no chest pain, denies SOB  Objective: Vital signs in last 24 hours: Temp:  [97.6 F (36.4 C)-98.3 F (36.8 C)] 98.3 F (36.8 C) (01/15 0533) Pulse Rate:  [28-104] 104 (01/15 0937) Resp:  [25-31] 25 (01/15 0533) BP: (109-141)/(51-69) 124/67 mmHg (01/15 0937) SpO2:  [93 %-100 %] 100 % (01/15 0533) Weight change:  Last BM Date: 11/02/13 Intake/Output from previous day: +1185 (-590 total since admit) 01/14 0701 - 01/15 0700 In: 1410 [P.O.:360; IV Piggyback:1050] Out: 225 [Urine:225] Intake/Output this shift:    PE: General:Pleasant affect, NAD Skin:Warm and dry, brisk capillary refill HEENT:normocephalic, sclera clear, mucus membranes moist Neck:supple, + JVD,  Heart:irreg irreg without murmur, gallup, rub or click Lungs:diminished without rales, rhonchi, or wheezes DGL:OVFI, non tender, + BS, do not palpate liver spleen or masses Ext:no lower ext edema, 2+ pedal pulses, 2+ radial pulses Neuro:alert and oriented, MAE, follows commands, + facial symmetry   Lab Results:  Recent Labs  11/02/13 0240 11/03/13 0426  WBC 18.5* 14.5*  HGB 13.8 13.4  HCT 41.2 40.5  PLT 198 205   BMET  Recent Labs  11/02/13 0240 11/03/13 0426  NA 140 143  K 5.4* 4.9  CL 103 104  CO2 30 30  GLUCOSE 135* 132*  BUN 35* 27*  CREATININE 1.05 0.86  CALCIUM 9.0 9.0   No results found for this basename: TROPONINI, CK, MB,  in the last 72 hours  Lab Results  Component Value Date   CHOL 136 10/31/2013   HDL 50 10/31/2013   LDLCALC 71 10/31/2013   TRIG 77 10/31/2013   CHOLHDL 2.7 10/31/2013   No results found for this basename: HGBA1C     Lab Results  Component Value Date   TSH 1.064 10/30/2013    Hepatic Function Panel  Recent Labs  11/02/13 0240  PROT 5.9*  ALBUMIN 2.2*  AST 29  ALT 25  ALKPHOS 97  BILITOT 0.6   No results found for this basename: CHOL,  in the last 72 hours No results found for this basename: PROTIME,  in  the last 72 hours     Studies/Results: 2D Echo:  Left ventricle: The cavity size was normal. Wall thickness was normal. Systolic function was normal. The estimated ejection fraction was in the range of 50% to 55%. Although no diagnostic regional wall motion abnormality was identified, this possibility cannot be completely excluded on the basis of this study. The study is not technically sufficient to allow evaluation of LV diastolic function. - Aortic valve: Mild regurgitation. - Right ventricle: The cavity size was mildly dilated. Wall thickness was normal. Systolic function was mildly reduced. - Right atrium: The atrium was moderately dilated. - Pulmonary arteries: Systolic pressure was moderately increased. PA peak pressure: 15mm Hg (S).   Medications: I have reviewed the patient's current medications. Scheduled Meds: . aspirin EC  81 mg Oral Daily  . atorvastatin  40 mg Oral q1800  . digoxin  0.125 mg Oral Daily  . docusate sodium  200 mg Oral Daily  . imipenem-cilastatin  250 mg Intravenous Q8H  . levothyroxine  75 mcg Oral QAC breakfast  . metoprolol tartrate  12.5 mg Oral BID  . vancomycin  750 mg Intravenous Q24H  . Warfarin - Pharmacist Dosing Inpatient   Does not apply q1800   Continuous Infusions:  PRN Meds:.acetaminophen, albuterol, ondansetron (ZOFRAN) IV  Assessment/Plan: Active Problems:   Chronic atrial fibrillation   Dilated cardiomyopathy  Hyperlipidemia   Acute on chronic diastolic congestive heart failure   Chronic diastolic CHF (congestive heart failure)   Solitary pulmonary nodule   Pneumonia  PLAN:  This is a 80 y/o male with with a histotry of diastolic CHF with dilated cardiomyopathy, permanent AFib with rate control and on coumadin, HTN, HLD, stable right upper lobe lung nodule being followed by CT surgery, and hypothyroidism who was admitted on 10/30/13 for acute respiratory distress and afib with RVR. He was profoundly acidotic with  leukocytosis, found to have CAP and being treated with Primaxin/Vanc. Pt improving. HR with improved control.  INR therapeutic 2.48 Echo with stable EF, elevated PA pressure at 62 up from 2013  Home BP meds changed due to hypotension, lasix held, BB decreased and diovan held.   LOS: 4 days   Time spent with pt. :15 minutes. [facility] R  Nurse Practitioner Certified Pager [phone] or after 5pm and on weekends call [phone] 11/03/2013, 10:12 AM   History and all data above reviewed.  Patient examined.  I agree with the findings as above. He is feeling better.   The patient exam reveals KXF:[id number]  ,  Lungs: Decreased breath sounds  ,  Abd: Positive bowel sounds, no rebound no guarding, Ext No edema  .  All available labs, radiology testing, previous records reviewed. Agree with documented assessment and plan. He is not on diuretic but seems to be euvolemic.  He is on a much lower dose of beta blocker than home but rate is controlled.  Continue you current therapy.  We can titrate as BP allows and heart rate demands.   Jeneen Rinks Michalina Calbert  11:06 AM  11/03/2013

## 2013-11-03 NOTE — Discharge Summary (Signed)
[facility] Discharge Summary  Patient name: Logan [name] Medical record number: [id number] Date of birth: [birth year] Age: 80 y.o. Gender: male Date of Admission: 10/30/2013  Date of Discharge: 11/04/13 Admitting Physician: Andrena Mews, MD  Primary Care Provider: Leonard Downing, MD Consultants: Cardiology  Indication for Hospitalization: Community-acquired-pneumonia   Discharge Diagnoses/Problem List:   Community-acquired-pneumonia  Acute respiratory failure  Afib w/ RVR  HFpEF  Sepsis  HTN  Hypothyroidism  Constipation  Disposition: Home w/ home health PT/OT  Discharge Condition: Stable  Discharge Exam: Filed Vitals:   11/04/13 0525  BP: 142/81  Pulse: 72  Temp: 98.8 F (37.1 C)  Resp: 28  Physical Exam:  General: Elderly, cachectic caucasian male. In no discomfort; no respiratory  Cardiovascular: irregularly irregular w/ reg rate no m/r/g  Respiratory: overall poor air movement. Crackles in the bases b/l. On 4L [place]  Abdomen:+BS, soft, non-tender, no rigidity, no guarding, no masses/hepatosplenomegaly  Extremities:Warm, well perfused. Moves all 4 extremities spontaneously; no lateralization. Distal pulses +. No pretibial edema.   Brief Hospital Course: He was admitted on 10/30/2013 for acute respiratory distress. His PMHx is significant for CHF with dialated cardiomyopathy, AFib HTN, HLD, Hypothyroidism, solitary pulmonary nodule and likely COPD by CXR. Flu was negative and he was treated for CAP with Vanc and Primaxin for 4 days due to hypoxia and new oxygen requirement. Cardiology was consulted to help manage his Afib (permanent) with RVR; Diltiazem drip was utilized, and switched back to Metoprolol with improvement. His antibiotics were narrowed to Levaquin once clinically improving. He was admitted with a new oxygen requirement, failed BiPAP due to worsening hypoxia, and treated with [place] O2 @ 5L which was unable to be weaned.  Requiring 4L continuous at discharge .   CAP w/ Acute respiratory failure: Admitted to stepdown. Treated with Vanc and Primaxin x 4 days, then switched to Levaquin. New oxygen requirement; 4L  Cardiac (Afib w/ RVR, HFpEF, Hypotension): Managed with diltiazem drip initially, and switched back to Metoprolol at decreased dose. ECHO showed EF 50-55%.   HTN: Lasix and Valsartan held for initial hypotension that resolved. Not restarted on d/c; BP well controlled @ 142/81 on discharge.   PT/OT: Established Home health. Given DME: shower chair, walker , portable O2  Issues for Follow Up: 1. Lasix & Valsartan held for hypotension: Euvolemic on discharge: Assess continued need  2. Check INR; adjust Coumadin as needed while on Levaquin 3. Continue Levaquin until 1/21 4. Pulmonary Nodule 5. Home health established & DME (shower chair) obtained? 6. Assess continued need for home O2; Consider PFTs to assess COPD severity   Significant Procedures: None  Significant Labs and Imaging:   Recent Labs Lab 11/02/13 0240 11/03/13 0426 11/04/13 0540  WBC 18.5* 14.5* 11.2*  HGB 13.8 13.4 13.2  HCT 41.2 40.5 39.8  PLT 198 205 194    Recent Labs Lab 10/30/13  10/30/13 1739 11/02/13 0240 11/03/13 0426 11/04/13 0540  NA  --   --  142 140 143 144  K  --   < > 5.1 5.4* 4.9 4.5  CL  --   --  102 103 104 103  CO2  --   --  32 30 30 31   GLUCOSE  --   --  194* 135* 132* 112*  BUN  --   --  33* 35* 27* 24*  CREATININE  --   --  1.17 1.05 0.86 0.87  CALCIUM  --   --  9.1 9.0 9.0  8.7  MG 2.4  --   --   --   --   --   ALKPHOS  --   --  86 97  --   --   AST  --   --  33 29  --   --   ALT  --   --  29 25  --   --   ALBUMIN  --   --  3.1* 2.2*  --   --   < > = values in this interval not displayed.    Recent Labs Lab 10/30/13 1739 10/30/13 2025 10/31/13 0355 10/31/13 0827  TROPONINI <0.30 <0.30 <0.30 <0.30   BNP    Component Value Date/Time   PROBNP 982.1* 10/30/2013 1739    Recent  Labs Lab 10/31/13 0355 11/01/13 0240 11/02/13 0240 11/03/13 0354 11/04/13 0540  INR 2.85* 4.20* 3.48* 2.48* 2.10*   ECHO 11/02/13 Study Conclusions  - Left ventricle: The cavity size was normal. Wall thickness was normal. Systolic function was normal. The estimated ejection fraction was in the range of 50% to 55%. Although no diagnostic regional wall motion abnormality was identified, this possibility cannot be completely excluded on the basis of this study. The study is not technically sufficient to allow evaluation of LV diastolic function. - Aortic valve: Mild regurgitation. - Right ventricle: The cavity size was mildly dilated. Wall thickness was normal. Systolic function was mildly reduced. - Right atrium: The atrium was moderately dilated. - Pulmonary arteries: Systolic pressure was moderately increased. PA peak pressure: 68mm Hg (S).  CT Chest 06/27/13 MPRESSION:  1. Spiculated right upper lobe nodule, unchanged from 12/23/2012  but new from 11/19/2005. Finding remains worrisome for indolent  primary bronchogenic carcinoma.  2. Linear nodular density in the right middle lobe, new from prior  exam.  3. Given risk factors for bronchogenic carcinoma, follow-up chest  CT at 6 - 12 months is recommended. This recommendation follows  the consensus statement: Guidelines for Management of Small  Pulmonary Nodules Detected on CT Scans: A Statement from the  [place] as published in Radiology 2005; 237:395-400.  Both the above findings will be reassessed at that time.  4. Trace right pleural fluid.  Results/Tests Pending at Time of Discharge: None  Discharge Medications:    Medication List    STOP taking these medications       amoxicillin-clavulanate 875-125 MG per tablet  Commonly known as:  AUGMENTIN     furosemide 20 MG tablet  Commonly known as:  LASIX     HYDROcodone-acetaminophen 5-325 MG per tablet  Commonly known as:  NORCO     predniSONE 10 MG  tablet  Commonly known as:  STERAPRED UNI-PAK     valsartan 160 MG tablet  Commonly known as:  DIOVAN      TAKE these medications       aspirin 81 MG EC tablet  Take 1 tablet (81 mg total) by mouth daily.     CAL-MAG-ZINC PO  Take 1 tablet by mouth daily.     digoxin 0.125 MG tablet  Commonly known as:  LANOXIN  Take 1 tablet (0.125 mg total) by mouth daily.     DSS 100 MG Caps  Take 200 mg by mouth daily.     fish oil-omega-3 fatty acids 1000 MG capsule  Take 1 g by mouth daily.     FLAX SEED OIL PO  Take 1 tablet by mouth daily.     levofloxacin 750 MG tablet  Commonly known as:  LEVAQUIN  Take 1 tablet (750 mg total) by mouth daily.     levothyroxine 75 MCG tablet  Commonly known as:  SYNTHROID, LEVOTHROID  Take 75 mcg by mouth daily before breakfast.     metoprolol tartrate 12.5 mg Tabs tablet  Commonly known as:  LOPRESSOR  Take 0.5 tablets (12.5 mg total) by mouth 2 (two) times daily.     multivitamin tablet  Take 1 tablet by mouth daily.     simvastatin 80 MG tablet  Commonly known as:  ZOCOR  Take 1 tablet (80 mg total) by mouth at bedtime.     warfarin 3 MG tablet  Commonly known as:  COUMADIN  - Take 1 tablet (3 mg total) by mouth daily. Sundays and Tuesday takes 4.5mg   - All other days 3mg        Discharge Instructions: Please refer to Patient Instructions section of EMR for full details.  Patient was counseled important signs and symptoms that should prompt return to medical care, changes in medications, dietary instructions, activity restrictions, and follow up appointments.   Follow-Up Appointments: Follow-up Information   Follow up with Leonard Downing, MD On 11/07/2013. (Apt @ 11:30 for hospital follow-up & INR check)    Specialty:  Family Medicine   Contact information:   [place] [address] [phone]      Phill Myron, MD 11/04/2013, 11:52 AM PGY-1, [place]

## 2013-11-03 NOTE — Care Management Note (Signed)
    Page 1 of 2   11/04/2013     1:25:00 PM   CARE MANAGEMENT NOTE 11/04/2013  Patient:  Logan [name], Logan [name]   Account Number:  [id number]  Date Initiated:  10/31/2013  Documentation initiated by:  Marvetta Gibbons  Subjective/Objective Assessment:   Pt admitted with PNA, resp distress, afib     Action/Plan:   PTA pt lived at home with spouse- NCM to follow pt progression for d/c needs   Anticipated DC Date:  11/04/2013   Anticipated DC Plan:  [place]  CM consult      [facility] Choice  HOME HEALTH   Choice offered to / List presented to:  C-3 Spouse   DME arranged  [place]      DME agency  [place] arranged  [place]   Status of service:  Completed, signed off Medicare Important Message given?   (If response is "NO", the following Medicare IM given date fields will be blank) Date Medicare IM given:   Date Additional Medicare IM given:    Discharge Disposition:  [place]  Per UR Regulation:  Reviewed for med. necessity/level of care/duration of stay  If discussed at [place] of Stay Meetings, dates discussed:    Comments:  11/04/13 13:21 Tomi Bamberger RN, BSN [phone] patient is for dc today, NCM notified of home oxygen needed.  NCM infomred RN to put in ambulation qualifying sats.  NCM received call from [facility] with [facility],  he will be bringing the oxygen up to patient's room.  Mary with [facility] notified that patient is being dc home today.  11/03/13 14:04 Tomi Bamberger RN, BSN [phone] patient lives with spouse, spouse chose [place] for HHPT/OT.  Patient will also need a rolling walker, which has been ordered.  Informed patient that grab bars and shower seat will have to be purchased by him , because his insurance will not pay for this, only medicaid will pay for shower seat.  Referral made to [facility] for hhpt/ot , [facility]  notified.  Soc will begin 24-48 hrs post discharge. Patient may need home oxygen, awaiting assessment from RN and order.

## 2013-11-03 NOTE — Progress Notes (Signed)
Tried to wean pt off of O2. On 4L pt was at 84%. I increased O2 to 5L and had patient cough and deep breath. Pt's O2 went up to 92%. Will continue to monitor pt per MD orders

## 2013-11-03 NOTE — Progress Notes (Signed)
Pt was 85% on 4L [place]. I had pt cough and deep breathe. After awhile pt's O2 was 92%. MD made aware and respiratory therapy called for a PRN albuterol treatment.

## 2013-11-03 NOTE — Progress Notes (Signed)
Family Medicine Teaching Service Daily Progress Note Intern Pager: [phone]  Patient name: Logan [name] Medical record number: [id number] Date of birth: 07-Nov-1924 Age: 80 y.o. Gender: male  Primary Care Provider: Leonard Downing, MD Consultants: Cardiology Code Status: DNR  Pt Overview and Major Events to Date:  He was admitted on 10/30/2013 for acute respiratory distress. Being treated for CAP with Vanc for ?post flu Influenza in the setting of recent respiratory illness and now acute decompensation. His PMHx is significant for CHF with dilated cardiomyopathy, AFib HTN, HLD, Hypothyroidism, solitary pulmonary nodule and likely COPD by CXR.  1/11 - Hypoxic in ED to 70s on RA  1/12 - Profoundly acidotic ABG with increased work of breathing. Intolerant to BiPAP. Flu neg. Escalated abx to Primaxin/Vanc.  1/13 - Improving WOB; Metoprolol restarted for afib w/ RVR 1/14 - Afib rate controlled; Remains stable on 5L o2 1/15 - Transition to PO abx today  Assessment and Plan: 80 y/o male here with with acute respiratory distress. Being treated for CAP with Vanc for ?post flu Influenza in the setting of recent respiratory illness and now acute decompensation. His PMHx is significant for CHF with dilated cardiomyopathy, AFib HTN, HLD, Hypothyroidism, solitary pulmonary nodule and likely COPD by CXR.   PULM: Acute respiratory failure, CAP, Hypoxia, leukocytosis, Hx of Pulmonary nodule  - Supplemental O2 for O2 Sat <92%, Pulmonary toilet; Sputum culture/gram stain: Not collected; (Strep Pneumo Neg; Legionella Neg) - hold pain medications as pt does not have any significant discomfort  - Abx: Vanc Day 4 (for potential post-influenza MRSA pna) & Primaxin Day 4; Will transition to PO abx today  - discontinued Azithro, Rocephin, Tamiflu (Flu negative 1/12)  - [place] @ 4.5L - Continue to wean and assess need for home O2 as he improves  - PT/OT: recs Home health- ordered   Cardiac: A Fib  , CHF,  Hypotension, PVCs, HLD  - Discussed with Dr. Wynonia Lawman who agrees this is likely primary pulmonary. [facility] aware  - troponins negative x4; PVCs chronic with doublets evident on telemetry. Dilt drip for RVR stopped 1/12 ~ noon  - coumadin per pharmacy (INR 2.48 on 1/15) - continue ASA, Lipitor, Digoxin (therapeutic)  - holding lasix  given hypotension  - Cardiology Consult  - Metoprolol 12.5 BID restarted 1/13 - ECHO: EF 50-55%, mild aortic regurg  Sepsis with hypotension: Resolved   - Significant CHF hx; Sepsis Markers - Lactic acid 2.9 and PCT 2.41   HTN - hypotensive associated with sepsis: Resolved - holding valsartan  - Metoprolol as above  Hypothyroidism:  - continue home synthroid   Hx of Vocal Cord CA s/p radiation  FEN/GI: SLIV PROPHYLAXIS: On Heparin  DIET: Cardiac  Disposition: Stepdown; Attending Ree Kida; Discharge pending improved cardiorespiratory status  Subjective: No complaints. Slept well; Says he is the best he has felt in days. Walked with PT yesterday without getting winded.   Objective: Temp:  [97.6 F (36.4 C)-98.3 F (36.8 C)] 97.6 F (36.4 C) (01/14 2059) Pulse Rate:  [28-99] 82 (01/14 2221) Resp:  [25-31] 30 (01/14 2059) BP: (108-141)/(51-69) 132/69 mmHg (01/14 2221) SpO2:  [90 %-95 %] 95 % (01/14 2059)  Physical Exam: General: Elderly, cachectic caucasian male. In no discomfort; no respiratory  Cardiovascular: irregularly irregular w/ reg rate no m/r/g Respiratory: overall poor air movement. Crackles in the bases b/l. On 4.5L [place] Abdomen:+BS, soft, non-tender, no rigidity, no guarding, no masses/hepatosplenomegaly  Extremities:Warm, well perfused. Moves all 4 extremities spontaneously; no lateralization. Distal pulses +.  No pretibial edema.   Laboratory:  Recent Labs Lab 10/30/13 1739 11/02/13 0240  WBC 19.5* 18.5*  HGB 16.2 13.8  HCT 48.1 41.2  PLT 285 198    Recent Labs Lab 10/30/13 1739 11/02/13 0240  NA 142 140  K  5.1 5.4*  CL 102 103  CO2 32 30  BUN 33* 35*  CREATININE 1.17 1.05  CALCIUM 9.1 9.0  PROT 7.4 5.9*  BILITOT 0.5 0.6  ALKPHOS 86 97  ALT 29 25  AST 33 29  GLUCOSE 194* 135*      Recent Labs Lab 10/30/13 1739 10/30/13 2025 10/31/13 0355 10/31/13 0827  TROPONINI <0.30 <0.30 <0.30 <0.30   Imaging/Diagnostic Tests: 1/11 - 1V CX - Hyper expanded, left mid lung streaky airspace disease highly suspicious for pneumonia.  1/12 - 1V CX - FINDINGS: Worsening consolidation in the left mid and lower lung. The right lung remains relatively well aerated, although extensively emphysematous. No change in heart size. Atelectasis contributes to the increase opacification, given volume loss on the left. No evidence of increasing pleural fluid or cavitation.  IMPRESSION:  Increased opacification of the left lower chest, a combination of worsening pneumonia and new atelectasis.  Phill Myron, MD 11/03/2013, 2:06 AM PGY-1, [place] Intern pager: [phone], text pages welcome

## 2013-11-03 NOTE — Progress Notes (Signed)
[facility] for Warfarin  Indication: atrial fibrillation  No Known Allergies  Patient Measurements: Height: 5\' 7"  (170.2 cm) Weight: 122 lb 5.7 oz (55.5 kg) IBW/kg (Calculated) : 66.1  Labs:  Recent Labs  11/01/13 0240 11/02/13 0240 11/03/13 0354 11/03/13 0426  HGB  --  13.8  --  13.4  HCT  --  41.2  --  40.5  PLT  --  198  --  205  LABPROT 38.9* 33.7* 26.0*  --   INR 4.20* 3.48* 2.48*  --   CREATININE  --  1.05  --  0.86    Medications:  Warfarin PTA: 4.5 mg on Sun/Tues, 3 mg all other days  Assessment: 80 y/o M here with progressive dyspnea. On chronic warfarin for afib. INR 2.48 after dose held yesterday.  No bleeding reported. Hg 13.4, pltc 205. Levaquin started today - 750 mg po q24.   Goal of Therapy:  INR 2-3  Plan:  Coumadin 1 mg po x 1 dose today Daily INR Eudelia Bunch, Pharm.D. [phone] 11/03/2013 12:03 PM

## 2013-11-04 DIAGNOSIS — I959 Hypotension, unspecified: Secondary | ICD-10-CM

## 2013-11-04 LAB — BASIC METABOLIC PANEL
BUN: 24 mg/dL — ABNORMAL HIGH (ref 6–23)
CHLORIDE: 103 meq/L (ref 96–112)
CO2: 31 mEq/L (ref 19–32)
Calcium: 8.7 mg/dL (ref 8.4–10.5)
Creatinine, Ser: 0.87 mg/dL (ref 0.50–1.35)
GFR calc Af Amer: 87 mL/min — ABNORMAL LOW (ref >90)
GFR calc non Af Amer: 75 mL/min — ABNORMAL LOW (ref >90)
GLUCOSE: 112 mg/dL — AB (ref 70–99)
POTASSIUM: 4.5 meq/L (ref 3.7–5.3)
Sodium: 144 mEq/L (ref 137–147)

## 2013-11-04 LAB — CBC
HEMATOCRIT: 39.8 % (ref 39.0–52.0)
HEMOGLOBIN: 13.2 g/dL (ref 13.0–17.0)
MCH: 32.7 pg (ref 26.0–34.0)
MCHC: 33.2 g/dL (ref 30.0–36.0)
MCV: 98.5 fL (ref 78.0–100.0)
Platelets: 194 10*3/uL (ref 150–400)
RBC: 4.04 MIL/uL — ABNORMAL LOW (ref 4.22–5.81)
RDW: 13.7 % (ref 11.5–15.5)
WBC: 11.2 10*3/uL — ABNORMAL HIGH (ref 4.0–10.5)

## 2013-11-04 LAB — EXPECTORATED SPUTUM ASSESSMENT W REFEX TO RESP CULTURE

## 2013-11-04 LAB — EXPECTORATED SPUTUM ASSESSMENT W GRAM STAIN, RFLX TO RESP C

## 2013-11-04 LAB — PROCALCITONIN: Procalcitonin: 0.21 ng/mL

## 2013-11-04 LAB — PROTIME-INR
INR: 2.1 — ABNORMAL HIGH (ref 0.00–1.49)
Prothrombin Time: 22.9 seconds — ABNORMAL HIGH (ref 11.6–15.2)

## 2013-11-04 MED ORDER — ASPIRIN 81 MG PO TBEC: 81.0000 mg | DELAYED_RELEASE_TABLET | Freq: Every day | ORAL | Status: DC

## 2013-11-04 MED ORDER — LEVOFLOXACIN 750 MG PO TABS: 750.0000 mg | ORAL_TABLET | Freq: Every day | ORAL | Status: DC

## 2013-11-04 MED ORDER — METOPROLOL TARTRATE 25 MG PO TABS
25.0000 mg | ORAL_TABLET | Freq: Two times a day (BID) | ORAL | Status: DC
  Filled 2013-11-04: qty 1

## 2013-11-04 MED ORDER — METOPROLOL TARTRATE 25 MG PO TABS
25.0000 mg | ORAL_TABLET | ORAL | Status: AC
  Administered 2013-11-04: 14:00:00 25 mg via ORAL
  Filled 2013-11-04: qty 1

## 2013-11-04 MED ORDER — METOPROLOL TARTRATE 12.5 MG HALF TABLET: 12.5000 mg | ORAL_TABLET | Freq: Two times a day (BID) | ORAL | Status: DC

## 2013-11-04 MED ORDER — WARFARIN SODIUM 1 MG PO TABS: 3.0000 mg | ORAL_TABLET | Freq: Every day | ORAL | Status: DC

## 2013-11-04 MED ORDER — DSS 100 MG PO CAPS: 200.0000 mg | ORAL_CAPSULE | Freq: Every day | ORAL | Status: DC

## 2013-11-04 MED ORDER — WARFARIN SODIUM 1 MG PO TABS: 1.0000 mg | ORAL_TABLET | Freq: Every day | ORAL | Status: DC

## 2013-11-04 MED ORDER — METOPROLOL TARTRATE 25 MG PO TABS: 25.0000 mg | ORAL_TABLET | Freq: Two times a day (BID) | ORAL | Status: DC

## 2013-11-04 MED ORDER — WARFARIN SODIUM 4 MG PO TABS
4.5000 mg | ORAL_TABLET | Freq: Once | ORAL | Status: DC
  Filled 2013-11-04: qty 1

## 2013-11-04 MED ORDER — WARFARIN SODIUM 3 MG PO TABS: 3.0000 mg | ORAL_TABLET | Freq: Every day | ORAL | Status: DC

## 2013-11-04 NOTE — Progress Notes (Signed)
SATURATION QUALIFICATIONS: (This note is used to comply with regulatory documentation for home oxygen)  Patient Saturations on Room Air at Rest = 86% Patient Saturations on Room Air while Ambulating = 82%  Patient Saturations on 4 Liters of oxygen while Ambulating = 90%  Please briefly explain why patient needs home oxygen: DOE, low saturations without 02

## 2013-11-04 NOTE — Progress Notes (Signed)
Patient complained earlier about having "chest pains" but stated that he felt like he was about to have a strong cough. RN tried asking patient if he was having chest tightness and patient stated yes. RN asked patient if he felt like he could not breath and patient stated yes. RN stated to patient that she would call the MD on call. Patient stated that he thought respiratory would be more appropriate to call. RN asked patient about "chest tightness" and patient that it had gone away but the strong cough it needed to come out. RN asked patient to state whether it was a cough that was hurting his chest or a tightness-patient stated cough. Respiratory came up and gave patient a treatment and patient stated to nurse that he felt a lot better. RN asked patient if the MD still needed to be called-Patient stated, "no, the treatment did the trick." Will continue to monitor

## 2013-11-04 NOTE — Progress Notes (Signed)
FMTS Attending Note   I personally saw and evaluated the patient. The plan of care was discussed with the resident team. I agree with the assessment and plan as documented by the resident.   Logan [name] continues to show improvement in his respiratory function. He is asking to go home. He is amenable to home health/PT  1.Acute Respiratory Failure with HCAP - improved, stable on Levaquin (complete 10 day course), will need home oxygen 2. Atrial Fibrillation - management per cardiology, currently on metoprolol, digoxin, and aspirin  3. Supratherapeutic INR - management per pharmacy  4. Hypotension - no further episodes  Disposition: patient stable for discharge with home oxygen and home health/PT.  Dossie Arbour MD

## 2013-11-04 NOTE — Progress Notes (Signed)
Called by RN HR was up to 140 with his chronic a fib, he is on lower dose of BB due to hypotension that is resolving.  I gave 25 mg po lopressor now and increased to 25 BID but he may need to go back to 50 BID as BP allows.

## 2013-11-04 NOTE — Progress Notes (Signed)
[facility] for Warfarin  Indication: atrial fibrillation  No Known Allergies  Patient Measurements: Height: 5\' 7"  (170.2 cm) Weight: 122 lb 5.7 oz (55.5 kg) IBW/kg (Calculated) : 66.1  Labs:  Recent Labs  11/02/13 0240 11/03/13 0354 11/03/13 0426 11/04/13 0540  HGB 13.8  --  13.4 13.2  HCT 41.2  --  40.5 39.8  PLT 198  --  205 194  LABPROT 33.7* 26.0*  --  22.9*  INR 3.48* 2.48*  --  2.10*  CREATININE 1.05  --  0.86 0.87    Medications:  Warfarin PTA: 4.5 mg on Sun/Tues, 3 mg all other days  Assessment: 80 y/o M here with progressive dyspnea. On chronic warfarin for afib. INR 2.1 after dose held 1/14 and appears to be trending down.  No bleeding reported and CBC is stable. Levaquin started 1/15 which can interact with warfarin and increase INR - will watch closely.  Goal of Therapy:  INR 2-3  Plan:  Coumadin 4.5 mg po x 1 dose today Daily INR Watch for s/sx bleeding  [facility], Pharm.D., BCPS Clinical Pharmacist Pager: [phone] 11/04/2013 10:59 AM

## 2013-11-04 NOTE — Discharge Summary (Signed)
I agree with the discharge summary as documented.   Mckenna Gamm MD  

## 2013-11-04 NOTE — Progress Notes (Signed)
Family Medicine Teaching Service Daily Progress Note Intern Pager: [phone]  Patient name: Logan [name] Medical record number: [id number] Date of birth: 02/20/1925 Age: 80 y.o. Gender: male  Primary Care Provider: Leonard Downing, MD Consultants: Cardiology Code Status: DNR  Pt Overview and Major Events to Date:  He was admitted on 10/30/2013 for acute respiratory distress. Being treated for CAP with Vanc for ?post flu Influenza in the setting of recent respiratory illness and now acute decompensation. His PMHx is significant for CHF with dilated cardiomyopathy, AFib HTN, HLD, Hypothyroidism, solitary pulmonary nodule and likely COPD by CXR.  1/11 - Hypoxic in ED to 70s on RA  1/12 - Profoundly acidotic ABG with increased work of breathing. Intolerant to BiPAP. Flu neg. Escalated abx to Primaxin/Vanc.  1/13 - Improving WOB; Metoprolol restarted for afib w/ RVR 1/14 - Afib rate controlled; Remains stable on 5L o2 1/15 - Transition to PO abx today  Assessment and Plan: 80 y/o male here with with acute respiratory distress. Being treated for CAP with Vanc for ?post flu Influenza in the setting of recent respiratory illness and now acute decompensation. His PMHx is significant for CHF with dilated cardiomyopathy, AFib HTN, HLD, Hypothyroidism, solitary pulmonary nodule and likely COPD by CXR.   PULM: Acute respiratory failure, CAP, Hypoxia, leukocytosis, Hx of Pulmonary nodule  - Supplemental O2 for O2 Sat <92%, Pulmonary toilet; Sputum culture/gram stain: Not collected; (Strep Pneumo Neg; Legionella Neg) - hold pain medications as pt does not have any significant discomfort  - Abx: Levaquin Day 2 T abx day 5 of 10  Vanc & Primaxin d/c'd 1/15 after 4 Days  - [place] @ 4L - Continue to wean: Ordered home O2 - PT/OT: recs Home health- ordered   Cardiac: A Fib  , CHF, Hypotension, PVCs, HLD  - Discussed with Dr. Wynonia Lawman who agrees this is likely primary pulmonary. [facility] aware   - troponins negative x4; PVCs chronic with doublets evident on telemetry. Dilt drip for RVR stopped 1/12 ~ noon  - coumadin per pharmacy (INR 2.10 on 1/16) - continue ASA, Lipitor, Digoxin (therapeutic)  - holding lasix  given hypotension  - ECHO: EF 50-55%, mild aortic regurg - Cardiology Consult  - Metoprolol 12.5 BID restarted 1/13  Sepsis with hypotension: Resolved   - Significant CHF hx; Sepsis Markers - Lactic acid 2.9 and PCT 2.41   HTN - hypotensive associated with sepsis: Resolved - holding valsartan  - Metoprolol as above  Hypothyroidism:  - continue home synthroid   Hx of Vocal Cord CA s/p radiation  FEN/GI: SLIV PROPHYLAXIS: On Heparin  DIET: Cardiac  Disposition: Attending [place]; Discharge intended for today w/ home O2  Subjective: No complaints. In tears asking to go home today.  Objective: Temp:  [97.6 F (36.4 C)-98.8 F (37.1 C)] 98.8 F (37.1 C) (01/16 0525) Pulse Rate:  [72-104] 72 (01/16 0525) Resp:  [20-32] 28 (01/16 0525) BP: (124-142)/(63-81) 142/81 mmHg (01/16 0525) SpO2:  [89 %-100 %] 91 % (01/16 0553)  Physical Exam: General: Elderly, cachectic caucasian male. In no discomfort; no respiratory  Cardiovascular: irregularly irregular w/ reg rate no m/r/g Respiratory: overall poor air movement. Crackles in the bases b/l. On 4L [place] Abdomen:+BS, soft, non-tender, no rigidity, no guarding, no masses/hepatosplenomegaly  Extremities:Warm, well perfused. Moves all 4 extremities spontaneously; no lateralization. Distal pulses +. No pretibial edema.   Laboratory:  Recent Labs Lab 11/02/13 0240 11/03/13 0426 11/04/13 0540  WBC 18.5* 14.5* 11.2*  HGB 13.8 13.4 13.2  HCT 41.2 40.5 39.8  PLT 198 205 194    Recent Labs Lab 10/30/13 1739 11/02/13 0240 11/03/13 0426 11/04/13 0540  NA 142 140 143 144  K 5.1 5.4* 4.9 4.5  CL 102 103 104 103  CO2 32 30 30 31   BUN 33* 35* 27* 24*  CREATININE 1.17 1.05 0.86 0.87  CALCIUM 9.1 9.0 9.0 8.7  PROT  7.4 5.9*  --   --   BILITOT 0.5 0.6  --   --   ALKPHOS 86 97  --   --   ALT 29 25  --   --   AST 33 29  --   --   GLUCOSE 194* 135* 132* 112*      Recent Labs Lab 10/30/13 1739 10/30/13 2025 10/31/13 0355 10/31/13 0827  TROPONINI <0.30 <0.30 <0.30 <0.30   Imaging/Diagnostic Tests: 1/11 - 1V CX - Hyper expanded, left mid lung streaky airspace disease highly suspicious for pneumonia.  1/12 - 1V CX - FINDINGS: Worsening consolidation in the left mid and lower lung. The right lung remains relatively well aerated, although extensively emphysematous. No change in heart size. Atelectasis contributes to the increase opacification, given volume loss on the left. No evidence of increasing pleural fluid or cavitation.  IMPRESSION:  Increased opacification of the left lower chest, a combination of worsening pneumonia and new atelectasis.  Phill Myron, MD 11/04/2013, 8:57 AM PGY-1, [place] Intern pager: [phone], text pages welcome

## 2013-11-04 NOTE — Progress Notes (Signed)
Occupational Therapy Treatment Patient Details Name: Logan [name] MRN: [id number] DOB: 01-Aug-1925 Today's Date: 11/04/2013 Time: 0037-0488 OT Time Calculation (min): 25 min  OT Assessment / Plan / Recommendation  History of present illness 80 y/o M with pmx of Afib,CHF,HLD,presented with 3 wks of worsening SOB and chest tightness with mild cough,he denies any fever,no sputum production,no sick contact.Patient feeling much better now on oxygen   OT comments  Pt. HR jumped to 160 with minimal activity and MD and nurse aware.   Follow Up Recommendations       Barriers to Discharge       Equipment Recommendations       Recommendations for Other Services    Frequency     Progress towards OT Goals Progress towards OT goals: Progressing toward goals  Plan      Precautions / Restrictions Precautions Precautions: Fall Restrictions Weight Bearing Restrictions: No   Pertinent Vitals/Pain No c/o    ADL  Grooming: Performed;Modified independent;Wash/dry face;Wash/dry hands Where Assessed - Grooming: Unsupported standing Toilet Transfer: Performed;Modified independent Toilet Transfer Method: Stand pivot Science writer: Comfort height toilet;Grab bars Toileting - Water quality scientist and Hygiene: Modified independent;Performed Where Assessed - Best boy and Hygiene: Standing ADL Comments: Pt. was Mod I to AMB in room.    OT Diagnosis:    OT Problem List:   OT Treatment Interventions:     OT Goals(current goals can now be found in the care plan section)    Visit Information  Last OT Received On: 11/04/13 Assistance Needed: +1 History of Present Illness: 80 y/o M with pmx of Afib,CHF,HLD,presented with 3 wks of worsening SOB and chest tightness with mild cough,he denies any fever,no sputum production,no sick contact.Patient feeling much better now on oxygen    Subjective Data      Prior Functioning       Cognition   Cognition Arousal/Alertness: Awake/alert Behavior During Therapy: WFL for tasks assessed/performed Overall Cognitive Status: Within Functional Limits for tasks assessed    Mobility       Exercises      Balance    End of Session    GO     Shahrukh Pasch 11/04/2013, 9:45 AM

## 2013-11-04 NOTE — Discharge Instructions (Signed)
·   Continue Levaquin one pill a day until prescription runs out  STOP TAKING Lasix (Furosemide) and valsartan (DIOVAN) for now: Your primary care physician may restart them later if you need them  Your new Metoprolol dose is 12.5 twice a day (THIS IS DECREASED FROM YOUR PREVIOUS DOSE)  Resume your Coumadin (Warfarin) 4.5 mg on Sun/Tues and 3 mg every day.   [place] SUMMARY TO YOUR DR VISIT ON Monday WITH DR Claris Gower MD

## 2013-11-04 NOTE — Progress Notes (Signed)
Subjective: No complaints except wanting to go home  Objective: Vital signs in last 24 hours: Temp:  [97.6 F (36.4 C)-98.8 F (37.1 C)] 98.8 F (37.1 C) (01/16 0525) Pulse Rate:  [72-96] 72 (01/16 0525) Resp:  [20-32] 28 (01/16 0525) BP: (126-142)/(63-81) 142/81 mmHg (01/16 0525) SpO2:  [89 %-100 %] 91 % (01/16 0553) Weight change:  Last BM Date: 11/02/13 Intake/Output from previous day: -370 01/15 0701 - 01/16 0700 In: -  Out: 370 [Urine:370] Intake/Output this shift:    PE: General:Pleasant affect, NAD Skin:Warm and dry, brisk capillary refill, rash on back, no pruritis  HEENT:normocephalic, sclera clear, mucus membranes moist Heart:irreg irreg without murmur, gallup, rub or click Lungs: without rales, + rhonchi, no wheezes XTG:GYIR, non tender, + BS, do not palpate liver spleen or masses Ext:1+ bil. lower ext edema Neuro:alert and oriented, MAE, follows commands, + facial symmetry   Lab Results:  Recent Labs  11/03/13 0426 11/04/13 0540  WBC 14.5* 11.2*  HGB 13.4 13.2  HCT 40.5 39.8  PLT 205 194   BMET  Recent Labs  11/03/13 0426 11/04/13 0540  NA 143 144  K 4.9 4.5  CL 104 103  CO2 30 31  GLUCOSE 132* 112*  BUN 27* 24*  CREATININE 0.86 0.87  CALCIUM 9.0 8.7   No results found for this basename: TROPONINI, CK, MB,  in the last 72 hours  Lab Results  Component Value Date   CHOL 136 10/31/2013   HDL 50 10/31/2013   LDLCALC 71 10/31/2013   TRIG 77 10/31/2013   CHOLHDL 2.7 10/31/2013   No results found for this basename: HGBA1C     Lab Results  Component Value Date   TSH 1.064 10/30/2013    Hepatic Function Panel  Recent Labs  11/02/13 0240  PROT 5.9*  ALBUMIN 2.2*  AST 29  ALT 25  ALKPHOS 97  BILITOT 0.6   No results found for this basename: CHOL,  in the last 72 hours No results found for this basename: PROTIME,  in the last 72 hours     Studies/Results: No results found.  Medications: I have reviewed the  patient's current medications. Scheduled Meds: . aspirin EC  81 mg Oral Daily  . atorvastatin  40 mg Oral q1800  . digoxin  0.125 mg Oral Daily  . docusate sodium  200 mg Oral Daily  . levofloxacin  750 mg Oral Daily  . levothyroxine  75 mcg Oral QAC breakfast  . metoprolol tartrate  12.5 mg Oral BID  . Warfarin - Pharmacist Dosing Inpatient   Does not apply q1800   Continuous Infusions:  PRN Meds:.acetaminophen, albuterol, guaiFENesin, ondansetron (ZOFRAN) IV  Assessment/Plan: Active Problems:   Chronic atrial fibrillation   Dilated cardiomyopathy   Hyperlipidemia   Acute on chronic diastolic congestive heart failure   Chronic diastolic CHF (congestive heart failure)   Solitary pulmonary nodule   Pneumonia  PLAN: INR therapeutic,  Though drifting further down, pharmacy following. A fib with rate control.  VS stable. Changed to po ABX today.  Rash on back ? From sheets. Poss d/c per pt today  LOS: 5 days   Time spent with pt. :15 minutes. [facility] R  Nurse Practitioner Certified Pager [phone] or after 5pm and on weekends call [phone] 11/04/2013, 10:25 AM   History and all data above reviewed.  Patient examined.  I agree with the findings as above. No chest pain.  Breathing better.  He wants  to go home The patient exam reveals [id number]  ,  Lungs: Decreased breath sounds  ,  Abd: Positive bowel sounds, no rebound no guarding, Ext No edema  .  All available labs, radiology testing, previous records reviewed. Agree with documented assessment and plan. Atrial fib.  Rate is OK.  He will probably need to have his beta blocker dose adjusted as an out patient.  He has a [facility] appt already scheduled and we will get follow up with Dr. [place] et al.   Minus Breeding  11:34 AM  11/04/2013

## 2013-11-06 LAB — CULTURE, RESPIRATORY W GRAM STAIN

## 2013-11-06 LAB — CULTURE, RESPIRATORY

## 2013-11-07 ENCOUNTER — Ambulatory Visit (INDEPENDENT_AMBULATORY_CARE_PROVIDER_SITE_OTHER): Payer: Medicare Other | Admitting: Pharmacist

## 2013-11-07 DIAGNOSIS — I4891 Unspecified atrial fibrillation: Secondary | ICD-10-CM

## 2013-11-07 LAB — POCT INR: INR: 4.8

## 2013-11-18 ENCOUNTER — Ambulatory Visit (INDEPENDENT_AMBULATORY_CARE_PROVIDER_SITE_OTHER): Payer: Medicare Other | Admitting: Pharmacist

## 2013-11-18 DIAGNOSIS — I4891 Unspecified atrial fibrillation: Secondary | ICD-10-CM

## 2013-11-18 DIAGNOSIS — Z5181 Encounter for therapeutic drug level monitoring: Secondary | ICD-10-CM

## 2013-11-18 LAB — POCT INR: INR: 2.2

## 2013-11-22 ENCOUNTER — Encounter: Payer: Medicare Other | Admitting: Nurse Practitioner

## 2013-11-24 ENCOUNTER — Ambulatory Visit (INDEPENDENT_AMBULATORY_CARE_PROVIDER_SITE_OTHER): Payer: Medicare Other | Admitting: Internal Medicine

## 2013-11-24 ENCOUNTER — Ambulatory Visit (INDEPENDENT_AMBULATORY_CARE_PROVIDER_SITE_OTHER)
Admission: RE | Admit: 2013-11-24 | Discharge: 2013-11-24 | Disposition: A | Discharge status: Home / Self Care | Payer: Medicare Other | Source: Ambulatory Visit | Attending: Internal Medicine | Admitting: Internal Medicine

## 2013-11-24 ENCOUNTER — Encounter: Payer: Self-pay | Admitting: Internal Medicine

## 2013-11-24 VITALS — BP 124/70 | HR 86 | Temp 97.6°F | Ht 70.5 in | Wt 139.4 lb

## 2013-11-24 DIAGNOSIS — J449 Chronic obstructive pulmonary disease, unspecified: Secondary | ICD-10-CM

## 2013-11-24 DIAGNOSIS — J189 Pneumonia, unspecified organism: Secondary | ICD-10-CM

## 2013-11-24 DIAGNOSIS — J9600 Acute respiratory failure, unspecified whether with hypoxia or hypercapnia: Secondary | ICD-10-CM

## 2013-11-24 NOTE — Progress Notes (Signed)
Subjective:    Patient ID: Logan [name], male    DOB: 05/15/1925   MRN: [id number]  HPI  75 yowm quit smoking 1992 referred 11/24/2013 by Dr Arelia Sneddon to pulmonary clinic for eval of new resp failure      Date of Admission: 10/30/2013 Date of Discharge: 11/04/13 Community-acquired-pneumonia Acute respiratory failure Afib w/ RVR HFpEF Sepsis HTN Hypothyroidism Constipation   Brief Hospital Course: He was admitted on 10/30/2013 for acute respiratory distress. His PMHx is significant for CHF with dilated cardiomyopathy, AFib HTN, HLD, Hypothyroidism, solitary pulmonary nodule and likely COPD by CXR. Flu was negative and he was treated for CAP with Vanc and Primaxin for 4 days due to hypoxia and new oxygen requirement. Cardiology was consulted to help manage his Afib (permanent) with RVR; Diltiazem drip was utilized, and switched back to Metoprolol with improvement. His antibiotics were narrowed to Levaquin once clinically improving. He was admitted with a new oxygen requirement, failed BiPAP due to worsening hypoxia, and treated with [place] O2 @ 5L which was unable to be weaned. Requiring 4L continuous at discharge .   CAP w/ Acute respiratory failure: Admitted to stepdown. Treated with Vanc and Primaxin x 4 days, then switched to Levaquin. New oxygen requirement; 4L Cardiac (Afib w/ RVR, HFpEF, Hypotension): Managed with diltiazem drip initially, and switched back to Metoprolol at decreased dose. ECHO showed EF 50-55%.   HTN: Lasix and Valsartan held for initial hypotension that resolved. Not restarted on d/c; BP well controlled @ 142/81 on discharge.   PT/OT: Established Home health. Given DME: shower chair, walker , portable O2    ECHO 11/02/13 Study Conclusions  - Left ventricle: The cavity size was normal. Wall thickness was normal. Systolic function was normal. The estimated ejection fraction was in the range of 50% to 55%. Although no diagnostic regional wall motion abnormality  was identified, this possibility cannot be completely excluded on the basis of this study. The study is not technically sufficient to allow evaluation of LV diastolic function. - Aortic valve: Mild regurgitation. - Right ventricle: The cavity size was mildly dilated. Wall thickness was normal. Systolic function was mildly reduced. - Right atrium: The atrium was moderately dilated. - Pulmonary arteries: Systolic pressure was moderately increased. PA peak pressure: 36mm Hg (S).     11/24/2013 post hosp initial office eval ov/Leo Weyandt re: acute resp failure    Prior to admit baseline = Doe x one year "when over do it" even going to mailbox and back sometimes   Not necessarily related to coughing. At Most could  do [employer], yard work= some weed eating and trimming   No obvious  Patterns in day to day or daytime variabilty or assoc chroni chronic cough or cp or chest tightness, subjective wheeze overt sinus or hb symptoms. No unusual exp hx or h/o childhood pna/ asthma or knowledge of premature birth.  Sleeping ok without nocturnal  or early am exacerbation  of respiratory  c/o's or need for noct saba. Also denies any obvious fluctuation of symptoms with weather or environmental changes or other aggravating or alleviating factors except as outlined above   Current Medications, Allergies, Complete Past Medical History, Past Surgical History, Family History, and Social History were reviewed in [employer] record.            Review of Systems  Constitutional: Negative for fever, chills, activity change, appetite change and unexpected weight change.  HENT: Negative for congestion, dental problem, postnasal drip, rhinorrhea, sneezing, sore  throat, trouble swallowing and voice change.   Eyes: Negative for visual disturbance.  Respiratory: Positive for cough and shortness of breath. Negative for choking.   Cardiovascular: Negative for chest pain and leg swelling.   Gastrointestinal: Negative for nausea, vomiting and abdominal pain.  Genitourinary: Negative for difficulty urinating.  Musculoskeletal: Negative for arthralgias.  Skin: Negative for rash.  Psychiatric/Behavioral: Negative for behavioral problems and confusion.       Objective:   Physical Exam  amb wm nad  Wt Readings from Last 3 Encounters:  11/24/13 139 lb 6.4 oz (63.231 kg)  10/30/13 122 lb 5.7 oz (55.5 kg)  08/15/13 134 lb 12.8 oz (61.145 kg)     HEENT mild turbinate edema.  Oropharynx no thrush or excess pnd or cobblestoning.  No JVD or cervical adenopathy. Mild accessory muscle hypertrophy. Trachea midline, nl thryroid. Chest was hyperinflated by percussion with diminished breath sounds and moderate increased exp time without wheeze. Hoover sign positive at mid inspiration. Regular rate and rhythm without murmur gallop or rub or increase P2 and 1+ pitting bilateral ext  edema.  Abd: no hsm, nl excursion. Ext warm without cyanosis or clubbing.    CXR  11/24/2013 :   1. Improved left-sided pneumonia, predominantly left upper lobe pneumonia with a smaller component of infiltrate in the left lower lobe. 2. New minimal right pleural effusion. Minimal left pleural effusion is likely stable. 3. COPD.      Assessment & Plan:

## 2013-11-24 NOTE — Patient Instructions (Addendum)
Stop all oil based vitamins and replace with powder if available Eat more fish - especially salmon   Please remember to go to the  x-ray department downstairs for your tests - we will call you with the results when they are available.  Please schedule a follow up office visit in 4 weeks, sooner if needed - stay on same amount of oxygen in meantime

## 2013-11-25 ENCOUNTER — Encounter: Payer: Medicare Other | Admitting: Nurse Practitioner

## 2013-11-25 DIAGNOSIS — J9612 Chronic respiratory failure with hypercapnia: Secondary | ICD-10-CM

## 2013-11-25 DIAGNOSIS — J9611 Chronic respiratory failure with hypoxia: Secondary | ICD-10-CM | POA: Insufficient documentation

## 2013-11-25 DIAGNOSIS — J449 Chronic obstructive pulmonary disease, unspecified: Secondary | ICD-10-CM | POA: Insufficient documentation

## 2013-11-25 NOTE — Assessment & Plan Note (Addendum)
Not completely recovered back to baseline - explained natural hx of CAP esp in pt with decreased baseline lung function > no further abx at this time.   In meantime should probably avoid oils based vitamin due to concerns with coughing inducing gerd/ oil aspiration

## 2013-11-25 NOTE — Assessment & Plan Note (Signed)
As I explained to this patient in detail:  although there may be significant copd present, it does not appear to be limiting activity tolerance any more than a set of worn tires limits someone from driving a car  around a parking lot.  A new set of Michelins might look good but would have no perceived impact on the performance of the car and would not be worth the cost.  That is to say:   this pt is so sedentary I don't recommend aggressive pulmonary rx at this point unless limiting symptoms arise or acute exacerbations become as issue,  Once he has recovered fully from cap will re-evaluate needs

## 2013-11-25 NOTE — Assessment & Plan Note (Signed)
See admit 10/30/13 > d/c on 4lpm 24/7   Adequate control on present rx, reviewed > no change in rx needed  For now

## 2013-12-12 ENCOUNTER — Ambulatory Visit (INDEPENDENT_AMBULATORY_CARE_PROVIDER_SITE_OTHER): Payer: Medicare Other | Admitting: *Deleted

## 2013-12-12 DIAGNOSIS — Z5181 Encounter for therapeutic drug level monitoring: Secondary | ICD-10-CM

## 2013-12-12 DIAGNOSIS — I4891 Unspecified atrial fibrillation: Secondary | ICD-10-CM

## 2013-12-12 LAB — POCT INR: INR: 2.2

## 2013-12-21 ENCOUNTER — Other Ambulatory Visit (INDEPENDENT_AMBULATORY_CARE_PROVIDER_SITE_OTHER): Payer: Medicare Other

## 2013-12-21 ENCOUNTER — Ambulatory Visit (INDEPENDENT_AMBULATORY_CARE_PROVIDER_SITE_OTHER)
Admission: RE | Admit: 2013-12-21 | Discharge: 2013-12-21 | Disposition: A | Discharge status: Home / Self Care | Payer: Medicare Other | Source: Ambulatory Visit | Attending: Internal Medicine | Admitting: Internal Medicine

## 2013-12-21 ENCOUNTER — Ambulatory Visit (INDEPENDENT_AMBULATORY_CARE_PROVIDER_SITE_OTHER): Payer: Medicare Other | Admitting: Internal Medicine

## 2013-12-21 ENCOUNTER — Encounter: Payer: Self-pay | Admitting: Internal Medicine

## 2013-12-21 VITALS — BP 114/72 | HR 81 | Temp 97.3°F | Ht 68.0 in | Wt 143.0 lb

## 2013-12-21 DIAGNOSIS — R0989 Other specified symptoms and signs involving the circulatory and respiratory systems: Secondary | ICD-10-CM

## 2013-12-21 DIAGNOSIS — R0600 Dyspnea, unspecified: Secondary | ICD-10-CM | POA: Insufficient documentation

## 2013-12-21 DIAGNOSIS — R0609 Other forms of dyspnea: Secondary | ICD-10-CM

## 2013-12-21 DIAGNOSIS — J9600 Acute respiratory failure, unspecified whether with hypoxia or hypercapnia: Secondary | ICD-10-CM

## 2013-12-21 DIAGNOSIS — J90 Pleural effusion, not elsewhere classified: Secondary | ICD-10-CM

## 2013-12-21 LAB — CBC WITH DIFFERENTIAL/PLATELET
Basophils Absolute: 0.1 10*3/uL (ref 0.0–0.1)
Basophils Relative: 0.5 % (ref 0.0–3.0)
EOS PCT: 2.3 % (ref 0.0–5.0)
Eosinophils Absolute: 0.2 10*3/uL (ref 0.0–0.7)
HEMATOCRIT: 38.9 % — AB (ref 39.0–52.0)
HEMOGLOBIN: 12.4 g/dL — AB (ref 13.0–17.0)
LYMPHS PCT: 12.4 % (ref 12.0–46.0)
Lymphs Abs: 1.2 10*3/uL (ref 0.7–4.0)
MCHC: 32 g/dL (ref 30.0–36.0)
MCV: 100.7 fl — ABNORMAL HIGH (ref 78.0–100.0)
MONOS PCT: 7.5 % (ref 3.0–12.0)
Monocytes Absolute: 0.7 10*3/uL (ref 0.1–1.0)
NEUTROS ABS: 7.7 10*3/uL (ref 1.4–7.7)
Neutrophils Relative %: 77.3 % — ABNORMAL HIGH (ref 43.0–77.0)
Platelets: 256 10*3/uL (ref 150.0–400.0)
RBC: 3.87 Mil/uL — AB (ref 4.22–5.81)
RDW: 15.8 % — AB (ref 11.5–14.6)
WBC: 9.9 10*3/uL (ref 4.5–10.5)

## 2013-12-21 LAB — BRAIN NATRIURETIC PEPTIDE: PRO B NATRI PEPTIDE: 206 pg/mL — AB (ref 0.0–100.0)

## 2013-12-21 LAB — BASIC METABOLIC PANEL
BUN: 18 mg/dL (ref 6–23)
CHLORIDE: 102 meq/L (ref 96–112)
CO2: 36 mEq/L — ABNORMAL HIGH (ref 19–32)
Calcium: 8.9 mg/dL (ref 8.4–10.5)
Creatinine, Ser: 1 mg/dL (ref 0.4–1.5)
GFR: 77.79 mL/min (ref >60.00)
Glucose, Bld: 106 mg/dL — ABNORMAL HIGH (ref 70–99)
Potassium: 4.6 mEq/L (ref 3.5–5.1)
SODIUM: 144 meq/L (ref 135–145)

## 2013-12-21 LAB — SEDIMENTATION RATE: Sed Rate: 16 mm/hr (ref 0–22)

## 2013-12-21 NOTE — Patient Instructions (Addendum)
Increase your losartan to one whole and also lasix to one whole daily   When you get home today take 2 whole lasix immediately   Please see patient coordinator before you leave today  to schedule to make sure you have 02 4lpm 24/7 with 6lpm with activity   Please remember to go to the lab and x-ray department downstairs for your tests - we will call you with the results when they are available.    See Tammy NP w/in 2 weeks with all your medications, even over the counter meds, separated in two separate bags, the ones you take no matter what vs the ones you stop once you feel better and take only as needed when you feel you need them.   Tammy  will generate for you a new user friendly medication calendar that will put [place] all on the same page re: your medication use.     Without this process, it simply isn't possible to assure that we are providing  your outpatient care  with  the attention to detail we feel you deserve.   If we cannot assure that you're getting that kind of care,  then we cannot manage your problem effectively from this clinic.  Once you have seen Tammy and we are sure that we're all on the same page with your medication use she will arrange follow up with me.

## 2013-12-21 NOTE — Assessment & Plan Note (Signed)
For now will try to rx conservatively with increase rx for hbp/vol overload and 02

## 2013-12-21 NOTE — Assessment & Plan Note (Signed)
See admit 10/30/13 > d/c on 4lpm 24/7  - as of 12/21/2013 rec 4lpm 247 except fo 6 lpm with activity

## 2013-12-21 NOTE — Assessment & Plan Note (Signed)
Multifactorial but clearly related to vol overload by exam/ cxr  rec lasix take 2 now then double maint dose Double main cozaar dose

## 2013-12-21 NOTE — Progress Notes (Signed)
Quick Note:  Spoke with pt and notified of results per Dr. Wert. Pt verbalized understanding and denied any questions.  ______ 

## 2013-12-21 NOTE — Progress Notes (Signed)
Quick Note:  LMTCB ______ 

## 2013-12-21 NOTE — Progress Notes (Signed)
Subjective:    Patient ID: Logan [name], male    DOB: May 05, 1925   MRN: [id number]  HPI  8 yowm quit smoking 1992 referred 11/24/2013 by Dr Arelia Sneddon to pulmonary clinic for eval of new resp failure      Date of Admission: 10/30/2013 Date of Discharge: 11/04/13 Community-acquired-pneumonia Acute respiratory failure Afib w/ RVR HFpEF Sepsis HTN Hypothyroidism Constipation   Brief Hospital Course: He was admitted on 10/30/2013 for acute respiratory distress. His PMHx is significant for CHF with dilated cardiomyopathy, AFib HTN, HLD, Hypothyroidism, solitary pulmonary nodule and likely COPD by CXR. Flu was negative and he was treated for CAP with Vanc and Primaxin for 4 days due to hypoxia and new oxygen requirement. Cardiology was consulted to help manage his Afib (permanent) with RVR; Diltiazem drip was utilized, and switched back to Metoprolol with improvement. His antibiotics were narrowed to Levaquin once clinically improving. He was admitted with a new oxygen requirement, failed BiPAP due to worsening hypoxia, and treated with [place] O2 @ 5L which was unable to be weaned. Requiring 4L continuous at discharge .   CAP w/ Acute respiratory failure: Admitted to stepdown. Treated with Vanc and Primaxin x 4 days, then switched to Levaquin. New oxygen requirement; 4L Cardiac (Afib w/ RVR, HFpEF, Hypotension): Managed with diltiazem drip initially, and switched back to Metoprolol at decreased dose. ECHO showed EF 50-55%.   HTN: Lasix and Valsartan held for initial hypotension that resolved. Not restarted on d/c; BP well controlled @ 142/81 on discharge.   PT/OT: Established Home health. Given DME: shower chair, walker , portable O2    ECHO 11/02/13 Study Conclusions  - Left ventricle: The cavity size was normal. Wall thickness was normal. Systolic function was normal. The estimated ejection fraction was in the range of 50% to 55%. Although no diagnostic regional wall motion abnormality  was identified, this possibility cannot be completely excluded on the basis of this study. The study is not technically sufficient to allow evaluation of LV diastolic function. - Aortic valve: Mild regurgitation. - Right ventricle: The cavity size was mildly dilated. Wall thickness was normal. Systolic function was mildly reduced. - Right atrium: The atrium was moderately dilated. - Pulmonary arteries: Systolic pressure was moderately increased. PA peak pressure: 16m Hg (S).     11/24/2013 post hosp initial office eval ov/Koreena Joost re: acute resp failure    Prior to admit baseline = Doe x one year "when over do it" even going to mailbox and back sometimes   Not necessarily related to coughing. At Most could  do [employer], yard work= some weed eating and trimming  rec Stop all oil based vitamins and replace with powder if available Eat more fish - especially salmon  Please remember to go to the  x-ray department downstairs for your tests - we will call you with the results when they are available. Please schedule a follow up office visit in 4 weeks, sooner if needed - stay on same amount of oxygen in meantime  = 4lpm   12/21/2013 f/u ov/Marysol Wellnitz re: new resp failure ? Etiology  Chief Complaint  Patient presents with  . Follow-up    Breathing is worse since last visit- esp in the am, has diff with getting dressed. Cough is some better. Sats 79%2lpm cont. at rest--increased to 74%4lpm cont.    no orthopnea but sob when stirring around, comfortable at rest  Started on cozaar and lasix x 4 days prior to OV  By Dr EArelia Sneddonwith  progressive bilateral lower ext swelling but no better yet. Not wearing 02 as rec, just leaves on 2lpm due to small portable tank  No obvious day to day or daytime variabilty or assoc chronic cough or cp or chest tightness, subjective wheeze overt sinus or hb symptoms. No unusual exp hx or h/o childhood pna/ asthma or knowledge of premature birth.  Sleeping ok without nocturnal   or early am exacerbation  of respiratory  c/o's or need for noct saba. Also denies any obvious fluctuation of symptoms with weather or environmental changes or other aggravating or alleviating factors except as outlined above   Current Medications, Allergies, Complete Past Medical History, Past Surgical History, Family History, and Social History were reviewed in [employer] record.  ROS  The following are not active complaints unless bolded sore throat, dysphagia, dental problems, itching, sneezing,  nasal congestion or excess/ purulent secretions, ear ache,   fever, chills, sweats, unintended wt loss, pleuritic or exertional cp, hemoptysis,  orthopnea pnd or leg swelling, presyncope, palpitations, heartburn, abdominal pain, anorexia, nausea, vomiting, diarrhea  or change in bowel or urinary habits, change in stools or urine, dysuria,hematuria,  rash, arthralgias, visual complaints, headache, numbness weakness or ataxia or problems with walking or coordination,  change in mood/affect or memory.             Objective:   Physical Exam  amb wm nad   12/21/2013          143  Wt Readings from Last 3 Encounters:  11/24/13 139 lb 6.4 oz (63.231 kg)  10/30/13 122 lb 5.7 oz (55.5 kg)  08/15/13 134 lb 12.8 oz (61.145 kg)     HEENT mild turbinate edema.  Oropharynx no thrush or excess pnd or cobblestoning.  No JVD or cervical adenopathy. Mild accessory muscle hypertrophy. Trachea midline, nl thryroid. Chest was hyperinflated by percussion with diminished breath sounds and moderate increased exp time without wheeze. Dullness both bases. Hoover sign positive at mid inspiration. Regular rate and rhythm without murmur gallop or rub or increase P2 and 2+ pitting bilateral ext lower edema.  Abd: no hsm, nl excursion. Ext warm without cyanosis or clubbing.        CXR  12/21/2013 :  1. Persistent bilateral pleural effusions. 2. No change in aeration to the lungs.  Labs 12/21/2013  Esr  16,  hc03 36  Lab Results  Component Value Date   PROBNP 206.0* 12/21/2013          Assessment & Plan:

## 2014-01-04 ENCOUNTER — Encounter: Payer: Self-pay | Admitting: Adult Health

## 2014-01-04 ENCOUNTER — Ambulatory Visit (INDEPENDENT_AMBULATORY_CARE_PROVIDER_SITE_OTHER): Payer: Medicare Other | Admitting: Adult Health

## 2014-01-04 VITALS — BP 106/64 | HR 67 | Temp 97.8°F | Ht 68.0 in | Wt 141.0 lb

## 2014-01-04 DIAGNOSIS — R911 Solitary pulmonary nodule: Secondary | ICD-10-CM

## 2014-01-04 DIAGNOSIS — I509 Heart failure, unspecified: Secondary | ICD-10-CM

## 2014-01-04 DIAGNOSIS — I5032 Chronic diastolic (congestive) heart failure: Secondary | ICD-10-CM

## 2014-01-04 DIAGNOSIS — J449 Chronic obstructive pulmonary disease, unspecified: Secondary | ICD-10-CM

## 2014-01-04 DIAGNOSIS — J90 Pleural effusion, not elsewhere classified: Secondary | ICD-10-CM

## 2014-01-04 NOTE — Assessment & Plan Note (Signed)
Former smoker Needs PFTs 

## 2014-01-04 NOTE — Patient Instructions (Signed)
Follow medication calendar closely and bring to each visit. Continue on current regimen Followup in 2 weeks with Dr. Melvyn Novas with a pulmonary function test Please contact office for sooner follow up if symptoms do not improve or worsen or seek emergency care

## 2014-01-04 NOTE — Assessment & Plan Note (Signed)
Recent decompensation , improving with diuresis .

## 2014-01-04 NOTE — Progress Notes (Signed)
Subjective:    Patient ID: Logan [name], male    DOB: May 05, 1925   MRN: [id number]  HPI  8 yowm quit smoking 1992 referred 11/24/2013 by Dr Arelia Sneddon to pulmonary clinic for eval of new resp failure      Date of Admission: 10/30/2013 Date of Discharge: 11/04/13 Community-acquired-pneumonia Acute respiratory failure Afib w/ RVR HFpEF Sepsis HTN Hypothyroidism Constipation   Brief Hospital Course: He was admitted on 10/30/2013 for acute respiratory distress. His PMHx is significant for CHF with dilated cardiomyopathy, AFib HTN, HLD, Hypothyroidism, solitary pulmonary nodule and likely COPD by CXR. Flu was negative and he was treated for CAP with Vanc and Primaxin for 4 days due to hypoxia and new oxygen requirement. Cardiology was consulted to help manage his Afib (permanent) with RVR; Diltiazem drip was utilized, and switched back to Metoprolol with improvement. His antibiotics were narrowed to Levaquin once clinically improving. He was admitted with a new oxygen requirement, failed BiPAP due to worsening hypoxia, and treated with [place] O2 @ 5L which was unable to be weaned. Requiring 4L continuous at discharge .   CAP w/ Acute respiratory failure: Admitted to stepdown. Treated with Vanc and Primaxin x 4 days, then switched to Levaquin. New oxygen requirement; 4L Cardiac (Afib w/ RVR, HFpEF, Hypotension): Managed with diltiazem drip initially, and switched back to Metoprolol at decreased dose. ECHO showed EF 50-55%.   HTN: Lasix and Valsartan held for initial hypotension that resolved. Not restarted on d/c; BP well controlled @ 142/81 on discharge.   PT/OT: Established Home health. Given DME: shower chair, walker , portable O2    ECHO 11/02/13 Study Conclusions  - Left ventricle: The cavity size was normal. Wall thickness was normal. Systolic function was normal. The estimated ejection fraction was in the range of 50% to 55%. Although no diagnostic regional wall motion abnormality  was identified, this possibility cannot be completely excluded on the basis of this study. The study is not technically sufficient to allow evaluation of LV diastolic function. - Aortic valve: Mild regurgitation. - Right ventricle: The cavity size was mildly dilated. Wall thickness was normal. Systolic function was mildly reduced. - Right atrium: The atrium was moderately dilated. - Pulmonary arteries: Systolic pressure was moderately increased. PA peak pressure: 16m Hg (S).     11/24/2013 post hosp initial office eval ov/Wert re: acute resp failure    Prior to admit baseline = Doe x one year "when over do it" even going to mailbox and back sometimes   Not necessarily related to coughing. At Most could  do [employer], yard work= some weed eating and trimming  rec Stop all oil based vitamins and replace with powder if available Eat more fish - especially salmon  Please remember to go to the  x-ray department downstairs for your tests - we will call you with the results when they are available. Please schedule a follow up office visit in 4 weeks, sooner if needed - stay on same amount of oxygen in meantime  = 4lpm   12/21/2013 f/u ov/Wert re: new resp failure ? Etiology  Chief Complaint  Patient presents with  . Follow-up    Breathing is worse since last visit- esp in the am, has diff with getting dressed. Cough is some better. Sats 79%2lpm cont. at rest--increased to 74%4lpm cont.    no orthopnea but sob when stirring around, comfortable at rest  Started on cozaar and lasix x 4 days prior to OV  By Dr EArelia Sneddonwith  progressive bilateral lower ext swelling but no better yet. Not wearing 02 as rec, just leaves on 2lpm due to small portable tank >>Increase your losartan to one whole and also lasix to one whole daily , 2 lasix first day x 1 , BNP decreased at 206.  O2 , 24/7, 4l/ rest, 6 l act    01/04/2014 Follow up and Med review  Patient returns for a followup and medication review. We  reviewed all his medications and organized them into a medication calendar. Last visit. Patient was with increased lower shotty swelling. He was increased to Lasix to 40 mg daily, along with Cozaar 25 mg daily . Says that his lower extremity swelling has improved. Has noticed a slight improvement in shortness of breath. However, continues to get short of breath with minimal activity. He is down on oxygen 4 L at rest and 6 L with activity. Patient denies any hemoptysis, orthopnea, PND, or chest pain. Patient is a former smoker , Pulmonary  function test is pending Labs last visit showed BNP was decreased to 206. Sedimentation rate was normal  Chest x-ray showed no significant change in small to moderate bilateral pleural effusions with persistent multifocal pulmonary opacities in mid left lung.     Current Medications, Allergies, Complete Past Medical History, Past Surgical History, Family History, and Social History were reviewed in [employer] record.  ROS  The following are not active complaints unless bolded sore throat, dysphagia, dental problems, itching, sneezing,  nasal congestion or excess/ purulent secretions, ear ache,   fever, chills, sweats, unintended wt loss, pleuritic or exertional cp, hemoptysis,  orthopnea pnd or leg swelling, presyncope, palpitations, heartburn, abdominal pain, anorexia, nausea, vomiting, diarrhea  or change in bowel or urinary habits, change in stools or urine, dysuria,hematuria,  rash, arthralgias, visual complaints, headache, numbness weakness or ataxia or problems with walking or coordination,  change in mood/affect or memory.             Objective:   Physical Exam  amb wm nad   12/21/2013          143>141 01/04/2014     HEENT mild turbinate edema.  Oropharynx no thrush or excess pnd or cobblestoning.  No JVD or cervical adenopathy. Mild accessory muscle hypertrophy. Trachea midline, nl thryroid. Chest was hyperinflated by percussion  with diminished breath sounds and moderate increased exp time without wheeze. Dullness both bases. Hoover sign positive at mid inspiration. Regular rate and rhythm without murmur gallop or rub or increase P2 and 1+ pitting bilateral ext lower edema.  Abd: no hsm, nl excursion. Ext warm without cyanosis or clubbing.        CXR  12/21/2013 :  1. Persistent bilateral pleural effusions. 2. No change in aeration to the lungs.  Labs 12/21/2013  Esr 16,  hc03 36  Lab Results  Component Value Date   PROBNP 206.0* 12/21/2013          Assessment & Plan:

## 2014-01-04 NOTE — Assessment & Plan Note (Signed)
Repeat cxr on return ov in 2 weeks

## 2014-01-09 ENCOUNTER — Ambulatory Visit (INDEPENDENT_AMBULATORY_CARE_PROVIDER_SITE_OTHER): Payer: Medicare Other | Admitting: Pharmacist

## 2014-01-09 DIAGNOSIS — I4891 Unspecified atrial fibrillation: Secondary | ICD-10-CM

## 2014-01-09 DIAGNOSIS — Z5181 Encounter for therapeutic drug level monitoring: Secondary | ICD-10-CM

## 2014-01-09 LAB — POCT INR: INR: 1.7

## 2014-01-18 ENCOUNTER — Ambulatory Visit: Payer: Medicare Other | Admitting: Cardiology

## 2014-01-19 ENCOUNTER — Ambulatory Visit (INDEPENDENT_AMBULATORY_CARE_PROVIDER_SITE_OTHER): Payer: Medicare Other | Admitting: Pharmacist

## 2014-01-19 ENCOUNTER — Other Ambulatory Visit: Payer: Self-pay

## 2014-01-19 ENCOUNTER — Ambulatory Visit (INDEPENDENT_AMBULATORY_CARE_PROVIDER_SITE_OTHER): Payer: Medicare Other | Admitting: Cardiology

## 2014-01-19 ENCOUNTER — Encounter: Payer: Self-pay | Admitting: Cardiology

## 2014-01-19 VITALS — BP 100/50 | HR 89 | Ht 68.0 in | Wt 135.8 lb

## 2014-01-19 DIAGNOSIS — Z5181 Encounter for therapeutic drug level monitoring: Secondary | ICD-10-CM

## 2014-01-19 DIAGNOSIS — J9610 Chronic respiratory failure, unspecified whether with hypoxia or hypercapnia: Secondary | ICD-10-CM

## 2014-01-19 DIAGNOSIS — I4891 Unspecified atrial fibrillation: Secondary | ICD-10-CM

## 2014-01-19 DIAGNOSIS — J90 Pleural effusion, not elsewhere classified: Secondary | ICD-10-CM

## 2014-01-19 DIAGNOSIS — I5033 Acute on chronic diastolic (congestive) heart failure: Secondary | ICD-10-CM

## 2014-01-19 DIAGNOSIS — I420 Dilated cardiomyopathy: Secondary | ICD-10-CM

## 2014-01-19 DIAGNOSIS — Z7901 Long term (current) use of anticoagulants: Secondary | ICD-10-CM

## 2014-01-19 DIAGNOSIS — R0602 Shortness of breath: Secondary | ICD-10-CM

## 2014-01-19 DIAGNOSIS — I5032 Chronic diastolic (congestive) heart failure: Secondary | ICD-10-CM

## 2014-01-19 DIAGNOSIS — I509 Heart failure, unspecified: Secondary | ICD-10-CM

## 2014-01-19 LAB — POCT INR: INR: 1.7

## 2014-01-19 MED ORDER — FUROSEMIDE 40 MG PO TABS: ORAL_TABLET | ORAL | Status: DC

## 2014-01-19 NOTE — Patient Instructions (Addendum)
Stop taking ASA.  Add an extra half of lasix in the afternoon 20 mg.   Continue your other therapy  I will see you in 2 months

## 2014-01-19 NOTE — Progress Notes (Signed)
Logan [name] Date of Birth: [birth year]   History of Present Illness: Logan [name] is seen for followup today. He has a history of permanent atrial fibrillation managed with rate control and anticoagulation. He also has chronic systolic heart failure with last ejection fraction of 55-60%. He was admitted to the hospital in January with community acquired PNA and respiratory failure. He also had increased ventricular response of AFib and diastolic heart failure. Since DC he has continued on home oxygen. Seen recently by Dr. Melvyn Novas with increased dyspnea. CXR showed bilateral pleural effusions. He had increased edema to the point he couldn't get his boots on. His lasix was increased for a few days with improvement in his breathing and edema. He still has some edema and nocturia. Oxygen sats still low when checked by Dr. Melvyn Novas. BNP down to 206. Remains on Coumadin. Started on ASA for reasons that are unclear.   Current Outpatient Prescriptions on File Prior to Visit  Medication Sig Dispense Refill  . Calcium-Magnesium-Zinc (CAL-MAG-ZINC PO) Take 1 tablet by mouth daily.       Marland Kitchen dextromethorphan-guaiFENesin (MUCINEX DM) 30-600 MG per 12 hr tablet Take 1-2 tablets by mouth every 12 (twelve) hours as needed (cough, congestion).      Marland Kitchen digoxin (LANOXIN) 0.125 MG tablet Take 1 tablet (0.125 mg total) by mouth daily.  30 tablet  11  . furosemide (LASIX) 40 MG tablet Take 40 mg by mouth daily.       Marland Kitchen levothyroxine (SYNTHROID, LEVOTHROID) 75 MCG tablet Take 75 mcg by mouth daily before breakfast.      . losartan (COZAAR) 25 MG tablet Take 25 mg by mouth daily.       . metoprolol tartrate (LOPRESSOR) 25 MG tablet Take 1 tablet (25 mg total) by mouth 2 (two) times daily.  60 tablet  0  . Multiple Vitamin (MULTIVITAMIN) tablet Take 1 tablet by mouth daily.       . simvastatin (ZOCOR) 80 MG tablet Take 1 tablet (80 mg total) by mouth at bedtime.  30 tablet  11  . tamsulosin (FLOMAX) 0.4 MG CAPS capsule Take  0.4 mg by mouth.      . warfarin (COUMADIN) 3 MG tablet Take 1 tablet (3 mg total) by mouth daily. Sundays and Tuesday takes 4.5mg  All other days 3mg   30 tablet  0   No current facility-administered medications on file prior to visit.    No Known Allergies  Past Medical History  Diagnosis Date  . Chronic atrial fibrillation   . Dilated cardiomyopathy   . PVC's (premature ventricular contractions)   . Hyperlipidemia   . COPD (chronic obstructive pulmonary disease)   . Hypothyroidism   . Renal calculi   . Chronic anticoagulation   . CHF (congestive heart failure)     DUE TO SYSTOLIC DYSFUNCTION  . Shortness of breath     exertional  . Cancer     vocal cord - radiation     Past Surgical History  Procedure Laterality Date  . Cardiac catheterization  02/26/2007    EF 35-40%  . Appendectomy    . Tonsillectomy and adenoidectomy    . US echocardiography  07/08/2010    EF 50-55%  . US echocardiography  02/16/2007    EF 25-35%  . Transthoracic echocardiogram  08/26/2001    EF 25-30%  . Cardiovascular stress test  02/18/2007    EF 40%  . Microlaryngoscopy with co2 laser and excision of vocal cord lesion  15 years ago  . Lithotripsy    . Inguinal hernia repair Right 12/28/2012    Procedure: HERNIA REPAIR INGUINAL ADULT;  Surgeon: Joyice Faster. Cornett, MD;  Location: [facility];  Service: General;  Laterality: Right;    History  Smoking status  . Former Smoker -- 1.00 packs/day for 35 years  . Types: Cigarettes  . Quit date: 07/12/1991  Smokeless tobacco  . Not on file    History  Alcohol Use No    Family History  Problem Relation Age of Onset  . Stroke Mother   . Seizures Mother   . Heart attack Father   . Heart failure Father   . Heart attack Brother     Review of Systems: As per history of present illness.  All other systems were reviewed and are negative.  Physical Exam: BP 100/50  Pulse 89  Ht 5\' 8"  (1.727 m)  Wt 135 lb 12.8 oz (61.598 kg)  BMI 20.65  kg/m2 He is a pleasant white male in no acute distress. He is wearing oxygen.The HEENT exam is normal. The carotids are 2+ without bruits.  There is no thyromegaly.  There is mild-mod JVD.  The lungs reveal diminished BS throughout. Rales in left base.  The heart exam reveals an irregular rate with a normal S1 and S2.  There are no murmurs, gallops, or rubs.  The PMI is not displaced.   Abdominal exam reveals good bowel sounds.  There is no hepatosplenomegaly or tenderness.    Exam of the legs 1-2+ pretibial edema.  Pedal pulses are intact.  Cranial nerves II - XII are intact.  Motor and sensory functions are intact.  The gait is normal.  LABORATORY DATA: Lab Results  Component Value Date   WBC 9.9 12/21/2013   HGB 12.4* 12/21/2013   HCT 38.9* 12/21/2013   PLT 256.0 12/21/2013   GLUCOSE 106* 12/21/2013   CHOL 136 10/31/2013   TRIG 77 10/31/2013   HDL 50 10/31/2013   LDLCALC 71 10/31/2013   ALT 25 11/02/2013   AST 29 11/02/2013   NA 144 12/21/2013   K 4.6 12/21/2013   CL 102 12/21/2013   CREATININE 1.0 12/21/2013   BUN 18 12/21/2013   CO2 36* 12/21/2013   TSH 1.064 10/30/2013   INR 1.7 01/19/2014   Echo: 11/02/13:Study Conclusions  - Left ventricle: The cavity size was normal. Wall thickness was normal. Systolic function was normal. The estimated ejection fraction was in the range of 50% to 55%. Although no diagnostic regional wall motion abnormality was identified, this possibility cannot be completely excluded on the basis of this study. The study is not technically sufficient to allow evaluation of LV diastolic function. - Aortic valve: Mild regurgitation. - Right ventricle: The cavity size was mildly dilated. Wall thickness was normal. Systolic function was mildly reduced. - Right atrium: The atrium was moderately dilated. - Pulmonary arteries: Systolic pressure was moderately increased. PA peak pressure: 44mm Hg (S).  Ecg: 10/31/13 showed AFib with RVR 119. Old septal infarct. Right axis. Nonspecific  ST-T changes.  Assessment / Plan: 1. Atrial fibrillation, permanent. Rate is well controlled today. We will continue on his current dose of metoprolol and digoxin. Continue anticoagulation with Coumadin. He does not need to be on ASA as this just increases bleeding risk.   2. History of dilated cardiomyopathy. In 2008 ejection fraction was 25-35%. Repeat echocardiogram in Jan showed EF of 50-55%. He will continue with metoprolol and ARB. BP is soft.  3.  Acute on chronic diastolic CHF. Improved with diuresis but still volume overloaded with edema. Will increase lasix to 40 mg in the am and 20 mg in the pm. Follow up in 2 months with BMET and BNP.  4. Hyperlipidemia. Continue simvastatin and fish oil.  5. Acute on chronic respiratory failure s/p PNA in January. Pulmonary HTN by Echo. Scheduled for PFTs later this month with Dr. Melvyn Novas. Will try and optimize volume status as BP will allow. Continue oxygen therapy.

## 2014-02-06 ENCOUNTER — Ambulatory Visit (INDEPENDENT_AMBULATORY_CARE_PROVIDER_SITE_OTHER): Payer: Medicare Other | Admitting: *Deleted

## 2014-02-06 DIAGNOSIS — I4891 Unspecified atrial fibrillation: Secondary | ICD-10-CM

## 2014-02-06 DIAGNOSIS — Z5181 Encounter for therapeutic drug level monitoring: Secondary | ICD-10-CM

## 2014-02-06 LAB — POCT INR: INR: 1.7

## 2014-02-07 ENCOUNTER — Ambulatory Visit (INDEPENDENT_AMBULATORY_CARE_PROVIDER_SITE_OTHER): Payer: Medicare Other | Admitting: Internal Medicine

## 2014-02-07 ENCOUNTER — Encounter: Payer: Self-pay | Admitting: Internal Medicine

## 2014-02-07 VITALS — BP 90/58 | HR 62 | Temp 97.8°F | Ht 67.0 in | Wt 135.0 lb

## 2014-02-07 DIAGNOSIS — R911 Solitary pulmonary nodule: Secondary | ICD-10-CM

## 2014-02-07 DIAGNOSIS — J9610 Chronic respiratory failure, unspecified whether with hypoxia or hypercapnia: Secondary | ICD-10-CM

## 2014-02-07 DIAGNOSIS — J449 Chronic obstructive pulmonary disease, unspecified: Secondary | ICD-10-CM

## 2014-02-07 MED ORDER — UMECLIDINIUM-VILANTEROL 62.5-25 MCG/INH IN AEPB
INHALATION_SPRAY | RESPIRATORY_TRACT | Status: DC
Start: 2014-02-07 — End: 2014-03-02

## 2014-02-07 NOTE — Assessment & Plan Note (Signed)
-   PFT's 02/07/14  FEV1  0.69 (31%) ratio 41 no change p B2 and DLCO 30%  I had an extended discussion with the patient today lasting 15 to 20 minutes of a 25 minute visit on the following issues:  His goal is to get off 02 so best option is optimize copd > trial of anoro

## 2014-02-07 NOTE — Patient Instructions (Signed)
Start Anoro take 2 puffs off one click each am Ok to leave off 02 at rest but wear it at 2lpm with acitivity and at bedtime  See calendar for specific medication instructions and bring it back for each and every office visit for every healthcare provider you see.  Without it,  you may not receive the best quality medical care that we feel you deserve.  You will note that the calendar groups together  your maintenance  medications that are timed at particular times of the day.  Think of this as your checklist for what your doctor has instructed you to do until your next evaluation to see what benefit  there is  to staying on a consistent group of medications intended to keep you well.  The other group at the bottom is entirely up to you to use as you see fit  for specific symptoms that may arise between visits that require you to treat them on an as needed basis.  Think of this as your action plan or "what if" list.   Separating the top medications from the bottom group is fundamental to providing you adequate care going forward.    Please schedule a follow up office visit in 4 weeks, sooner if needed

## 2014-02-07 NOTE — Assessment & Plan Note (Signed)
See admit 10/30/13 > d/c'd on 4lpm 24/7  - as of 12/21/2013 rec 4lpm 247 except fo 6 lpm with activity  - 02/07/2014 93% RA so rec 02 2lpm at hs and with activity

## 2014-02-07 NOTE — Progress Notes (Signed)
PFT done today. 

## 2014-02-07 NOTE — Progress Notes (Signed)
Subjective:    Patient ID: JANE BROUGHTON, male    DOB: 1925/04/18   MRN: [id number]    Brief patient profile:  3 yowm quit smoking 1992 referred 11/24/2013 by Dr Arelia Sneddon to pulmonary clinic for eval of new resp failure     History of Present Illness  Date of Admission: 10/30/2013 Date of Discharge: 11/04/13 Community-acquired-pneumonia Acute respiratory failure Afib w/ RVR HFpEF Sepsis HTN Hypothyroidism Constipation   Brief Hospital Course: He was admitted on 10/30/2013 for acute respiratory distress. His PMHx is significant for CHF with dilated cardiomyopathy, AFib HTN, HLD, Hypothyroidism, solitary pulmonary nodule and likely COPD by CXR. Flu was negative and he was treated for CAP with Vanc and Primaxin for 4 days due to hypoxia and new oxygen requirement. Cardiology was consulted to help manage his Afib (permanent) with RVR; Diltiazem drip was utilized, and switched back to Metoprolol with improvement. His antibiotics were narrowed to Levaquin once clinically improving. He was admitted with a new oxygen requirement, failed BiPAP due to worsening hypoxia, and treated with [place] O2 @ 5L which was unable to be weaned. Requiring 4L continuous at discharge .   CAP w/ Acute respiratory failure: Admitted to stepdown. Treated with Vanc and Primaxin x 4 days, then switched to Levaquin. New oxygen requirement; 4L Cardiac (Afib w/ RVR, HFpEF, Hypotension): Managed with diltiazem drip initially, and switched back to Metoprolol at decreased dose. ECHO showed EF 50-55%.   HTN: Lasix and Valsartan held for initial hypotension that resolved. Not restarted on d/c; BP well controlled @ 142/81 on discharge.   PT/OT: Established Home health. Given DME: shower chair, walker , portable O2    ECHO 11/02/13 Study Conclusions  - Left ventricle: The cavity size was normal. Wall thickness was normal. Systolic function was normal. The estimated ejection fraction was in the range of 50% to 55%. Although no  diagnostic regional wall motion abnormality was identified, this possibility cannot be completely excluded on the basis of this study. The study is not technically sufficient to allow evaluation of LV diastolic function. - Aortic valve: Mild regurgitation. - Right ventricle: The cavity size was mildly dilated. Wall thickness was normal. Systolic function was mildly reduced. - Right atrium: The atrium was moderately dilated. - Pulmonary arteries: Systolic pressure was moderately increased. PA peak pressure: 76m Hg (S).     11/24/2013 post hosp initial office eval ov/Martinique Pizzimenti re: acute resp failure  / cor pulmonale   Prior to admit baseline = Doe x one year "when over do it" even going to mailbox and back sometimes   Not necessarily related to coughing. At Most could  do [employer], yard work= some weed eating and trimming  rec Stop all oil based vitamins and replace with powder if available Eat more fish - especially salmon  Please remember to go to the  x-ray department downstairs for your tests - we will call you with the results when they are available. Please schedule a follow up office visit in 4 weeks, sooner if needed - stay on same amount of oxygen in meantime  = 4lpm   12/21/2013 f/u ov/Briyana Badman re: new resp failure ? Etiology  Chief Complaint  Patient presents with  . Follow-up    Breathing is worse since last visit- esp in the am, has diff with getting dressed. Cough is some better. Sats 79%2lpm cont. at rest--increased to 74%4lpm cont.    no orthopnea but sob when stirring around, comfortable at rest  Started on cozaar and lasix  x 4 days prior to OV  By Dr Arelia Sneddon with progressive bilateral lower ext swelling but no better yet. Not wearing 02 as rec, just leaves on 2lpm due to small portable tank >>Increase your losartan to one whole and also lasix to one whole daily , 2 lasix first day x 1 , BNP decreased at 206.  O2 , 24/7, 4l/ rest, 6 l act    01/04/2014 Follow up and Med review   Patient returns for a followup and medication review. We reviewed all his medications and organized them into a medication calendar. Last visit. Patient was with increased lower shotty swelling. He was increased to Lasix to 40 mg daily, along with Cozaar 25 mg daily . Says that his lower extremity swelling has improved. Has noticed a slight improvement in shortness of breath. However, continues to get short of breath with minimal activity. He is down on oxygen 4 L at rest and 6 L with activity. Patient denies any hemoptysis, orthopnea, PND, or chest pain. Patient is a former smoker , Pulmonary  function test is pending Labs last visit showed BNP was decreased to 206. Sedimentation rate was normal  Chest x-ray showed no significant change in small to moderate bilateral pleural effusions with persistent multifocal pulmonary opacities in mid left lung.     02/07/2014 f/u ov/Pearlie Nies re: chronic resp failure / GOLD III copd / cor pulmonale  Chief Complaint  Patient presents with  . Followup with PFT    Pt reports his breathing is doing well and denies any new co's today.    Not limited by breathing from desired activities  As long as on 02 2lpm / goal is to get off 02 completely  No obvious day to day or daytime variabilty or assoc chronic cough or cp or chest tightness, subjective wheeze overt sinus or hb symptoms. No unusual exp hx or h/o childhood pna/ asthma or knowledge of premature birth.  Sleeping ok without nocturnal  or early am exacerbation  of respiratory  c/o's or need for noct saba. Also denies any obvious fluctuation of symptoms with weather or environmental changes or other aggravating or alleviating factors except as outlined above   Current Medications, Allergies, Complete Past Medical History, Past Surgical History, Family History, and Social History were reviewed in [employer] record.  ROS  The following are not active complaints unless bolded sore throat,  dysphagia, dental problems, itching, sneezing,  nasal congestion or excess/ purulent secretions, ear ache,   fever, chills, sweats, unintended wt loss, pleuritic or exertional cp, hemoptysis,  orthopnea pnd or leg swelling better , presyncope, palpitations, heartburn, abdominal pain, anorexia, nausea, vomiting, diarrhea  or change in bowel or urinary habits, change in stools or urine, dysuria,hematuria,  rash, arthralgias, visual complaints, headache, numbness weakness or ataxia or problems with walking or coordination,  change in mood/affect or memory.            Objective:   Physical Exam  amb wm nad   12/21/2013 143>141 01/04/2014  > 02/07/2014  135     HEENT mild turbinate edema.  Oropharynx no thrush or excess pnd or cobblestoning.  No JVD or cervical adenopathy. Mild accessory muscle hypertrophy. Trachea midline, nl thryroid. Chest was hyperinflated by percussion with diminished breath sounds and moderate increased exp time without wheeze. Dullness both bases. Hoover sign positive at mid inspiration. Regular rate and rhythm without murmur gallop or rub or increase P2 and no sign ext lower edema.  Abd: no  hsm, nl excursion. Ext warm without cyanosis or clubbing.        CXR  12/21/2013 :  1. Persistent bilateral pleural effusions. 2. No change in aeration to the lungs.  Labs 12/21/2013  Esr 16,  hc03 36  Lab Results  Component Value Date   PROBNP 206.0* 12/21/2013          Assessment & Plan:

## 2014-02-11 LAB — PULMONARY FUNCTION TEST
DL/VA % pred: 46 %
DL/VA: 2.05 ml/min/mmHg/L
DLCO UNC % PRED: 30 %
DLCO unc: 8.54 ml/min/mmHg
FEF 25-75 Post: 0.3 L/sec
FEF 25-75 Pre: 0.26 L/sec
FEF2575-%Change-Post: 13 %
FEF2575-%PRED-POST: 22 %
FEF2575-%Pred-Pre: 19 %
FEV1-%CHANGE-POST: 4 %
FEV1-%PRED-POST: 33 %
FEV1-%Pred-Pre: 31 %
FEV1-Post: 0.73 L
FEV1-Pre: 0.69 L
FEV1FVC-%Change-Post: -4 %
FEV1FVC-%Pred-Pre: 58 %
FEV6-%Change-Post: 9 %
FEV6-%PRED-PRE: 53 %
FEV6-%Pred-Post: 58 %
FEV6-Post: 1.73 L
FEV6-Pre: 1.58 L
FEV6FVC-%CHANGE-POST: 0 %
FEV6FVC-%PRED-POST: 101 %
FEV6FVC-%PRED-PRE: 101 %
FVC-%Change-Post: 9 %
FVC-%PRED-PRE: 52 %
FVC-%Pred-Post: 57 %
FVC-PRE: 1.7 L
FVC-Post: 1.86 L
PRE FEV6/FVC RATIO: 93 %
Post FEV1/FVC ratio: 39 %
Post FEV6/FVC ratio: 93 %
Pre FEV1/FVC ratio: 41 %
RV % pred: 271 %
RV: 7.17 L
TLC % pred: 143 %
TLC: 9.28 L

## 2014-02-20 ENCOUNTER — Ambulatory Visit (INDEPENDENT_AMBULATORY_CARE_PROVIDER_SITE_OTHER): Payer: Medicare Other | Admitting: *Deleted

## 2014-02-20 DIAGNOSIS — I4891 Unspecified atrial fibrillation: Secondary | ICD-10-CM

## 2014-02-20 DIAGNOSIS — Z5181 Encounter for therapeutic drug level monitoring: Secondary | ICD-10-CM

## 2014-02-20 LAB — POCT INR: INR: 1.8

## 2014-03-02 ENCOUNTER — Encounter: Payer: Self-pay | Admitting: Internal Medicine

## 2014-03-02 ENCOUNTER — Ambulatory Visit (INDEPENDENT_AMBULATORY_CARE_PROVIDER_SITE_OTHER): Payer: Medicare Other | Admitting: Internal Medicine

## 2014-03-02 ENCOUNTER — Ambulatory Visit (INDEPENDENT_AMBULATORY_CARE_PROVIDER_SITE_OTHER)
Admission: RE | Admit: 2014-03-02 | Discharge: 2014-03-02 | Disposition: A | Discharge status: Home / Self Care | Payer: Medicare Other | Source: Ambulatory Visit | Attending: Internal Medicine | Admitting: Internal Medicine

## 2014-03-02 VITALS — BP 120/62 | HR 108 | Temp 98.1°F | Ht 67.0 in | Wt 137.2 lb

## 2014-03-02 DIAGNOSIS — J449 Chronic obstructive pulmonary disease, unspecified: Secondary | ICD-10-CM

## 2014-03-02 DIAGNOSIS — J9610 Chronic respiratory failure, unspecified whether with hypoxia or hypercapnia: Secondary | ICD-10-CM

## 2014-03-02 MED ORDER — PREDNISONE 10 MG PO TABS: ORAL_TABLET | ORAL | Status: DC

## 2014-03-02 MED ORDER — ALBUTEROL SULFATE HFA 108 (90 BASE) MCG/ACT IN AERS: INHALATION_SPRAY | RESPIRATORY_TRACT | Status: DC

## 2014-03-02 NOTE — Patient Instructions (Addendum)
Prednisone 10 mg take  4 each am x 2 days,   2 each am x 2 days,  1 each am x 2 days and stop   Increase 02 to 4lpm for now  As per calendar  Stop anoro Start spiriva 2 puffs each am Start Striverdi  2 puffs each am  Only use your albuterol (proair)as a rescue medication to be used if you can't catch your breath by resting or doing a relaxed purse lip breathing pattern.  - The less you use it, the better it will work when you need it. - Ok to use up to 2 puffs  every 4 hours if you must but call for immediate appointment if use goes up over your usual need - Don't leave home without it !!  (think of it like the spare tire for your car)   Please remember to go to the   x-ray department downstairs for your tests - we will call you with the results when they are available.     Please schedule a follow up office visit in 4 weeks, sooner if needed with all inhalers in hand to see Tammy Np for new med calendar

## 2014-03-02 NOTE — Progress Notes (Signed)
Subjective:    Patient ID: Logan [name], male    DOB: 1925/04/18   MRN: [id number]    Brief patient profile:  3 yowm quit smoking 1992 referred 11/24/2013 by Dr Arelia Sneddon to pulmonary clinic for eval of new resp failure     History of Present Illness  Date of Admission: 10/30/2013 Date of Discharge: 11/04/13 Community-acquired-pneumonia Acute respiratory failure Afib w/ RVR HFpEF Sepsis HTN Hypothyroidism Constipation   Brief Hospital Course: He was admitted on 10/30/2013 for acute respiratory distress. His PMHx is significant for CHF with dilated cardiomyopathy, AFib HTN, HLD, Hypothyroidism, solitary pulmonary nodule and likely COPD by CXR. Flu was negative and he was treated for CAP with Vanc and Primaxin for 4 days due to hypoxia and new oxygen requirement. Cardiology was consulted to help manage his Afib (permanent) with RVR; Diltiazem drip was utilized, and switched back to Metoprolol with improvement. His antibiotics were narrowed to Levaquin once clinically improving. He was admitted with a new oxygen requirement, failed BiPAP due to worsening hypoxia, and treated with [place] O2 @ 5L which was unable to be weaned. Requiring 4L continuous at discharge .   CAP w/ Acute respiratory failure: Admitted to stepdown. Treated with Vanc and Primaxin x 4 days, then switched to Levaquin. New oxygen requirement; 4L Cardiac (Afib w/ RVR, HFpEF, Hypotension): Managed with diltiazem drip initially, and switched back to Metoprolol at decreased dose. ECHO showed EF 50-55%.   HTN: Lasix and Valsartan held for initial hypotension that resolved. Not restarted on d/c; BP well controlled @ 142/81 on discharge.   PT/OT: Established Home health. Given DME: shower chair, walker , portable O2    ECHO 11/02/13 Study Conclusions  - Left ventricle: The cavity size was normal. Wall thickness was normal. Systolic function was normal. The estimated ejection fraction was in the range of 50% to 55%. Although no  diagnostic regional wall motion abnormality was identified, this possibility cannot be completely excluded on the basis of this study. The study is not technically sufficient to allow evaluation of LV diastolic function. - Aortic valve: Mild regurgitation. - Right ventricle: The cavity size was mildly dilated. Wall thickness was normal. Systolic function was mildly reduced. - Right atrium: The atrium was moderately dilated. - Pulmonary arteries: Systolic pressure was moderately increased. PA peak pressure: 76m Hg (S).     11/24/2013 post hosp initial office eval ov/Logan [name] re: acute resp failure  / cor pulmonale   Prior to admit baseline = Doe x one year "when over do it" even going to mailbox and back sometimes   Not necessarily related to coughing. At Most could  do [employer], yard work= some weed eating and trimming  rec Stop all oil based vitamins and replace with powder if available Eat more fish - especially salmon  Please remember to go to the  x-ray department downstairs for your tests - we will call you with the results when they are available. Please schedule a follow up office visit in 4 weeks, sooner if needed - stay on same amount of oxygen in meantime  = 4lpm   12/21/2013 f/u ov/Logan [name] re: new resp failure ? Etiology  Chief Complaint  Patient presents with  . Follow-up    Breathing is worse since last visit- esp in the am, has diff with getting dressed. Cough is some better. Sats 79%2lpm cont. at rest--increased to 74%4lpm cont.    no orthopnea but sob when stirring around, comfortable at rest  Started on cozaar and lasix  x 4 days prior to OV  By Dr Arelia Sneddon with progressive bilateral lower ext swelling but no better yet. Not wearing 02 as rec, just leaves on 2lpm due to small portable tank >>Increase your losartan to one whole and also lasix to one whole daily , 2 lasix first day x 1 , BNP decreased at 206.  O2 , 24/7, 4l/ rest, 6 l act    01/04/2014 Follow up and Med review   Patient returns for a followup and medication review. We reviewed all his medications and organized them into a medication calendar. Last visit. Patient was with increased lower shotty swelling. He was increased to Lasix to 40 mg daily, along with Cozaar 25 mg daily . Says that his lower extremity swelling has improved. Has noticed a slight improvement in shortness of breath. However, continues to get short of breath with minimal activity. He is down on oxygen 4 L at rest and 6 L with activity. Patient denies any hemoptysis, orthopnea, PND, or chest pain. Patient is a former smoker , Pulmonary  function test is pending Labs last visit showed BNP was decreased to 206. Sedimentation rate was normal  Chest x-ray showed no significant change in small to moderate bilateral pleural effusions with persistent multifocal pulmonary opacities in mid left lung.     02/07/2014 f/u ov/Logan [name] re: chronic resp failure / GOLD III copd / cor pulmonale  Chief Complaint  Patient presents with  . Followup with PFT    Pt reports his breathing is doing well and denies any new co's today.   Not limited by breathing from desired activities  As long as on 02 2lpm / goal is to get off 02 completely rec Start Anoro take 2 puffs off one click each am Ok to leave off 02 at rest but wear it at 2lpm with acitivity and at bedtime Separating the top medications from the bottom group is fundamental to providing you adequate care going forward.      03/02/2014 f/u ov/Logan [name] re:  GOLD III  ? aecopd  Chief Complaint  Patient presents with  . Acute Visit    Pt reports having increased cough and DOE for the past 10 days. He states that cough is occ prod with clear sputum.  He states that he was out of breath today walking from parking lot to door.      Not using action plan at bottom of calendar effectively. Does not have a saba Changed back to 02 24/7 at 3lpm   No obvious day to day or daytime variabilty or assoc  cp or chest  tightness, subjective wheeze overt sinus or hb symptoms. No unusual exp hx or h/o childhood pna/ asthma or knowledge of premature birth.  Sleeping ok without nocturnal  or early am exacerbation  of respiratory  c/o's or need for noct saba. Also denies any obvious fluctuation of symptoms with weather or environmental changes or other aggravating or alleviating factors except as outlined above   Current Medications, Allergies, Complete Past Medical History, Past Surgical History, Family History, and Social History were reviewed in [employer] record.  ROS  The following are not active complaints unless bolded sore throat, dysphagia, dental problems, itching, sneezing,  nasal congestion or excess/ purulent secretions, ear ache,   fever, chills, sweats, unintended wt loss, pleuritic or exertional cp, hemoptysis,  orthopnea pnd or leg swelling ? slt worse , presyncope, palpitations, heartburn, abdominal pain, anorexia, nausea, vomiting, diarrhea  or change in bowel  or urinary habits, change in stools or urine, dysuria,hematuria,  rash, arthralgias, visual complaints, headache, numbness weakness or ataxia or problems with walking or coordination,  change in mood/affect or memory.            Objective:   Physical Exam  amb wm nad   12/21/2013 143>141 01/04/2014  > 02/07/2014  135 > 03/02/2014 137     HEENT mild turbinate edema.  Oropharynx no thrush or excess pnd or cobblestoning.  No JVD or cervical adenopathy. Mild accessory muscle hypertrophy. Trachea midline, nl thryroid. Chest was hyperinflated by percussion with diminished breath sounds and moderate increased exp time without wheeze. Dullness both bases. Hoover sign positive at mid inspiration. Regular rate and rhythm without murmur gallop or rub or increase P2 and no sign ext lower edema.  Abd: no hsm, nl excursion. Ext warm without cyanosis or clubbing.         Labs 12/21/2013  Esr 16,  hc03 36  Lab Results  Component  Value Date   PROBNP 206.0* 12/21/2013     cxr 03/02/14  Unchanged small bilateral effusions with slight worsening of bilateral mid and lower lung opacities, possibly atelectasis though worrisome for edema superimposed on advanced emphysematous change. Continued attention on follow-up is recommended      Assessment & Plan:

## 2014-03-03 NOTE — Assessment & Plan Note (Signed)
-   PFT's 02/07/14  FEV1  0.69 (31%) ratio 41 no change p B2 and DLCO 30% - Trial of anoro 02/07/2014 > no better  DDX of  difficult airways managment all start with A and  include Adherence, Ace Inhibitors, Acid Reflux, Active Sinus Disease, Alpha 1 Antitripsin deficiency, Anxiety masquerading as Airways dz,  ABPA,  allergy(esp in young), Aspiration (esp in elderly), Adverse effects of DPI,  Active smokers, plus two Bs  = Bronchiectasis and Beta blocker use..and one C= CHF  Adherence is always the initial "prime suspect" and is a multilayered concern that requires a "trust but verify" approach in every patient - starting with knowing how to use medications, especially inhalers, correctly, keeping up with refills and understanding the fundamental difference between maintenance and prns vs those medications only taken for a very short course and then stopped and not refilled.  - reviewed how to use the med calendar more effectively -The proper method of use, as well as anticipated side effects, of a metered-dose inhaler are discussed and demonstrated to the patient. Improved effectiveness after extensive coaching during this visit to a level of approximately  75% so try spriva/striverdi next  ? Adverse effect of dpi > stop anoro  ? Allergy/ asthma > Prednisone 10 mg take  4 each am x 2 days,   2 each am x 2 days,  1 each am x 2 days and stop  ? Chf > double lasix x 3 days then regroup     Each maintenance medication was reviewed in detail including most importantly the difference between maintenance and as needed and under what circumstances the prns are to be used. This was done in the context of a medication calendar review which provided the patient with a user-friendly unambiguous mechanism for medication administration and reconciliation and provides an action plan for all active problems. It is critical that this be shown to every doctor  for modification during the office visit if necessary so the  patient can use it as a working document.

## 2014-03-03 NOTE — Assessment & Plan Note (Signed)
-   as of 12/21/2013 rec 4lpm 247 except fo 6 lpm with activity  - 02/07/2014 93% RA so rec 02 2lpm at hs and with activity - 03/02/2014 2 lpm 84% > corrected on 4lpm  rx as of 03/02/14 > 4lpm 24/7

## 2014-03-03 NOTE — Progress Notes (Signed)
Quick Note:  Spoke with pt and notified of results per Dr. Melvyn Novas. Pt verbalized understanding and denied any questions. Appt with MW 03/06/14 ______

## 2014-03-06 ENCOUNTER — Ambulatory Visit (INDEPENDENT_AMBULATORY_CARE_PROVIDER_SITE_OTHER): Payer: Medicare Other | Admitting: Internal Medicine

## 2014-03-06 ENCOUNTER — Ambulatory Visit (INDEPENDENT_AMBULATORY_CARE_PROVIDER_SITE_OTHER): Payer: Medicare Other | Admitting: Pharmacist

## 2014-03-06 ENCOUNTER — Other Ambulatory Visit (INDEPENDENT_AMBULATORY_CARE_PROVIDER_SITE_OTHER): Payer: Medicare Other

## 2014-03-06 ENCOUNTER — Encounter: Payer: Self-pay | Admitting: Internal Medicine

## 2014-03-06 ENCOUNTER — Ambulatory Visit: Payer: Medicare Other | Admitting: Internal Medicine

## 2014-03-06 VITALS — BP 108/60 | HR 102 | Temp 98.1°F | Ht 67.0 in | Wt 135.4 lb

## 2014-03-06 DIAGNOSIS — R0600 Dyspnea, unspecified: Secondary | ICD-10-CM

## 2014-03-06 DIAGNOSIS — R0609 Other forms of dyspnea: Secondary | ICD-10-CM

## 2014-03-06 DIAGNOSIS — J9610 Chronic respiratory failure, unspecified whether with hypoxia or hypercapnia: Secondary | ICD-10-CM

## 2014-03-06 DIAGNOSIS — I4891 Unspecified atrial fibrillation: Secondary | ICD-10-CM

## 2014-03-06 DIAGNOSIS — Z5181 Encounter for therapeutic drug level monitoring: Secondary | ICD-10-CM

## 2014-03-06 DIAGNOSIS — R0989 Other specified symptoms and signs involving the circulatory and respiratory systems: Secondary | ICD-10-CM

## 2014-03-06 DIAGNOSIS — J90 Pleural effusion, not elsewhere classified: Secondary | ICD-10-CM

## 2014-03-06 DIAGNOSIS — J449 Chronic obstructive pulmonary disease, unspecified: Secondary | ICD-10-CM

## 2014-03-06 LAB — BRAIN NATRIURETIC PEPTIDE: Pro B Natriuretic peptide (BNP): 164 pg/mL — ABNORMAL HIGH (ref 0.0–100.0)

## 2014-03-06 LAB — CBC WITH DIFFERENTIAL/PLATELET
BASOS PCT: 0.1 % (ref 0.0–3.0)
Basophils Absolute: 0 10*3/uL (ref 0.0–0.1)
EOS ABS: 0 10*3/uL (ref 0.0–0.7)
Eosinophils Relative: 0.1 % (ref 0.0–5.0)
HCT: 41.6 % (ref 39.0–52.0)
Hemoglobin: 13.5 g/dL (ref 13.0–17.0)
Lymphs Abs: 0.6 10*3/uL — ABNORMAL LOW (ref 0.7–4.0)
MCHC: 32.4 g/dL (ref 30.0–36.0)
MCV: 96.3 fl (ref 78.0–100.0)
Monocytes Absolute: 0.1 10*3/uL (ref 0.1–1.0)
Monocytes Relative: 0.5 % — ABNORMAL LOW (ref 3.0–12.0)
Neutro Abs: 14.7 10*3/uL — ABNORMAL HIGH (ref 1.4–7.7)
Platelets: 398 10*3/uL (ref 150.0–400.0)
RBC: 4.32 Mil/uL (ref 4.22–5.81)
RDW: 14.8 % (ref 11.5–15.5)
WBC: 15.4 10*3/uL — ABNORMAL HIGH (ref 4.0–10.5)

## 2014-03-06 LAB — BASIC METABOLIC PANEL
BUN: 23 mg/dL (ref 6–23)
CALCIUM: 9 mg/dL (ref 8.4–10.5)
CO2: 36 mEq/L — ABNORMAL HIGH (ref 19–32)
Chloride: 97 mEq/L (ref 96–112)
Creatinine, Ser: 1.1 mg/dL (ref 0.4–1.5)
GFR: 68.69 mL/min (ref >60.00)
Glucose, Bld: 204 mg/dL — ABNORMAL HIGH (ref 70–99)
Potassium: 4 mEq/L (ref 3.5–5.1)
Sodium: 142 mEq/L (ref 135–145)

## 2014-03-06 LAB — TSH: TSH: 0.81 u[IU]/mL (ref 0.35–4.50)

## 2014-03-06 LAB — SEDIMENTATION RATE: SED RATE: 33 mm/h — AB (ref 0–22)

## 2014-03-06 LAB — POCT INR: INR: 5.9

## 2014-03-06 NOTE — Assessment & Plan Note (Signed)
-   PFT's 02/07/14  FEV1  0.69 (31%) ratio 41 no change p B2 and DLCO 30% - Trial of anoro 02/07/2014 > no better - trial of spiriva/ striverdi 03/02/2014 > improved so no change rx

## 2014-03-06 NOTE — Assessment & Plan Note (Addendum)
-   as of 12/21/2013 rec 4lpm 247 except fo 6 lpm with activity  - 02/07/2014 93% RA so rec 02 2lpm at hs and with activity - 03/02/2014 2 lpm 84% > corrected on 4lpm - 03/06/2014 HC03 36 c/w chronic hypercarbia as well  rx as of 03/02/14 > 4lpm 24/7   Adequate control on present rx, reviewed > no change in rx needed

## 2014-03-06 NOTE — Progress Notes (Signed)
Subjective:    Patient ID: Logan [name], male    DOB: 1925/04/18   MRN: [id number]    Brief patient profile:  3 yowm quit smoking 1992 referred 11/24/2013 by Dr Arelia Sneddon to pulmonary clinic for eval of new resp failure     History of Present Illness  Date of Admission: 10/30/2013 Date of Discharge: 11/04/13 Community-acquired-pneumonia Acute respiratory failure Afib w/ RVR HFpEF Sepsis HTN Hypothyroidism Constipation   Brief Hospital Course: He was admitted on 10/30/2013 for acute respiratory distress. His PMHx is significant for CHF with dilated cardiomyopathy, AFib HTN, HLD, Hypothyroidism, solitary pulmonary nodule and likely COPD by CXR. Flu was negative and he was treated for CAP with Vanc and Primaxin for 4 days due to hypoxia and new oxygen requirement. Cardiology was consulted to help manage his Afib (permanent) with RVR; Diltiazem drip was utilized, and switched back to Metoprolol with improvement. His antibiotics were narrowed to Levaquin once clinically improving. He was admitted with a new oxygen requirement, failed BiPAP due to worsening hypoxia, and treated with [place] O2 @ 5L which was unable to be weaned. Requiring 4L continuous at discharge .   CAP w/ Acute respiratory failure: Admitted to stepdown. Treated with Vanc and Primaxin x 4 days, then switched to Levaquin. New oxygen requirement; 4L Cardiac (Afib w/ RVR, HFpEF, Hypotension): Managed with diltiazem drip initially, and switched back to Metoprolol at decreased dose. ECHO showed EF 50-55%.   HTN: Lasix and Valsartan held for initial hypotension that resolved. Not restarted on d/c; BP well controlled @ 142/81 on discharge.   PT/OT: Established Home health. Given DME: shower chair, walker , portable O2    ECHO 11/02/13 Study Conclusions  - Left ventricle: The cavity size was normal. Wall thickness was normal. Systolic function was normal. The estimated ejection fraction was in the range of 50% to 55%. Although no  diagnostic regional wall motion abnormality was identified, this possibility cannot be completely excluded on the basis of this study. The study is not technically sufficient to allow evaluation of LV diastolic function. - Aortic valve: Mild regurgitation. - Right ventricle: The cavity size was mildly dilated. Wall thickness was normal. Systolic function was mildly reduced. - Right atrium: The atrium was moderately dilated. - Pulmonary arteries: Systolic pressure was moderately increased. PA peak pressure: 76m Hg (S).     11/24/2013 post hosp initial office eval ov/Wert re: acute resp failure  / cor pulmonale   Prior to admit baseline = Doe x one year "when over do it" even going to mailbox and back sometimes   Not necessarily related to coughing. At Most could  do [employer], yard work= some weed eating and trimming  rec Stop all oil based vitamins and replace with powder if available Eat more fish - especially salmon  Please remember to go to the  x-ray department downstairs for your tests - we will call you with the results when they are available. Please schedule a follow up office visit in 4 weeks, sooner if needed - stay on same amount of oxygen in meantime  = 4lpm   12/21/2013 f/u ov/Wert re: new resp failure ? Etiology  Chief Complaint  Patient presents with  . Follow-up    Breathing is worse since last visit- esp in the am, has diff with getting dressed. Cough is some better. Sats 79%2lpm cont. at rest--increased to 74%4lpm cont.    no orthopnea but sob when stirring around, comfortable at rest  Started on cozaar and lasix  x 4 days prior to OV  By Dr Arelia Sneddon with progressive bilateral lower ext swelling but no better yet. Not wearing 02 as rec, just leaves on 2lpm due to small portable tank >>Increase your losartan to one whole and also lasix to one whole daily , 2 lasix first day x 1 , BNP decreased at 206.  O2 , 24/7, 4l/ rest, 6 l act    01/04/2014 Follow up and Med review   Patient returns for a followup and medication review. We reviewed all his medications and organized them into a medication calendar. Last visit. Patient was with increased lower shotty swelling. He was increased to Lasix to 40 mg daily, along with Cozaar 25 mg daily . Says that his lower extremity swelling has improved. Has noticed a slight improvement in shortness of breath. However, continues to get short of breath with minimal activity. He is down on oxygen 4 L at rest and 6 L with activity. Patient denies any hemoptysis, orthopnea, PND, or chest pain. Patient is a former smoker , Pulmonary  function test is pending Labs last visit showed BNP was decreased to 206. Sedimentation rate was normal  Chest x-ray showed no significant change in small to moderate bilateral pleural effusions with persistent multifocal pulmonary opacities in mid left lung.     02/07/2014 f/u ov/Wert re: chronic resp failure / GOLD III copd / cor pulmonale  Chief Complaint  Patient presents with  . Followup with PFT    Pt reports his breathing is doing well and denies any new co's today.   Not limited by breathing from desired activities  As long as on 02 2lpm / goal is to get off 02 completely rec Start Anoro take 2 puffs off one click each am Ok to leave off 02 at rest but wear it at 2lpm with acitivity and at bedtime Separating the top medications from the bottom group is fundamental to providing you adequate care going forward.      03/02/2014 f/u ov/Wert re:  GOLD III  ? aecopd  Chief Complaint  Patient presents with  . Acute Visit    Pt reports having increased cough and DOE for the past 10 days. He states that cough is occ prod with clear sputum.  He states that he was out of breath today walking from parking lot to door.     Not using action plan at bottom of calendar effectively. Does not have a saba Changed back to 02 24/7 at 3lpm  rec Prednisone 10 mg take  4 each am x 2 days,   2 each am x 2 days,   1 each am x 2 days and stop  Increase 02 to 4lpm for now As per calendar  Stop anoro Start spiriva 2 puffs each am Start Striverdi  2 puffs each am Only use your albuterol (proair)as a rescue medication Take extra furosemide 40 mg daily     03/06/2014 f/u ov/Wert re: chronic respiratory failure/ 4lpm 24/7  Chief Complaint  Patient presents with  . Follow-up    Pt states that his breathing is doing much better. No new co's today. Has not had to use rescue inhaler since the last visit.   still w/c bound but no resting sob, min cough s excess or purulent sputum  No obvious day to day or daytime variabilty or assoc  cp or chest tightness, subjective wheeze overt sinus or hb symptoms. No unusual exp hx or h/o childhood pna/ asthma or knowledge  of premature birth.  Sleeping ok without nocturnal  or early am exacerbation  of respiratory  c/o's or need for noct saba. Also denies any obvious fluctuation of symptoms with weather or environmental changes or other aggravating or alleviating factors except as outlined above   Current Medications, Allergies, Complete Past Medical History, Past Surgical History, Family History, and Social History were reviewed in [employer] record.  ROS  The following are not active complaints unless bolded sore throat, dysphagia, dental problems, itching, sneezing,  nasal congestion or excess/ purulent secretions, ear ache,   fever, chills, sweats, unintended wt loss, pleuritic or exertional cp, hemoptysis,  orthopnea pnd or leg swelling ? slt worse , presyncope, palpitations, heartburn, abdominal pain, anorexia, nausea, vomiting, diarrhea  or change in bowel or urinary habits, change in stools or urine, dysuria,hematuria,  rash, arthralgias, visual complaints, headache, numbness weakness or ataxia or problems with walking or coordination,  change in mood/affect or memory.            Objective:   Physical Exam  amb wm nad   12/21/2013  143>141 01/04/2014  > 02/07/2014  135 > 03/02/2014 137 > 03/06/2014  135     HEENT mild turbinate edema.  Oropharynx no thrush or excess pnd or cobblestoning.  No JVD or cervical adenopathy. Mild accessory muscle hypertrophy. Trachea midline, nl thryroid. Chest was hyperinflated by percussion with diminished breath sounds and moderate increased exp time with insp and exp rhonchi R > L . Dullness both bases. Hoover sign positive at mid inspiration. Regular rate and rhythm without murmur gallop or rub or increase P2 and 1+ sym lower edema.  Abd: no hsm, nl excursion. Ext warm without cyanosis or clubbing.              Lab Results  Component Value Date   ESRSEDRATE 33* 03/06/2014   ESRSEDRATE 16 12/21/2013     Lab Results  Component Value Date   PROBNP 164.0* 03/06/2014     Recent Labs Lab 03/06/14 1226  NA 142  K 4.0  CL 97  CO2 36*  BUN 23  CREATININE 1.1  GLUCOSE 204*    Recent Labs Lab 03/06/14 1226  HGB 13.5  HCT 41.6  WBC 15.4*  PLT 398.0        cxr 03/02/14  Unchanged small bilateral effusions with slight worsening of bilateral mid and lower lung opacities, possibly atelectasis though worrisome for edema superimposed on advanced emphysematous change. Continued attention on follow-up is recommended      Assessment & Plan:

## 2014-03-06 NOTE — Patient Instructions (Addendum)
For any  Fluid/swelling  at all  in legs > take any extra furosemide 40 mg daily   Please remember to go to the lab  department downstairs for your tests - we will call you with the results when they are available.    Keep your scheduled appointments

## 2014-03-06 NOTE — Assessment & Plan Note (Signed)
Multifactorial, improved on present rx

## 2014-03-06 NOTE — Assessment & Plan Note (Addendum)
-   Echo 11/02/13 - Left ventricle: The cavity size was normal. Wall thickness was normal. Systolic function was normal. The estimated ejection fraction was in the range of 50% to 55%. Although no diagnostic regional wall motion abnormality was identified, this possibility cannot be completely excluded on the basis of this study. The study is not technically sufficient to allow evaluation of LV diastolic function. - Aortic valve: Mild regurgitation. - Right ventricle: The cavity size was mildly dilated. Wall thickness was normal. Systolic function was mildly reduced. - Right atrium: The atrium was moderately dilated. - Pulmonary arteries: Systolic pressure was moderately increased. PA peak pressure: 58mm Hg (S). Transthoracic echocardiography. M-mode, complete   Lab Results  Component Value Date   PROBNP 164.0* 03/06/2014     Symptoms much Better with extra lasix c/w vol overload and reflected in peripheral edema , rec exta dose prn leg swelling

## 2014-03-07 NOTE — Progress Notes (Signed)
Quick Note:  Spoke with pt and notified of results per Dr. Wert. Pt verbalized understanding and denied any questions.  ______ 

## 2014-03-10 ENCOUNTER — Ambulatory Visit (INDEPENDENT_AMBULATORY_CARE_PROVIDER_SITE_OTHER): Payer: Medicare Other

## 2014-03-10 DIAGNOSIS — I4891 Unspecified atrial fibrillation: Secondary | ICD-10-CM

## 2014-03-10 DIAGNOSIS — Z5181 Encounter for therapeutic drug level monitoring: Secondary | ICD-10-CM

## 2014-03-10 LAB — POCT INR: INR: 1.9

## 2014-03-20 ENCOUNTER — Other Ambulatory Visit: Payer: Self-pay | Admitting: Cardiology

## 2014-03-27 ENCOUNTER — Ambulatory Visit (INDEPENDENT_AMBULATORY_CARE_PROVIDER_SITE_OTHER): Payer: Medicare Other | Admitting: Pharmacist

## 2014-03-27 ENCOUNTER — Ambulatory Visit (INDEPENDENT_AMBULATORY_CARE_PROVIDER_SITE_OTHER): Payer: Medicare Other | Admitting: Cardiology

## 2014-03-27 ENCOUNTER — Encounter: Payer: Self-pay | Admitting: Cardiology

## 2014-03-27 VITALS — BP 102/60 | HR 80 | Ht 67.0 in | Wt 127.8 lb

## 2014-03-27 DIAGNOSIS — Z5181 Encounter for therapeutic drug level monitoring: Secondary | ICD-10-CM

## 2014-03-27 DIAGNOSIS — I5033 Acute on chronic diastolic (congestive) heart failure: Secondary | ICD-10-CM

## 2014-03-27 DIAGNOSIS — Z7901 Long term (current) use of anticoagulants: Secondary | ICD-10-CM

## 2014-03-27 DIAGNOSIS — I4891 Unspecified atrial fibrillation: Secondary | ICD-10-CM

## 2014-03-27 DIAGNOSIS — J9610 Chronic respiratory failure, unspecified whether with hypoxia or hypercapnia: Secondary | ICD-10-CM

## 2014-03-27 DIAGNOSIS — I482 Chronic atrial fibrillation, unspecified: Secondary | ICD-10-CM

## 2014-03-27 DIAGNOSIS — I509 Heart failure, unspecified: Secondary | ICD-10-CM

## 2014-03-27 LAB — POCT INR: INR: 2.6

## 2014-03-27 MED ORDER — FUROSEMIDE 40 MG PO TABS: 40.0000 mg | ORAL_TABLET | Freq: Two times a day (BID) | ORAL | Status: DC

## 2014-03-27 MED ORDER — SIMVASTATIN 80 MG PO TABS: 80.0000 mg | ORAL_TABLET | Freq: Every day | ORAL | Status: DC

## 2014-03-27 NOTE — Patient Instructions (Signed)
Continue your current therapy  Restrict salt intake.  I will see you in 3 months.

## 2014-03-27 NOTE — Progress Notes (Signed)
Logan [name] Date of Birth: 11-07-1924   History of Present Illness: [name] is seen for followup today. He has a history of permanent atrial fibrillation managed with rate control and anticoagulation. He also has chronic systolic heart failure with last ejection fraction of 55-60%. He was admitted to the hospital in January with community acquired PNA and respiratory failure. He also had increased ventricular response of AFib and diastolic heart failure. Since DC he has continued on home oxygen. Two months ago he complained of increasing dyspnea and CXR showed evidence of volume overload. With increase in diuretic therapy his breathing is much better. He has lost 8 lbs. His edema has improved. He still has a nocturnal cough. He is now off inhalers and steroids.  Current Outpatient Prescriptions on File Prior to Visit  Medication Sig Dispense Refill  . Calcium-Magnesium-Zinc (CAL-MAG-ZINC PO) Take 1 tablet by mouth daily.       Marland Kitchen dextromethorphan-guaiFENesin (MUCINEX DM) 30-600 MG per 12 hr tablet Take 1-2 tablets by mouth every 12 (twelve) hours as needed (cough, congestion).      Marland Kitchen digoxin (LANOXIN) 0.125 MG tablet Take 1 tablet (0.125 mg total) by mouth daily.  30 tablet  11  . levothyroxine (SYNTHROID, LEVOTHROID) 75 MCG tablet Take 75 mcg by mouth daily before breakfast.      . losartan (COZAAR) 25 MG tablet Take 25 mg by mouth daily.       . metoprolol tartrate (LOPRESSOR) 25 MG tablet Take 1 tablet (25 mg total) by mouth 2 (two) times daily.  60 tablet  0  . Multiple Vitamin (MULTIVITAMIN) tablet Take 1 tablet by mouth daily.       . tamsulosin (FLOMAX) 0.4 MG CAPS capsule Take 0.4 mg by mouth.      . warfarin (COUMADIN) 3 MG tablet 7.5mg s daily except 5mg s on Mondays, Wednesdays and Fridays or as directed by coumadin clinic  135 tablet  1   No current facility-administered medications on file prior to visit.    No Known Allergies  Past Medical History  Diagnosis Date  .  Chronic atrial fibrillation   . Dilated cardiomyopathy   . PVC's (premature ventricular contractions)   . Hyperlipidemia   . COPD (chronic obstructive pulmonary disease)   . Hypothyroidism   . Renal calculi   . Chronic anticoagulation   . CHF (congestive heart failure)     DUE TO SYSTOLIC DYSFUNCTION  . Shortness of breath     exertional  . Cancer     vocal cord - radiation     Past Surgical History  Procedure Laterality Date  . Cardiac catheterization  02/26/2007    EF 35-40%  . Appendectomy    . Tonsillectomy and adenoidectomy    . US echocardiography  07/08/2010    EF 50-55%  . US echocardiography  02/16/2007    EF 25-35%  . Transthoracic echocardiogram  08/26/2001    EF 25-30%  . Cardiovascular stress test  02/18/2007    EF 40%  . Microlaryngoscopy with co2 laser and excision of vocal cord lesion      15 years ago  . Lithotripsy    . Inguinal hernia repair Right 12/28/2012    Procedure: HERNIA REPAIR INGUINAL ADULT;  Surgeon: Joyice Faster. Cornett, MD;  Location: [facility];  Service: General;  Laterality: Right;    History  Smoking status  . Former Smoker -- 1.00 packs/day for 35 years  . Types: Cigarettes  . Quit date: 07/12/1991  Smokeless  tobacco  . Not on file    History  Alcohol Use No    Family History  Problem Relation Age of Onset  . Stroke Mother   . Seizures Mother   . Heart attack Father   . Heart failure Father   . Heart attack Brother     Review of Systems: As per history of present illness.  All other systems were reviewed and are negative.  Physical Exam: BP 102/60  Pulse 80  Ht 5\' 7"  (1.702 m)  Wt 127 lb 12.8 oz (57.97 kg)  BMI 20.01 kg/m2  SpO2 95% He is a pleasant white male in no acute distress. He is wearing oxygen.The HEENT exam is normal. The carotids are 2+ without bruits.  There is no thyromegaly.  There is mild JVD.  The lungs reveal diminished BS throughout. No rales.  The heart exam reveals an irregular rate with a normal S1 and S2.   There are no murmurs, gallops, or rubs.  The PMI is not displaced.   Abdominal exam reveals good bowel sounds.  There is no hepatosplenomegaly or tenderness.    Exam of the legs 1+ left pretibial edema. No edema on the right. Pedal pulses are intact.  Cranial nerves II - XII are intact.  Motor and sensory functions are intact.  The gait is normal.  LABORATORY DATA: Lab Results  Component Value Date   WBC 15.4* 03/06/2014   HGB 13.5 03/06/2014   HCT 41.6 03/06/2014   PLT 398.0 03/06/2014   GLUCOSE 204* 03/06/2014   CHOL 136 10/31/2013   TRIG 77 10/31/2013   HDL 50 10/31/2013   LDLCALC 71 10/31/2013   ALT 25 11/02/2013   AST 29 11/02/2013   NA 142 03/06/2014   K 4.0 03/06/2014   CL 97 03/06/2014   CREATININE 1.1 03/06/2014   BUN 23 03/06/2014   CO2 36* 03/06/2014   TSH 0.81 03/06/2014   INR 2.6 03/27/2014   Echo: 11/02/13:Study Conclusions  - Left ventricle: The cavity size was normal. Wall thickness was normal. Systolic function was normal. The estimated ejection fraction was in the range of 50% to 55%. Although no diagnostic regional wall motion abnormality was identified, this possibility cannot be completely excluded on the basis of this study. The study is not technically sufficient to allow evaluation of LV diastolic function. - Aortic valve: Mild regurgitation. - Right ventricle: The cavity size was mildly dilated. Wall thickness was normal. Systolic function was mildly reduced. - Right atrium: The atrium was moderately dilated. - Pulmonary arteries: Systolic pressure was moderately increased. PA peak pressure: 92mm Hg (S).  Lab Results  Component Value Date   WBC 15.4* 03/06/2014   HGB 13.5 03/06/2014   HCT 41.6 03/06/2014   PLT 398.0 03/06/2014   GLUCOSE 204* 03/06/2014   CHOL 136 10/31/2013   TRIG 77 10/31/2013   HDL 50 10/31/2013   LDLCALC 71 10/31/2013   ALT 25 11/02/2013   AST 29 11/02/2013   NA 142 03/06/2014   K 4.0 03/06/2014   CL 97 03/06/2014   CREATININE 1.1 03/06/2014   BUN  23 03/06/2014   CO2 36* 03/06/2014   TSH 0.81 03/06/2014   INR 2.6 03/27/2014   BNP 164   Assessment / Plan: 1. Atrial fibrillation, permanent. Rate is well controlled today. We will continue on his current dose of metoprolol and digoxin. Continue anticoagulation with Coumadin. INR is therapeutic.   2. History of dilated cardiomyopathy. In 2008 ejection fraction was 25-35%. Repeat  echocardiogram in Jan showed EF of 50-55%. He will continue with metoprolol and ARB.   3. Acute on chronic diastolic CHF. Improved with diuresis. Needs to continue lasix 40 mg bid.   4. Hyperlipidemia. Continue simvastatin and fish oil.  5. Acute on chronic respiratory failure s/p PNA in January. Pulmonary HTN by Echo. Continue oxygen therapy.

## 2014-03-30 ENCOUNTER — Encounter: Payer: Medicare Other | Admitting: Adult Health

## 2014-03-31 ENCOUNTER — Encounter: Payer: Medicare Other | Admitting: Adult Health

## 2014-04-04 ENCOUNTER — Ambulatory Visit (INDEPENDENT_AMBULATORY_CARE_PROVIDER_SITE_OTHER): Payer: Medicare Other | Admitting: Adult Health

## 2014-04-04 ENCOUNTER — Encounter: Payer: Self-pay | Admitting: Adult Health

## 2014-04-04 ENCOUNTER — Telehealth: Payer: Self-pay | Admitting: Adult Health

## 2014-04-04 VITALS — BP 110/64 | HR 66 | Temp 97.6°F | Ht 69.0 in | Wt 130.6 lb

## 2014-04-04 DIAGNOSIS — J449 Chronic obstructive pulmonary disease, unspecified: Secondary | ICD-10-CM

## 2014-04-04 MED ORDER — TIOTROPIUM BROMIDE MONOHYDRATE 2.5 MCG/ACT IN AERS: 2.0000 | INHALATION_SPRAY | Freq: Every day | RESPIRATORY_TRACT | Status: DC

## 2014-04-04 MED ORDER — OLODATEROL HCL 2.5 MCG/ACT IN AERS: 2.0000 | INHALATION_SPRAY | Freq: Every day | RESPIRATORY_TRACT | Status: DC

## 2014-04-04 NOTE — Progress Notes (Signed)
Subjective:    Patient ID: Logan [name], male    DOB: 1925/04/18   MRN: [id number]    Brief patient profile:  3 yowm quit smoking 1992 referred 11/24/2013 by Dr Arelia Sneddon to pulmonary clinic for eval of new resp failure     History of Present Illness  Date of Admission: 10/30/2013 Date of Discharge: 11/04/13 Community-acquired-pneumonia Acute respiratory failure Afib w/ RVR HFpEF Sepsis HTN Hypothyroidism Constipation   Brief Hospital Course: He was admitted on 10/30/2013 for acute respiratory distress. His PMHx is significant for CHF with dilated cardiomyopathy, AFib HTN, HLD, Hypothyroidism, solitary pulmonary nodule and likely COPD by CXR. Flu was negative and he was treated for CAP with Vanc and Primaxin for 4 days due to hypoxia and new oxygen requirement. Cardiology was consulted to help manage his Afib (permanent) with RVR; Diltiazem drip was utilized, and switched back to Metoprolol with improvement. His antibiotics were narrowed to Levaquin once clinically improving. He was admitted with a new oxygen requirement, failed BiPAP due to worsening hypoxia, and treated with [place] O2 @ 5L which was unable to be weaned. Requiring 4L continuous at discharge .   CAP w/ Acute respiratory failure: Admitted to stepdown. Treated with Vanc and Primaxin x 4 days, then switched to Levaquin. New oxygen requirement; 4L Cardiac (Afib w/ RVR, HFpEF, Hypotension): Managed with diltiazem drip initially, and switched back to Metoprolol at decreased dose. ECHO showed EF 50-55%.   HTN: Lasix and Valsartan held for initial hypotension that resolved. Not restarted on d/c; BP well controlled @ 142/81 on discharge.   PT/OT: Established Home health. Given DME: shower chair, walker , portable O2    ECHO 11/02/13 Study Conclusions  - Left ventricle: The cavity size was normal. Wall thickness was normal. Systolic function was normal. The estimated ejection fraction was in the range of 50% to 55%. Although no  diagnostic regional wall motion abnormality was identified, this possibility cannot be completely excluded on the basis of this study. The study is not technically sufficient to allow evaluation of LV diastolic function. - Aortic valve: Mild regurgitation. - Right ventricle: The cavity size was mildly dilated. Wall thickness was normal. Systolic function was mildly reduced. - Right atrium: The atrium was moderately dilated. - Pulmonary arteries: Systolic pressure was moderately increased. PA peak pressure: 76m Hg (S).     11/24/2013 post hosp initial office eval ov/Wert re: acute resp failure  / cor pulmonale   Prior to admit baseline = Doe x one year "when over do it" even going to mailbox and back sometimes   Not necessarily related to coughing. At Most could  do [employer], yard work= some weed eating and trimming  rec Stop all oil based vitamins and replace with powder if available Eat more fish - especially salmon  Please remember to go to the  x-ray department downstairs for your tests - we will call you with the results when they are available. Please schedule a follow up office visit in 4 weeks, sooner if needed - stay on same amount of oxygen in meantime  = 4lpm   12/21/2013 f/u ov/Wert re: new resp failure ? Etiology  Chief Complaint  Patient presents with  . Follow-up    Breathing is worse since last visit- esp in the am, has diff with getting dressed. Cough is some better. Sats 79%2lpm cont. at rest--increased to 74%4lpm cont.    no orthopnea but sob when stirring around, comfortable at rest  Started on cozaar and lasix  x 4 days prior to OV  By Dr Arelia Sneddon with progressive bilateral lower ext swelling but no better yet. Not wearing 02 as rec, just leaves on 2lpm due to small portable tank >>Increase your losartan to one whole and also lasix to one whole daily , 2 lasix first day x 1 , BNP decreased at 206.  O2 , 24/7, 4l/ rest, 6 l act    01/04/2014 Follow up and Med review   Patient returns for a followup and medication review. We reviewed all his medications and organized them into a medication calendar. Last visit. Patient was with increased lower shotty swelling. He was increased to Lasix to 40 mg daily, along with Cozaar 25 mg daily . Says that his lower extremity swelling has improved. Has noticed a slight improvement in shortness of breath. However, continues to get short of breath with minimal activity. He is down on oxygen 4 L at rest and 6 L with activity. Patient denies any hemoptysis, orthopnea, PND, or chest pain. Patient is a former smoker , Pulmonary  function test is pending Labs last visit showed BNP was decreased to 206. Sedimentation rate was normal  Chest x-ray showed no significant change in small to moderate bilateral pleural effusions with persistent multifocal pulmonary opacities in mid left lung.     02/07/2014 f/u ov/Wert re: chronic resp failure / GOLD III copd / cor pulmonale  Chief Complaint  Patient presents with  . Followup with PFT    Pt reports his breathing is doing well and denies any new co's today.   Not limited by breathing from desired activities  As long as on 02 2lpm / goal is to get off 02 completely rec Start Anoro take 2 puffs off one click each am Ok to leave off 02 at rest but wear it at 2lpm with acitivity and at bedtime Separating the top medications from the bottom group is fundamental to providing you adequate care going forward.      03/02/2014 f/u ov/Wert re:  GOLD III  ? aecopd  Chief Complaint  Patient presents with  . Acute Visit    Pt reports having increased cough and DOE for the past 10 days. He states that cough is occ prod with clear sputum.  He states that he was out of breath today walking from parking lot to door.     Not using action plan at bottom of calendar effectively. Does not have a saba Changed back to 02 24/7 at 3lpm  rec Prednisone 10 mg take  4 each am x 2 days,   2 each am x 2 days,   1 each am x 2 days and stop  Increase 02 to 4lpm for now As per calendar  Stop anoro Start spiriva 2 puffs each am Start Striverdi  2 puffs each am Only use your albuterol (proair)as a rescue medication Take extra furosemide 40 mg daily     03/06/2014 f/u ov/Wert re: chronic respiratory failure/ 4lpm 24/7  Chief Complaint  Patient presents with  . Follow-up    Pt states that his breathing is doing much better. No new co's today. Has not had to use rescue inhaler since the last visit.   still w/c bound but no resting sob, min cough s excess or purulent sputum >>extra lasix 40mg  daily    04/04/2014 Follow up and Med review  Pt returns for follow up and med review  Patient returns for a followup and medication review. We reviewed all  his medications and organized them into a medication calendar. It appears he has decreased his O2 to 2l/m and stopped Striverdi/Spiriva since when his samples ran out.  He says he is feeling much better w/ decreased dyspnea . No leg swelling. Less DOE.  O2 sats adequate on 2l/m at rest >90%  O2 sats walking drop <83% on 2lm walking , required 4lm with ambulation to stay above 88%.  No hemopytsis , chest pain, orthopnea, edema or fever.     Current Medications, Allergies, Complete Past Medical History, Past Surgical History, Family History, and Social History were reviewed in [employer] record.  ROS  The following are not active complaints unless bolded sore throat, dysphagia, dental problems, itching, sneezing,  nasal congestion or excess/ purulent secretions, ear ache,   fever, chills, sweats, unintended wt loss, pleuritic or exertional cp, hemoptysis,   , presyncope, palpitations, heartburn, abdominal pain, anorexia, nausea, vomiting, diarrhea  or change in bowel or urinary habits, change in stools or urine, dysuria,hematuria,  rash, arthralgias, visual complaints, headache, numbness weakness or ataxia or problems with walking or  coordination,  change in mood/affect or memory.            Objective:   Physical Exam  amb wm nad   12/21/2013 143>141 01/04/2014  > 02/07/2014  135 > 03/02/2014 137 > 03/06/2014  135 >130 04/04/2014     HEENT mild turbinate edema.  Oropharynx no thrush or excess pnd or cobblestoning.  No JVD or cervical adenopathy. Mild accessory muscle hypertrophy. Trachea midline, nl thryroid. Chest was hyperinflated by percussion with diminished breath sounds Regular rate and rhythm without murmur gallop or rub or increase P2 and tr  edema.  Abd: no hsm, nl excursion. Ext warm without cyanosis or clubbing.              Lab Results  Component Value Date   ESRSEDRATE 33* 03/06/2014   ESRSEDRATE 16 12/21/2013     Lab Results  Component Value Date   PROBNP 164.0* 03/06/2014     Recent Labs Lab 03/06/14 1226  NA 142  K 4.0  CL 97  CO2 36*  BUN 23  CREATININE 1.1  GLUCOSE 204*    Recent Labs Lab 03/06/14 1226  HGB 13.5  HCT 41.6  WBC 15.4*  PLT 398.0        cxr 03/02/14  Unchanged small bilateral effusions with slight worsening of bilateral mid and lower lung opacities, possibly atelectasis though worrisome for edema superimposed on advanced emphysematous change. Continued attention on follow-up is recommended      Assessment & Plan:

## 2014-04-04 NOTE — Addendum Note (Signed)
Addended by: Parke Poisson E on: 04/04/2014 12:13 PM   Modules accepted: Orders, Medications

## 2014-04-04 NOTE — Assessment & Plan Note (Signed)
Recent flare now resolved  Long discussion with wife and pt regarding need for O2 and severity of COPD Patient's medications were reviewed today and patient education was given. Computerized medication calendar was adjusted/completed   Plan  Follow medication calendar closely and bring to each visit. Wear Oxygen 2l/m at rest and 4l/m with walking  Restart Spiriva Respimat 2 puff daily  Restart Striverdi Respimat 2 puffs daily  Follow up Dr. Melvyn Novas  In 6 weeks and As needed   Please contact office for sooner follow up if symptoms do not improve or worsen or seek emergency care

## 2014-04-04 NOTE — Patient Instructions (Signed)
Follow medication calendar closely and bring to each visit. Wear Oxygen 2l/m at rest and 4l/m with walking  Restart Spiriva Respimat 2 puff daily  Restart Striverdi Respimat 2 puffs daily  Follow up Dr. Melvyn Novas  In 6 weeks and As needed   Please contact office for sooner follow up if symptoms do not improve or worsen or seek emergency care

## 2014-04-04 NOTE — Telephone Encounter (Signed)
Called pharmacy and they told me that Rx's were there and that Spiriva was filled and waiting for pick up but Striverdi was on hold which could mean a PA could be needed or the pharmacy has to order it. I asked they put both Rx's on hold as patient was given samples of each today and he could call them once he needed the Rx's filled.   Pt is aware of above and will call for rx's to be filled once he needs them. Nothing more needed at this time.

## 2014-04-11 ENCOUNTER — Telehealth: Payer: Self-pay | Admitting: Internal Medicine

## 2014-04-11 NOTE — Telephone Encounter (Signed)
Stop striverdi and spriva and just use anoro once daily - if wants to get samples of striverdi until we make this change next ov that's ok too

## 2014-04-11 NOTE — Telephone Encounter (Signed)
Pt was seen by MW on 03/02/2014. MW wanted pt to start taking Striverdi but order was not put in. TP saw pt 6/23 for med calendar and Striverdi was ordered per MW's previous OV recs. Received PA for Striverdi.   Alternatives for Striverdi: Anoro, Foradil, Serevent, Spiriva.   MW please advise if to continue with PA or with an alternative. Thanks.

## 2014-04-11 NOTE — Telephone Encounter (Signed)
ATC pt line busy x 4 wcb 

## 2014-04-12 MED ORDER — UMECLIDINIUM-VILANTEROL 62.5-25 MCG/INH IN AEPB: 1.0000 | INHALATION_SPRAY | Freq: Every day | RESPIRATORY_TRACT | Status: DC

## 2014-04-12 NOTE — Telephone Encounter (Signed)
LMTCB

## 2014-04-12 NOTE — Telephone Encounter (Signed)
Spoke with the pt and notified of recs per MW  Pt verbalized understanding  MAR updated  I have left samples up front and triage will show him how to use once he gets here

## 2014-04-17 ENCOUNTER — Ambulatory Visit (INDEPENDENT_AMBULATORY_CARE_PROVIDER_SITE_OTHER): Payer: Medicare Other | Admitting: *Deleted

## 2014-04-17 DIAGNOSIS — Z5181 Encounter for therapeutic drug level monitoring: Secondary | ICD-10-CM

## 2014-04-17 DIAGNOSIS — I4891 Unspecified atrial fibrillation: Secondary | ICD-10-CM

## 2014-04-17 LAB — POCT INR: INR: 2.1

## 2014-04-21 ENCOUNTER — Other Ambulatory Visit: Payer: Self-pay | Admitting: Cardiology

## 2014-04-27 ENCOUNTER — Telehealth: Payer: Self-pay | Admitting: Internal Medicine

## 2014-04-27 MED ORDER — UMECLIDINIUM-VILANTEROL 62.5-25 MCG/INH IN AEPB: 1.0000 | INHALATION_SPRAY | Freq: Every day | RESPIRATORY_TRACT | Status: DC

## 2014-04-27 NOTE — Telephone Encounter (Signed)
Called spoke with pt. He is still using the anoro and is working well for him. He is requesting samples. I have left them for pick up. Nothing further needed

## 2014-05-15 ENCOUNTER — Ambulatory Visit (INDEPENDENT_AMBULATORY_CARE_PROVIDER_SITE_OTHER): Payer: Medicare Other | Admitting: Pharmacist

## 2014-05-15 DIAGNOSIS — I4891 Unspecified atrial fibrillation: Secondary | ICD-10-CM

## 2014-05-15 DIAGNOSIS — Z5181 Encounter for therapeutic drug level monitoring: Secondary | ICD-10-CM

## 2014-05-15 LAB — POCT INR: INR: 1.8

## 2014-05-16 ENCOUNTER — Ambulatory Visit (INDEPENDENT_AMBULATORY_CARE_PROVIDER_SITE_OTHER)
Admission: RE | Admit: 2014-05-16 | Discharge: 2014-05-16 | Disposition: A | Discharge status: Home / Self Care | Payer: Medicare Other | Source: Ambulatory Visit | Attending: Internal Medicine | Admitting: Internal Medicine

## 2014-05-16 ENCOUNTER — Ambulatory Visit (INDEPENDENT_AMBULATORY_CARE_PROVIDER_SITE_OTHER): Payer: Medicare Other | Admitting: Internal Medicine

## 2014-05-16 ENCOUNTER — Encounter: Payer: Self-pay | Admitting: Internal Medicine

## 2014-05-16 VITALS — BP 108/60 | HR 62 | Temp 97.7°F | Ht 69.0 in | Wt 131.6 lb

## 2014-05-16 DIAGNOSIS — J9612 Chronic respiratory failure with hypercapnia: Secondary | ICD-10-CM

## 2014-05-16 DIAGNOSIS — J9610 Chronic respiratory failure, unspecified whether with hypoxia or hypercapnia: Secondary | ICD-10-CM

## 2014-05-16 DIAGNOSIS — J449 Chronic obstructive pulmonary disease, unspecified: Secondary | ICD-10-CM

## 2014-05-16 DIAGNOSIS — J4489 Other specified chronic obstructive pulmonary disease: Secondary | ICD-10-CM

## 2014-05-16 DIAGNOSIS — J90 Pleural effusion, not elsewhere classified: Secondary | ICD-10-CM

## 2014-05-16 MED ORDER — UMECLIDINIUM-VILANTEROL 62.5-25 MCG/INH IN AEPB: 1.0000 | INHALATION_SPRAY | Freq: Every day | RESPIRATORY_TRACT | Status: DC

## 2014-05-16 NOTE — Assessment & Plan Note (Signed)
Marked serial improvement > use lasix prn     Each maintenance medication was reviewed in detail including most importantly the difference between maintenance and as needed and under what circumstances the prns are to be used. This was done in the context of a medication calendar review which provided the patient with a user-friendly unambiguous mechanism for medication administration and reconciliation and provides an action plan for all active problems. It is critical that this be shown to every doctor  for modification during the office visit if necessary so the patient can use it as a working document.

## 2014-05-16 NOTE — Progress Notes (Signed)
Subjective:    Patient ID: Logan [name], male    DOB: Jul 17, 1925   MRN: [id number]    Brief patient profile:  18 yowm quit smoking 1992 referred 11/24/2013 by Dr Arelia Sneddon to pulmonary clinic for eval of new resp failure dx as GOLD IV with hypercapnia      History of Present Illness  Date of Admission: 10/30/2013 Date of Discharge: 11/04/13 Community-acquired-pneumonia Acute respiratory failure Afib w/ RVR HFpEF Sepsis HTN Hypothyroidism Constipation   Brief Hospital Course: He was admitted on 10/30/2013 for acute respiratory distress. His PMHx is significant for CHF with dilated cardiomyopathy, AFib HTN, HLD, Hypothyroidism, solitary pulmonary nodule and likely COPD by CXR. Flu was negative and he was treated for CAP with Vanc and Primaxin for 4 days due to hypoxia and new oxygen requirement. Cardiology was consulted to help manage his Afib (permanent) with RVR; Diltiazem drip was utilized, and switched back to Metoprolol with improvement. His antibiotics were narrowed to Levaquin once clinically improving. He was admitted with a new oxygen requirement, failed BiPAP due to worsening hypoxia, and treated with [place] O2 @ 5L which was unable to be weaned. Requiring 4L continuous at discharge .   CAP w/ Acute respiratory failure: Admitted to stepdown. Treated with Vanc and Primaxin x 4 days, then switched to Levaquin. New oxygen requirement; 4L Cardiac (Afib w/ RVR, HFpEF, Hypotension): Managed with diltiazem drip initially, and switched back to Metoprolol at decreased dose. ECHO showed EF 50-55%.   HTN: Lasix and Valsartan held for initial hypotension that resolved. Not restarted on d/c; BP well controlled @ 142/81 on discharge.   PT/OT: Established Home health. Given DME: shower chair, walker , portable O2    ECHO 11/02/13 Study Conclusions  - Left ventricle: The cavity size was normal. Wall thickness was normal. Systolic function was normal. The estimated ejection fraction was in the  range of 50% to 55%. Although no diagnostic regional wall motion abnormality was identified, this possibility cannot be completely excluded on the basis of this study. The study is not technically sufficient to allow evaluation of LV diastolic function. - Aortic valve: Mild regurgitation. - Right ventricle: The cavity size was mildly dilated. Wall thickness was normal. Systolic function was mildly reduced. - Right atrium: The atrium was moderately dilated. - Pulmonary arteries: Systolic pressure was moderately increased. PA peak pressure: 57mm Hg (S).     11/24/2013 post hosp initial office eval ov/Wert re: acute resp failure  / cor pulmonale   Prior to admit baseline = Doe x one year "when over do it" even going to mailbox and back sometimes   Not necessarily related to coughing. At Most could  do [employer], yard work= some weed eating and trimming  rec Stop all oil based vitamins and replace with powder if available Eat more fish - especially salmon  Please remember to go to the  x-ray department downstairs for your tests - we will call you with the results when they are available. Please schedule a follow up office visit in 4 weeks, sooner if needed - stay on same amount of oxygen in meantime  = 4lpm   12/21/2013 f/u ov/Wert re: new resp failure ? Etiology  Chief Complaint  Patient presents with  . Follow-up    Breathing is worse since last visit- esp in the am, has diff with getting dressed. Cough is some better. Sats 79%2lpm cont. at rest--increased to 74%4lpm cont.    no orthopnea but sob when stirring around, comfortable at  rest  Started on cozaar and lasix x 4 days prior to OV  By Dr Arelia Sneddon with progressive bilateral lower ext swelling but no better yet. Not wearing 02 as rec, just leaves on 2lpm due to small portable tank >>Increase your losartan to one whole and also lasix to one whole daily , 2 lasix first day x 1 , BNP decreased at 206.  O2 , 24/7, 4l/ rest, 6 l act     01/04/2014 Follow up and Med review  Patient returns for a followup and medication review. We reviewed all his medications and organized them into a medication calendar. Last visit. Patient was with increased lower shotty swelling. He was increased to Lasix to 40 mg daily, along with Cozaar 25 mg daily . Says that his lower extremity swelling has improved. Has noticed a slight improvement in shortness of breath. However, continues to get short of breath with minimal activity. He is down on oxygen 4 L at rest and 6 L with activity. Patient denies any hemoptysis, orthopnea, PND, or chest pain. Patient is a former smoker , Pulmonary  function test is pending Labs last visit showed BNP was decreased to 206. Sedimentation rate was normal  Chest x-ray showed no significant change in small to moderate bilateral pleural effusions with persistent multifocal pulmonary opacities in mid left lung.     02/07/2014 f/u ov/Wert re: chronic resp failure / GOLD III copd / cor pulmonale  Chief Complaint  Patient presents with  . Followup with PFT    Pt reports his breathing is doing well and denies any new co's today.   Not limited by breathing from desired activities  As long as on 02 2lpm / goal is to get off 02 completely rec Start Anoro take 2 puffs off one click each am Ok to leave off 02 at rest but wear it at 2lpm with acitivity and at bedtime Separating the top medications from the bottom group is fundamental to providing you adequate care going forward.      03/02/2014 f/u ov/Wert re:  GOLD III  ? aecopd  Chief Complaint  Patient presents with  . Acute Visit    Pt reports having increased cough and DOE for the past 10 days. He states that cough is occ prod with clear sputum.  He states that he was out of breath today walking from parking lot to door.     Not using action plan at bottom of calendar effectively. Does not have a saba Changed back to 02 24/7 at 3lpm  rec Prednisone 10 mg take  4  each am x 2 days,   2 each am x 2 days,  1 each am x 2 days and stop  Increase 02 to 4lpm for now As per calendar  Stop anoro Start spiriva 2 puffs each am Start Striverdi  2 puffs each am Only use your albuterol (proair)as a rescue medication Take extra furosemide 40 mg daily     03/06/2014 f/u ov/Wert re: chronic respiratory failure/ 4lpm 24/7  Chief Complaint  Patient presents with  . Follow-up    Pt states that his breathing is doing much better. No new co's today. Has not had to use rescue inhaler since the last visit.   still w/c bound but no resting sob, min cough s excess or purulent sputum >>extra lasix 40mg  daily    04/04/2014 Follow up and Med review  Pt returns for follow up and med review  Patient returns for a  followup and medication review. We reviewed all his medications and organized them into a medication calendar. It appears he has decreased his O2 to 2l/m and stopped Striverdi/Spiriva since when his samples ran out.  He says he is feeling much better w/ decreased dyspnea . No leg swelling. Less DOE.  O2 sats adequate on 2l/m at rest >90%  O2 sats walking drop <83% on 2lm walking , required 4lm with ambulation to stay above 88%.  No hemopytsis , chest pain, orthopnea, edema or fever.  rec Follow medication calendar closely and bring to each visit. Wear Oxygen 2l/m at rest and 4l/m with walking  Restart Spiriva Respimat 2 puff daily  Restart Striverdi Respimat 2 puffs daily   Changed to anoro by insurance 04/11/14   05/16/2014 f/u ov/Wert re: copd GOLD III pfts with hypercarbic resp failure => GOLD IV/ anoro only   Chief Complaint  Patient presents with  . Follow-up    Pt states that his breathing has improved. No new co's. Has not had to use rescue inhaler.    2lpm at [place] but not checking sats Not limited by breathing from desired activities as long as on 2lpm   No obvious day to day or daytime variabilty or assoc chronic cough or cp or chest tightness,  subjective wheeze overt sinus or hb symptoms. No unusual exp hx or h/o childhood pna/ asthma or knowledge of premature birth.  Sleeping ok without nocturnal  or early am exacerbation  of respiratory  c/o's or need for noct saba. Also denies any obvious fluctuation of symptoms with weather or environmental changes or other aggravating or alleviating factors except as outlined above   Current Medications, Allergies, Complete Past Medical History, Past Surgical History, Family History, and Social History were reviewed in [employer] record.  ROS  The following are not active complaints unless bolded sore throat, dysphagia, dental problems, itching, sneezing,  nasal congestion or excess/ purulent secretions, ear ache,   fever, chills, sweats, unintended wt loss, pleuritic or exertional cp, hemoptysis,  orthopnea pnd or leg swelling, presyncope, palpitations, heartburn, abdominal pain, anorexia, nausea, vomiting, diarrhea  or change in bowel or urinary habits, change in stools or urine, dysuria,hematuria,  rash, arthralgias, visual complaints, headache, numbness weakness or ataxia or problems with walking or coordination,  change in mood/affect or memory.                 Objective:   Physical Exam  amb wm nad   12/21/2013 143>141 01/04/2014  > 02/07/2014  135 > 03/02/2014 137 > 03/06/2014  135 >130 04/04/2014 >  05/16/2014 132     HEENT mild turbinate edema.  Oropharynx no thrush or excess pnd or cobblestoning.  No JVD or cervical adenopathy. Mild accessory muscle hypertrophy. Trachea midline, nl thryroid. Chest was hyperinflated by percussion with diminished breath sounds Regular rate and rhythm without murmur gallop or rub or increase P2 and No edema.  Abd: no hsm, nl excursion. Ext warm without cyanosis or clubbing.              Lab Results  Component Value Date   ESRSEDRATE 33* 03/06/2014   ESRSEDRATE 16 12/21/2013     Lab Results  Component Value Date   PROBNP 164.0*  03/06/2014     Recent Labs Lab 03/06/14 1226  NA 142  K 4.0  CL 97  CO2 36*  BUN 23  CREATININE 1.1  GLUCOSE 204*    Recent Labs Lab 03/06/14 1226  HGB 13.5  HCT 41.6  WBC 15.4*  PLT 398.0        CXR  05/16/2014 :   COPD/emphysema. Decreased pulmonary edema pattern. There may be mild residual.  Mild lung base opacities; favor atelectasis/scarring.  Trace residual pleural effusions.       Assessment & Plan:

## 2014-05-16 NOTE — Assessment & Plan Note (Signed)
See admit 10/30/13 > d/c'd on 4lpm 24/7  - as of 12/21/2013 rec 4lpm 247 except fo 6 lpm with activity  - 02/07/2014 93% RA so rec 02 2lpm at hs and with activity - 03/02/2014 2 lpm 84% > corrected on 4lpm - 03/06/2014 HC03 36 c/w chronic hypercarbia as well - 05/16/2014  Walked 2lpm  x 3 laps @ 185 ft each stopped due to end of study moderate pace no sat   rx as of  05/16/2014 2lpm

## 2014-05-16 NOTE — Patient Instructions (Addendum)
See calendar for specific medication instructions and bring it back for each and every office visit for every healthcare provider you see.  Without it,  you may not receive the best quality medical care that we feel you deserve.  You will note that the calendar groups together  your maintenance  medications that are timed at particular times of the day.  Think of this as your checklist for what your doctor has instructed you to do until your next evaluation to see what benefit  there is  to staying on a consistent group of medications intended to keep you well.  The other group at the bottom is entirely up to you to use as you see fit  for specific symptoms that may arise between visits that require you to treat them on an as needed basis.  Think of this as your action plan or "what if" list.   Separating the top medications from the bottom group is fundamental to providing you adequate care going forward.    02 is 2lpm 24/7 except ok to take off at rest if sats over 90's  Please schedule a follow up visit in 3 months but call sooner if needed

## 2014-05-16 NOTE — Assessment & Plan Note (Addendum)
-   PFT's 02/07/14  FEV1  0.69 (31%) ratio 41 no change p B2 and DLCO 30% - Trial of anoro 02/07/2014 > no better - trial of spiriva/ striverdi 03/02/2014 > improved 03/06/2014 > changed to anoro per formulary and doing fine> no change rx

## 2014-05-16 NOTE — Progress Notes (Signed)
Quick Note:  Spoke with pt and notified of results per Dr. Wert. Pt verbalized understanding and denied any questions.  ______ 

## 2014-06-05 ENCOUNTER — Ambulatory Visit (INDEPENDENT_AMBULATORY_CARE_PROVIDER_SITE_OTHER): Payer: Medicare Other | Admitting: *Deleted

## 2014-06-05 DIAGNOSIS — Z5181 Encounter for therapeutic drug level monitoring: Secondary | ICD-10-CM

## 2014-06-05 DIAGNOSIS — I4891 Unspecified atrial fibrillation: Secondary | ICD-10-CM

## 2014-06-05 LAB — POCT INR: INR: 2.2

## 2014-06-21 ENCOUNTER — Encounter: Payer: Self-pay | Admitting: Cardiology

## 2014-06-21 ENCOUNTER — Ambulatory Visit (INDEPENDENT_AMBULATORY_CARE_PROVIDER_SITE_OTHER): Payer: Medicare Other | Admitting: Cardiology

## 2014-06-21 VITALS — BP 112/62 | HR 82 | Ht 67.0 in | Wt 134.0 lb

## 2014-06-21 DIAGNOSIS — I4891 Unspecified atrial fibrillation: Secondary | ICD-10-CM

## 2014-06-21 DIAGNOSIS — I420 Dilated cardiomyopathy: Secondary | ICD-10-CM

## 2014-06-21 DIAGNOSIS — I5032 Chronic diastolic (congestive) heart failure: Secondary | ICD-10-CM

## 2014-06-21 DIAGNOSIS — I482 Chronic atrial fibrillation, unspecified: Secondary | ICD-10-CM

## 2014-06-21 DIAGNOSIS — I509 Heart failure, unspecified: Secondary | ICD-10-CM

## 2014-06-21 DIAGNOSIS — E785 Hyperlipidemia, unspecified: Secondary | ICD-10-CM

## 2014-06-21 DIAGNOSIS — I428 Other cardiomyopathies: Secondary | ICD-10-CM

## 2014-06-21 NOTE — Progress Notes (Signed)
Logan [name] Date of Birth: 02-Dec-1924   History of Present Illness: Mr. Caudill is seen for followup today. He has a history of permanent atrial fibrillation managed with rate control and anticoagulation. He also has chronic systolic heart failure with last ejection fraction of 55-60%. He was admitted to the hospital in January with community acquired PNA and respiratory failure. He has done very well since then. His breathing is doing well and he doesn't need oxygen most of the time. He takes his lasix only when he has increased swelling. INRs have been therapeutic.   Current Outpatient Prescriptions on File Prior to Visit  Medication Sig Dispense Refill  . aspirin 81 MG tablet Take 81 mg by mouth daily.      . Calcium-Magnesium-Zinc (CAL-MAG-ZINC PO) Take 1 tablet by mouth daily.       Marland Kitchen dextromethorphan-guaiFENesin (MUCINEX DM) 30-600 MG per 12 hr tablet Take 1-2 tablets by mouth every 12 (twelve) hours as needed (cough, congestion).      Marland Kitchen digoxin (LANOXIN) 0.125 MG tablet Take 1 tablet (0.125 mg total) by mouth daily.  30 tablet  11  . furosemide (LASIX) 40 MG tablet Take 40 mg by mouth daily. May take 1 extra daily as needed for leg swelling      . levothyroxine (SYNTHROID, LEVOTHROID) 75 MCG tablet Take 75 mcg by mouth daily before breakfast.      . losartan (COZAAR) 25 MG tablet Take 25 mg by mouth daily.       . metoprolol tartrate (LOPRESSOR) 25 MG tablet Take 1 tablet (25 mg total) by mouth 2 (two) times daily.  60 tablet  0  . Multiple Vitamin (MULTIVITAMIN) tablet Take 1 tablet by mouth daily.       Marland Kitchen PROAIR HFA 108 (90 BASE) MCG/ACT inhaler Inhale 2 puffs into the lungs every 4 (four) hours as needed.      . simvastatin (ZOCOR) 80 MG tablet Take 1 tablet (80 mg total) by mouth at bedtime.  30 tablet  11  . tamsulosin (FLOMAX) 0.4 MG CAPS capsule Take 0.4 mg by mouth daily after breakfast.       . Umeclidinium-Vilanterol 62.5-25 MCG/INH AEPB Inhale 1 puff into the lungs  daily.  1 each  11  . warfarin (COUMADIN) 3 MG tablet Take as directed per the coumadin clinic       No current facility-administered medications on file prior to visit.    No Known Allergies  Past Medical History  Diagnosis Date  . Chronic atrial fibrillation   . Dilated cardiomyopathy   . PVC's (premature ventricular contractions)   . Hyperlipidemia   . COPD (chronic obstructive pulmonary disease)   . Hypothyroidism   . Renal calculi   . Chronic anticoagulation   . CHF (congestive heart failure)     DUE TO SYSTOLIC DYSFUNCTION  . Shortness of breath     exertional  . Cancer     vocal cord - radiation     Past Surgical History  Procedure Laterality Date  . Cardiac catheterization  02/26/2007    EF 35-40%  . Appendectomy    . Tonsillectomy and adenoidectomy    . US echocardiography  07/08/2010    EF 50-55%  . US echocardiography  02/16/2007    EF 25-35%  . Transthoracic echocardiogram  08/26/2001    EF 25-30%  . Cardiovascular stress test  02/18/2007    EF 40%  . Microlaryngoscopy with co2 laser and excision of vocal cord lesion  15 years ago  . Lithotripsy    . Inguinal hernia repair Right 12/28/2012    Procedure: HERNIA REPAIR INGUINAL ADULT;  Surgeon: Joyice Faster. Cornett, MD;  Location: [facility];  Service: General;  Laterality: Right;    History  Smoking status  . Former Smoker -- 1.00 packs/day for 35 years  . Types: Cigarettes  . Quit date: 07/11/1961  Smokeless tobacco  . Not on file    History  Alcohol Use No    Family History  Problem Relation Age of Onset  . Stroke Mother   . Seizures Mother   . Heart attack Father   . Heart failure Father   . Heart attack Brother     Review of Systems: As per history of present illness.  All other systems were reviewed and are negative.  Physical Exam: BP 112/62  Pulse 82  Ht 5\' 7"  (1.702 m)  Wt 134 lb (60.782 kg)  BMI 20.98 kg/m2 He is a pleasant white male in no acute distress. The HEENT exam is  normal. The carotids are 2+ without bruits.  There is no thyromegaly.  There is no JVD.  The lungs reveal diminished BS throughout. No rales.  The heart exam reveals an irregular rate with a normal S1 and S2.  There are no murmurs, gallops, or rubs.  The PMI is not displaced.   Abdominal exam reveals good bowel sounds.  There is no hepatosplenomegaly or tenderness.    Exam of the legs no edema. No edema on the right. Pedal pulses are intact.  Cranial nerves II - XII are intact.  Motor and sensory functions are intact.  The gait is normal.  LABORATORY DATA: Lab Results  Component Value Date   WBC 15.4* 03/06/2014   HGB 13.5 03/06/2014   HCT 41.6 03/06/2014   PLT 398.0 03/06/2014   GLUCOSE 204* 03/06/2014   CHOL 136 10/31/2013   TRIG 77 10/31/2013   HDL 50 10/31/2013   LDLCALC 71 10/31/2013   ALT 25 11/02/2013   AST 29 11/02/2013   NA 142 03/06/2014   K 4.0 03/06/2014   CL 97 03/06/2014   CREATININE 1.1 03/06/2014   BUN 23 03/06/2014   CO2 36* 03/06/2014   TSH 0.81 03/06/2014   INR 2.2 06/05/2014    BNP 164   Assessment / Plan: 1. Atrial fibrillation, permanent. Rate is well controlled today. We will continue on his current dose of metoprolol and digoxin. Continue anticoagulation with Coumadin. INR is therapeutic. He does not need to take ASA.  2. History of dilated cardiomyopathy. In 2008 ejection fraction was 25-35%. Repeat echocardiogram in Jan showed EF of 50-55%. He will continue with metoprolol and ARB.   3. Chronic diastolic CHF. Improved. Use lasix prn.  4. Hyperlipidemia. Continue simvastatin and fish oil.  5. Acute on chronic respiratory failure s/p PNA in January. Pulmonary HTN by Echo. Per Dr. Melvyn Novas.

## 2014-06-21 NOTE — Patient Instructions (Signed)
Continue your current therapy  I will see you in 6 months.   

## 2014-06-22 ENCOUNTER — Other Ambulatory Visit: Payer: Self-pay | Admitting: Cardiology

## 2014-06-23 ENCOUNTER — Other Ambulatory Visit: Payer: Self-pay | Admitting: *Deleted

## 2014-06-23 MED ORDER — LEVOTHYROXINE SODIUM 75 MCG PO TABS: 75.0000 ug | ORAL_TABLET | Freq: Every day | ORAL | Status: DC

## 2014-06-23 NOTE — Telephone Encounter (Signed)
Does Dr Martinique refill this or should it be deferred to pcp? Thanks, MI

## 2014-06-23 NOTE — Telephone Encounter (Signed)
Pt said [facility] told him they could not refill his Levothyroxine 75 mg. They told him to call and talk to Logan [name].Please call him and let him know something today.

## 2014-06-29 ENCOUNTER — Ambulatory Visit: Payer: Medicare Other | Admitting: Cardiology

## 2014-07-04 ENCOUNTER — Telehealth: Payer: Self-pay | Admitting: Internal Medicine

## 2014-07-04 NOTE — Telephone Encounter (Signed)
LMTCB

## 2014-07-05 NOTE — Telephone Encounter (Signed)
Spoke with the pt  He states that [facility] will not refill anoro  I called and spoke with Cristie Hem, a pharmacist at [facility] and he states that this was an error on their part- someone put the wrong quantity on last refill  They are getting this ready for the pt now  Pt aware  Nothing further needed

## 2014-07-10 ENCOUNTER — Ambulatory Visit (INDEPENDENT_AMBULATORY_CARE_PROVIDER_SITE_OTHER): Payer: Medicare Other

## 2014-07-10 DIAGNOSIS — Z5181 Encounter for therapeutic drug level monitoring: Secondary | ICD-10-CM

## 2014-07-10 DIAGNOSIS — I4891 Unspecified atrial fibrillation: Secondary | ICD-10-CM

## 2014-07-10 LAB — POCT INR: INR: 2.8

## 2014-08-15 ENCOUNTER — Telehealth: Payer: Self-pay | Admitting: Cardiology

## 2014-08-15 ENCOUNTER — Ambulatory Visit (INDEPENDENT_AMBULATORY_CARE_PROVIDER_SITE_OTHER): Payer: Medicare Other | Admitting: Internal Medicine

## 2014-08-15 ENCOUNTER — Encounter: Payer: Self-pay | Admitting: Internal Medicine

## 2014-08-15 VITALS — BP 104/60 | HR 79 | Ht 68.0 in | Wt 134.0 lb

## 2014-08-15 DIAGNOSIS — J9612 Chronic respiratory failure with hypercapnia: Secondary | ICD-10-CM

## 2014-08-15 DIAGNOSIS — J449 Chronic obstructive pulmonary disease, unspecified: Secondary | ICD-10-CM

## 2014-08-15 MED ORDER — PREDNISONE 10 MG PO TABS: ORAL_TABLET | ORAL | Status: DC

## 2014-08-15 NOTE — Telephone Encounter (Signed)
Logan [name] spoke with pt and his wife advised Prednisone can interact with Coumadin, needs sooner appt to check INR.  Made follow-up appt for Friday 08/18/14.

## 2014-08-15 NOTE — Patient Instructions (Addendum)
Prednisone 10 mg take  4 each am x 2 days,   2 each am x 2 days,  1 each am x 2 days and stop   See calendar for specific medication instructions and bring it back for each and every office visit for every healthcare provider you see.  Without it,  you may not receive the best quality medical care that we feel you deserve.  You will note that the calendar groups together  your maintenance  medications that are timed at particular times of the day.  Think of this as your checklist for what your doctor has instructed you to do until your next evaluation to see what benefit  there is  to staying on a consistent group of medications intended to keep you well.  The other group at the bottom is entirely up to you to use as you see fit  for specific symptoms that may arise between visits that require you to treat them on an as needed basis.  Think of this as your action plan or "what if" list.   Separating the top medications from the bottom group is fundamental to providing you adequate care going forward.    Use 02 at bedtime at 2lpm and when exerting but not at rest or just walking around the house   Please schedule a follow up visit in 3 months but call sooner if needed

## 2014-08-15 NOTE — Telephone Encounter (Signed)
New message  Pt called states that he has a new medication prednisone.. Requests a call back to discuss id it will interfere with his coumadin.

## 2014-08-15 NOTE — Assessment & Plan Note (Signed)
See admit 10/30/13 > d/c'd on 4lpm 24/7  - as of 12/21/2013 rec 4lpm 247 except fo 6 lpm with activity  - 02/07/2014 93% RA so rec 02 2lpm at hs and with activity - 03/02/2014 2 lpm 84% > corrected on 4lpm - 03/06/2014 HC03 36 c/w chronic hypercarbia as well - 05/16/2014  Walked 2lpm  x 3 laps @ 185 ft each stopped due to end of study moderate pace no desat - 08/15/2014   Walked RA x one lap @ 185 stopped due to sat 90% and nl pace   rx as of 08/15/2014 use 02 2lpm with sleep and exertion but not at rest/ walking around the house or to restaurants/ churches etc where sits down on arrival

## 2014-08-15 NOTE — Progress Notes (Signed)
Subjective:    Patient ID: Logan [name], male    DOB: [birth year]   MRN: [id number]    Brief patient profile:  21 yowm quit smoking 1992 referred 11/24/2013 by Dr Arelia Sneddon to pulmonary clinic for eval of new resp failure dx as GOLD IV with hypercapnia      History of Present Illness  Date of Admission: 10/30/2013 Date of Discharge: 11/04/13 Community-acquired-pneumonia Acute respiratory failure Afib w/ RVR HFpEF Sepsis HTN Hypothyroidism Constipation   Brief Hospital Course: He was admitted on 10/30/2013 for acute respiratory distress. His PMHx is significant for CHF with dilated cardiomyopathy, AFib HTN, HLD, Hypothyroidism, solitary pulmonary nodule and likely COPD by CXR. Flu was negative and he was treated for CAP with Vanc and Primaxin for 4 days due to hypoxia and new oxygen requirement. Cardiology was consulted to help manage his Afib (permanent) with RVR; Diltiazem drip was utilized, and switched back to Metoprolol with improvement. His antibiotics were narrowed to Levaquin once clinically improving. He was admitted with a new oxygen requirement, failed BiPAP due to worsening hypoxia, and treated with [place] O2 @ 5L which was unable to be weaned. Requiring 4L continuous at discharge .   CAP w/ Acute respiratory failure: Admitted to stepdown. Treated with Vanc and Primaxin x 4 days, then switched to Levaquin. New oxygen requirement; 4L Cardiac (Afib w/ RVR, HFpEF, Hypotension): Managed with diltiazem drip initially, and switched back to Metoprolol at decreased dose. ECHO showed EF 50-55%.   HTN: Lasix and Valsartan held for initial hypotension that resolved. Not restarted on d/c; BP well controlled @ 142/81 on discharge.   PT/OT: Established Home health. Given DME: shower chair, walker , portable O2    ECHO 11/02/13 Study Conclusions  - Left ventricle: The cavity size was normal. Wall thickness was normal. Systolic function was normal. The estimated ejection fraction was in the  range of 50% to 55%. Although no diagnostic regional wall motion abnormality was identified, this possibility cannot be completely excluded on the basis of this study. The study is not technically sufficient to allow evaluation of LV diastolic function. - Aortic valve: Mild regurgitation. - Right ventricle: The cavity size was mildly dilated. Wall thickness was normal. Systolic function was mildly reduced. - Right atrium: The atrium was moderately dilated. - Pulmonary arteries: Systolic pressure was moderately increased. PA peak pressure: 44mm Hg (S).     11/24/2013 post hosp initial office eval ov/Lenwood Balsam re: acute resp failure  / cor pulmonale   Prior to admit baseline = Doe x one year "when over do it" even going to mailbox and back sometimes   Not necessarily related to coughing. At Most could  do [employer], yard work= some weed eating and trimming  rec Stop all oil based vitamins and replace with powder if available Eat more fish - especially salmon  Please remember to go to the  x-ray department downstairs for your tests - we will call you with the results when they are available. Please schedule a follow up office visit in 4 weeks, sooner if needed - stay on same amount of oxygen in meantime  = 4lpm   12/21/2013 f/u ov/Aamirah Salmi re: new resp failure ? Etiology  Chief Complaint  Patient presents with  . Follow-up    Breathing is worse since last visit- esp in the am, has diff with getting dressed. Cough is some better. Sats 79%2lpm cont. at rest--increased to 74%4lpm cont.    no orthopnea but sob when stirring around, comfortable at  rest  Started on cozaar and lasix x 4 days prior to OV  By Dr Arelia Sneddon with progressive bilateral lower ext swelling but no better yet. Not wearing 02 as rec, just leaves on 2lpm due to small portable tank >>Increase your losartan to one whole and also lasix to one whole daily , 2 lasix first day x 1 , BNP decreased at 206.  O2 , 24/7, 4l/ rest, 6 l act     01/04/2014 Follow up and Med review  Patient returns for a followup and medication review. We reviewed all his medications and organized them into a medication calendar. Last visit. Patient was with increased lower shotty swelling. He was increased to Lasix to 40 mg daily, along with Cozaar 25 mg daily . Says that his lower extremity swelling has improved. Has noticed a slight improvement in shortness of breath. However, continues to get short of breath with minimal activity. He is down on oxygen 4 L at rest and 6 L with activity. Patient denies any hemoptysis, orthopnea, PND, or chest pain. Patient is a former smoker , Pulmonary  function test is pending Labs last visit showed BNP was decreased to 206. Sedimentation rate was normal  Chest x-ray showed no significant change in small to moderate bilateral pleural effusions with persistent multifocal pulmonary opacities in mid left lung.     02/07/2014 f/u ov/Shauntelle Jamerson re: chronic resp failure / GOLD III copd / cor pulmonale  Chief Complaint  Patient presents with  . Followup with PFT    Pt reports his breathing is doing well and denies any new co's today.   Not limited by breathing from desired activities  As long as on 02 2lpm / goal is to get off 02 completely rec Start Anoro take 2 puffs off one click each am Ok to leave off 02 at rest but wear it at 2lpm with acitivity and at bedtime Separating the top medications from the bottom group is fundamental to providing you adequate care going forward.      03/02/2014 f/u ov/Peityn Payton re:  GOLD III  ? aecopd  Chief Complaint  Patient presents with  . Acute Visit    Pt reports having increased cough and DOE for the past 10 days. He states that cough is occ prod with clear sputum.  He states that he was out of breath today walking from parking lot to door.     Not using action plan at bottom of calendar effectively. Does not have a saba Changed back to 02 24/7 at 3lpm  rec Prednisone 10 mg take  4  each am x 2 days,   2 each am x 2 days,  1 each am x 2 days and stop  Increase 02 to 4lpm for now As per calendar  Stop anoro Start spiriva 2 puffs each am Start Striverdi  2 puffs each am Only use your albuterol (proair)as a rescue medication Take extra furosemide 40 mg daily     03/06/2014 f/u ov/Finnis Colee re: chronic respiratory failure/ 4lpm 24/7  Chief Complaint  Patient presents with  . Follow-up    Pt states that his breathing is doing much better. No new co's today. Has not had to use rescue inhaler since the last visit.   still w/c bound but no resting sob, min cough s excess or purulent sputum >>extra lasix 40mg  daily    04/04/2014 Follow up and Med review  Pt returns for follow up and med review  Patient returns for a  followup and medication review. We reviewed all his medications and organized them into a medication calendar. It appears he has decreased his O2 to 2l/m and stopped Striverdi/Spiriva since when his samples ran out.  He says he is feeling much better w/ decreased dyspnea . No leg swelling. Less DOE.  O2 sats adequate on 2l/m at rest >90%  O2 sats walking drop <83% on 2lm walking , required 4lm with ambulation to stay above 88%.  No hemopytsis , chest pain, orthopnea, edema or fever.  rec Follow medication calendar closely and bring to each visit. Wear Oxygen 2l/m at rest and 4l/m with walking  Restart Spiriva Respimat 2 puff daily  Restart Striverdi Respimat 2 puffs daily   Changed to anoro by insurance 04/11/14   05/16/2014 f/u ov/Missi Mcmackin re: copd GOLD III pfts with hypercarbic resp failure => GOLD IV/ anoro only   Chief Complaint  Patient presents with  . Follow-up    Pt states that his breathing has improved. No new co's. Has not had to use rescue inhaler.   2lpm at [place] but not checking sats Not limited by breathing from desired activities as long as on 2lpm  rec 02 is 2lpm 24/7 except ok to take off at rest if sats over 90's   08/15/2014 f/u  ov/Taria Castrillo re: GOLD III/ chronic hypercarbic resp failure  Chief Complaint  Patient presents with  . Follow-up    Pt states that his breathing is doing well. Has not used rescue inhaler since the last visit. No new co's today.    does have variable cough/ congestion with white mucus and is worse x sev days prior to OV  But has not used med calendar action plan to use either mucinex or saba as instructed. Feels anoro best rx he's tried and affordable on his plan.   No obvious day to day or daytime variabilty or assoc  cp or chest tightness, subjective wheeze overt sinus or hb symptoms. No unusual exp hx or h/o childhood pna/ asthma or knowledge of premature birth.  Sleeping ok without nocturnal  or early am exacerbation  of respiratory  c/o's or need for noct saba. Also denies any obvious fluctuation of symptoms with weather or environmental changes or other aggravating or alleviating factors except as outlined above   Current Medications, Allergies, Complete Past Medical History, Past Surgical History, Family History, and Social History were reviewed in [employer] record.  ROS  The following are not active complaints unless bolded sore throat, dysphagia, dental problems, itching, sneezing,  nasal congestion or excess/ purulent secretions, ear ache,   fever, chills, sweats, unintended wt loss, pleuritic or exertional cp, hemoptysis,  orthopnea pnd or leg swelling, presyncope, palpitations, heartburn, abdominal pain, anorexia, nausea, vomiting, diarrhea  or change in bowel or urinary habits, change in stools or urine, dysuria,hematuria,  rash, arthralgias, visual complaints, headache, numbness weakness or ataxia or problems with walking or coordination,  change in mood/affect or memory.                 Objective:   Physical Exam  amb wm nad junky congested sounding cough   12/21/2013 143>141 01/04/2014  > 02/07/2014  135 > 03/02/2014 137 > 03/06/2014  135 >130 04/04/2014 >   05/16/2014 132 > 08/15/2014   135     HEENT mild turbinate edema.  Oropharynx no thrush or excess pnd or cobblestoning.  No JVD or cervical adenopathy. Mild accessory muscle hypertrophy. Trachea midline, nl thryroid. Chest was  hyperinflated by percussion with diminished breath sounds Regular rate and rhythm without murmur gallop or rub or increase P2 and No edema.  Abd: no hsm, nl excursion. Ext warm without cyanosis or clubbing.         Lab Results  Component Value Date   ESRSEDRATE 33* 03/06/2014   ESRSEDRATE 16 12/21/2013     Lab Results  Component Value Date   PROBNP 164.0* 03/06/2014     Recent Labs Lab 03/06/14 1226  NA 142  K 4.0  CL 97  CO2 36*  BUN 23  CREATININE 1.1  GLUCOSE 204*    Recent Labs Lab 03/06/14 1226  HGB 13.5  HCT 41.6  WBC 15.4*  PLT 398.0        CXR  05/16/2014 :   COPD/emphysema. Decreased pulmonary edema pattern.   Mild lung base opacities; favor atelectasis/scarring. Trace residual pleural effusions.       Assessment & Plan:

## 2014-08-15 NOTE — Assessment & Plan Note (Signed)
-   PFT's 02/07/14  FEV1  0.69 (31%) ratio 41 no change p B2 and DLCO 30% - Trial of anoro 02/07/2014 > no better - trial of spiriva/ striverdi 03/02/2014 > improved 03/06/2014 > changed to anoro per formulary 05/16/2014 > improved  But now with mild flare of AB ? May need to change to BREO/[place] to get the ICS  For now rx Prednisone 10 mg take  4 each am x 2 days,   2 each am x 2 days,  1 each am x 2 days and stop    Each maintenance medication was reviewed in detail including most importantly the difference between maintenance and as needed and under what circumstances the prns are to be used. This was done in the context of a medication calendar review which provided the patient with a user-friendly unambiguous mechanism for medication administration and reconciliation and provides an action plan for all active problems. It is critical that this be shown to every doctor  for modification during the office visit if necessary so the patient can use it as a working document.

## 2014-08-18 ENCOUNTER — Ambulatory Visit (INDEPENDENT_AMBULATORY_CARE_PROVIDER_SITE_OTHER): Payer: Medicare Other | Admitting: *Deleted

## 2014-08-18 DIAGNOSIS — I4891 Unspecified atrial fibrillation: Secondary | ICD-10-CM

## 2014-08-18 DIAGNOSIS — Z5181 Encounter for therapeutic drug level monitoring: Secondary | ICD-10-CM

## 2014-08-18 LAB — POCT INR: INR: 2.5

## 2014-09-07 ENCOUNTER — Other Ambulatory Visit: Payer: Self-pay | Admitting: Cardiology

## 2014-09-07 MED ORDER — DIGOXIN 125 MCG PO TABS: 0.1250 mg | ORAL_TABLET | Freq: Every day | ORAL | Status: DC

## 2014-09-07 NOTE — Telephone Encounter (Signed)
Returned call to patient digoxin refill sent to pharmacy.

## 2014-09-07 NOTE — Telephone Encounter (Signed)
Mr.Shorten is calling to get his Digoxin 125 mcg refilled . Has tried to get it refilled but was told he needed a authorization . Please send to [facility] on [address] . Please Call  Thanks

## 2014-09-21 ENCOUNTER — Other Ambulatory Visit: Payer: Self-pay | Admitting: *Deleted

## 2014-09-21 ENCOUNTER — Telehealth: Payer: Self-pay | Admitting: Cardiology

## 2014-09-21 MED ORDER — WARFARIN SODIUM 3 MG PO TABS: ORAL_TABLET | ORAL | Status: DC

## 2014-09-21 NOTE — Telephone Encounter (Signed)
Pt is having problems with receiving his Warfarin

## 2014-09-21 NOTE — Telephone Encounter (Signed)
Refill done as requested 

## 2014-09-21 NOTE — Telephone Encounter (Signed)
Returned call to patient he stated he needed warfarin refill.Message sent to [facility] at [facility].

## 2014-09-25 ENCOUNTER — Ambulatory Visit (INDEPENDENT_AMBULATORY_CARE_PROVIDER_SITE_OTHER): Payer: Medicare Other

## 2014-09-25 DIAGNOSIS — Z5181 Encounter for therapeutic drug level monitoring: Secondary | ICD-10-CM

## 2014-09-25 DIAGNOSIS — I4891 Unspecified atrial fibrillation: Secondary | ICD-10-CM

## 2014-09-25 LAB — POCT INR: INR: 3.4

## 2014-10-23 ENCOUNTER — Ambulatory Visit (INDEPENDENT_AMBULATORY_CARE_PROVIDER_SITE_OTHER): Payer: Medicare Other | Admitting: *Deleted

## 2014-10-23 DIAGNOSIS — Z5181 Encounter for therapeutic drug level monitoring: Secondary | ICD-10-CM

## 2014-10-23 DIAGNOSIS — I4891 Unspecified atrial fibrillation: Secondary | ICD-10-CM

## 2014-10-23 LAB — POCT INR: INR: 3.4

## 2014-11-06 ENCOUNTER — Ambulatory Visit (INDEPENDENT_AMBULATORY_CARE_PROVIDER_SITE_OTHER): Payer: Medicare Other | Admitting: Pharmacist

## 2014-11-06 DIAGNOSIS — Z5181 Encounter for therapeutic drug level monitoring: Secondary | ICD-10-CM

## 2014-11-06 DIAGNOSIS — I4891 Unspecified atrial fibrillation: Secondary | ICD-10-CM

## 2014-11-06 LAB — POCT INR: INR: 2.9

## 2014-11-15 ENCOUNTER — Ambulatory Visit (INDEPENDENT_AMBULATORY_CARE_PROVIDER_SITE_OTHER): Payer: Medicare Other | Admitting: Internal Medicine

## 2014-11-15 ENCOUNTER — Encounter: Payer: Self-pay | Admitting: Internal Medicine

## 2014-11-15 VITALS — BP 104/78 | HR 80 | Temp 97.7°F | Ht 68.0 in | Wt 133.4 lb

## 2014-11-15 DIAGNOSIS — J449 Chronic obstructive pulmonary disease, unspecified: Secondary | ICD-10-CM

## 2014-11-15 DIAGNOSIS — J9612 Chronic respiratory failure with hypercapnia: Secondary | ICD-10-CM

## 2014-11-15 NOTE — Patient Instructions (Signed)
See calendar for specific medication instructions and bring it back for each and every office visit for every healthcare provider you see.  Without it,  you may not receive the best quality medical care that we feel you deserve.  You will note that the calendar groups together  your maintenance  medications that are timed at particular times of the day.  Think of this as your checklist for what your doctor has instructed you to do until your next evaluation to see what benefit  there is  to staying on a consistent group of medications intended to keep you well.  The other group at the bottom is entirely up to you to use as you see fit  for specific symptoms that may arise between visits that require you to treat them on an as needed basis.  Think of this as your action plan or "what if" list.   Separating the top medications from the bottom group is fundamental to providing you adequate care going forward.    See Tammy NP 3 months with all your medications, even over the counter meds, separated in two separate bags, the ones you take no matter what vs the ones you stop once you feel better and take only as needed when you feel you need them.   Tammy  will generate for you a new user friendly medication calendar that will put [place] all on the same page re: your medication use.

## 2014-11-15 NOTE — Progress Notes (Addendum)
Subjective:    Patient ID: Logan [name], male    DOB: 01-26-1925   MRN: [id number]    Brief patient profile:  30 yowm quit smoking 1992 referred 11/24/2013 by Dr Arelia Sneddon to pulmonary clinic for eval of new resp failure dx as GOLD IV with hypercapnia      History of Present Illness  Date of Admission: 10/30/2013 Date of Discharge: 11/04/13 Community-acquired-pneumonia Acute respiratory failure Afib w/ RVR HFpEF Sepsis HTN Hypothyroidism Constipation   Brief Hospital Course: He was admitted on 10/30/2013 for acute respiratory distress. His PMHx is significant for CHF with dilated cardiomyopathy, AFib HTN, HLD, Hypothyroidism, solitary pulmonary nodule and likely COPD by CXR. Flu was negative and he was treated for CAP with Vanc and Primaxin for 4 days due to hypoxia and new oxygen requirement. Cardiology was consulted to help manage his Afib (permanent) with RVR; Diltiazem drip was utilized, and switched back to Metoprolol with improvement. His antibiotics were narrowed to Levaquin once clinically improving. He was admitted with a new oxygen requirement, failed BiPAP due to worsening hypoxia, and treated with [place] O2 @ 5L which was unable to be weaned. Requiring 4L continuous at discharge .   CAP w/ Acute respiratory failure: Admitted to stepdown. Treated with Vanc and Primaxin x 4 days, then switched to Levaquin. New oxygen requirement; 4L Cardiac (Afib w/ RVR, HFpEF, Hypotension): Managed with diltiazem drip initially, and switched back to Metoprolol at decreased dose. ECHO showed EF 50-55%.   HTN: Lasix and Valsartan held for initial hypotension that resolved. Not restarted on d/c; BP well controlled @ 142/81 on discharge.   PT/OT: Established Home health. Given DME: shower chair, walker , portable O2    ECHO 11/02/13 Study Conclusions  - Left ventricle: The cavity size was normal. Wall thickness was normal. Systolic function was normal. The estimated ejection fraction was in the  range of 50% to 55%. Although no diagnostic regional wall motion abnormality was identified, this possibility cannot be completely excluded on the basis of this study. The study is not technically sufficient to allow evaluation of LV diastolic function. - Aortic valve: Mild regurgitation. - Right ventricle: The cavity size was mildly dilated. Wall thickness was normal. Systolic function was mildly reduced. - Right atrium: The atrium was moderately dilated. - Pulmonary arteries: Systolic pressure was moderately increased. PA peak pressure: 53mm Hg (S).     11/24/2013 post hosp initial office eval ov/Cashmere Harmes re: acute resp failure  / cor pulmonale   Prior to admit baseline = Doe x one year "when over do it" even going to mailbox and back sometimes   Not necessarily related to coughing. At Most could  do [employer], yard work= some weed eating and trimming  rec Stop all oil based vitamins and replace with powder if available Eat more fish - especially salmon  Please remember to go to the  x-ray department downstairs for your tests - we will call you with the results when they are available. Please schedule a follow up office visit in 4 weeks, sooner if needed - stay on same amount of oxygen in meantime  = 4lpm   12/21/2013 f/u ov/Glory Graefe re: new resp failure ? Etiology  Chief Complaint  Patient presents with  . Follow-up    Breathing is worse since last visit- esp in the am, has diff with getting dressed. Cough is some better. Sats 79%2lpm cont. at rest--increased to 74%4lpm cont.    no orthopnea but sob when stirring around, comfortable at  rest  Started on cozaar and lasix x 4 days prior to OV  By Dr Arelia Sneddon with progressive bilateral lower ext swelling but no better yet. Not wearing 02 as rec, just leaves on 2lpm due to small portable tank >>Increase your losartan to one whole and also lasix to one whole daily , 2 lasix first day x 1 , BNP decreased at 206.  O2 , 24/7, 4l/ rest, 6 l act     01/04/2014 Follow up and Med review  Patient returns for a followup and medication review. We reviewed all his medications and organized them into a medication calendar. Last visit. Patient was with increased lower shotty swelling. He was increased to Lasix to 40 mg daily, along with Cozaar 25 mg daily . Says that his lower extremity swelling has improved. Has noticed a slight improvement in shortness of breath. However, continues to get short of breath with minimal activity. He is down on oxygen 4 L at rest and 6 L with activity. Patient denies any hemoptysis, orthopnea, PND, or chest pain. Patient is a former smoker , Pulmonary  function test is pending Labs last visit showed BNP was decreased to 206. Sedimentation rate was normal  Chest x-ray showed no significant change in small to moderate bilateral pleural effusions with persistent multifocal pulmonary opacities in mid left lung.     02/07/2014 f/u ov/Nafisa Olds re: chronic resp failure / GOLD III copd / cor pulmonale  Chief Complaint  Patient presents with  . Followup with PFT    Pt reports his breathing is doing well and denies any new co's today.   Not limited by breathing from desired activities  As long as on 02 2lpm / goal is to get off 02 completely rec Start Anoro take 2 puffs off one click each am Ok to leave off 02 at rest but wear it at 2lpm with acitivity and at bedtime Separating the top medications from the bottom group is fundamental to providing you adequate care going forward.      03/02/2014 f/u ov/Livy Ross re:  GOLD III  ? aecopd  Chief Complaint  Patient presents with  . Acute Visit    Pt reports having increased cough and DOE for the past 10 days. He states that cough is occ prod with clear sputum.  He states that he was out of breath today walking from parking lot to door.     Not using action plan at bottom of calendar effectively. Does not have a saba Changed back to 02 24/7 at 3lpm  rec Prednisone 10 mg take  4  each am x 2 days,   2 each am x 2 days,  1 each am x 2 days and stop  Increase 02 to 4lpm for now As per calendar  Stop anoro Start spiriva 2 puffs each am Start Striverdi  2 puffs each am Only use your albuterol (proair)as a rescue medication Take extra furosemide 40 mg daily     03/06/2014 f/u ov/Braeton Wolgamott re: chronic respiratory failure/ 4lpm 24/7  Chief Complaint  Patient presents with  . Follow-up    Pt states that his breathing is doing much better. No new co's today. Has not had to use rescue inhaler since the last visit.   still w/c bound but no resting sob, min cough s excess or purulent sputum >>extra lasix 40mg  daily    04/04/2014 Follow up and Med review  Pt returns for follow up and med review  Patient returns for a  followup and medication review. We reviewed all his medications and organized them into a medication calendar. It appears he has decreased his O2 to 2l/m and stopped Striverdi/Spiriva since when his samples ran out.  He says he is feeling much better w/ decreased dyspnea . No leg swelling. Less DOE.  O2 sats adequate on 2l/m at rest >90%  O2 sats walking drop <83% on 2lm walking , required 4lm with ambulation to stay above 88%.  No hemopytsis , chest pain, orthopnea, edema or fever.  rec Follow medication calendar closely and bring to each visit. Wear Oxygen 2l/m at rest and 4l/m with walking  Restart Spiriva Respimat 2 puff daily  Restart Striverdi Respimat 2 puffs daily   Changed to anoro by insurance 04/11/14   05/16/2014 f/u ov/Taeshaun Rames re: copd GOLD III pfts with hypercarbic resp failure => GOLD IV/ anoro only   Chief Complaint  Patient presents with  . Follow-up    Pt states that his breathing has improved. No new co's. Has not had to use rescue inhaler.   2lpm at [place] but not checking sats Not limited by breathing from desired activities as long as on 2lpm  rec 02 is 2lpm 24/7 except ok to take off at rest if sats over 90's   08/15/2014 f/u  ov/Kenta Laster re: GOLD III/ chronic hypercarbic resp failure  Chief Complaint  Patient presents with  . Follow-up    Pt states that his breathing is doing well. Has not used rescue inhaler since the last visit. No new co's today.    does have variable cough/ congestion with white mucus and is worse x sev days prior to OV  But has not used med calendar action plan to use either mucinex or saba as instructed. Feels anoro best rx he's tried and affordable on his plan. rec Prednisone 10 mg take  4 each am x 2 days,   2 each am x 2 days,  1 each am x 2 days and stop  See calendar for specific medication  Separating the top medications from the bottom group is fundamental to providing you adequate care going forward.   Use 02 at bedtime at 2lpm and when exerting but not at rest or just walking around the house    11/15/2014 f/u ov/Arwa Yero re: GOLD III/ chronic hypercarbic resp failure / on anoro and rarely saba prn  Chief Complaint  Patient presents with  . Follow-up    Pt states breathing unchanged, he has sl. cough with cleart to orange tinged mucus. Pt denies chest tightness/congestion and wheezing.   Not limited by breathing from desired activities  But very sedentary   No obvious day to day or daytime variabilty or assoc  cp or chest tightness, subjective wheeze overt sinus or hb symptoms. No unusual exp hx or h/o childhood pna/ asthma or knowledge of premature birth.  Sleeping ok without nocturnal  or early am exacerbation  of respiratory  c/o's or need for noct saba. Also denies any obvious fluctuation of symptoms with weather or environmental changes or other aggravating or alleviating factors except as outlined above   Current Medications, Allergies, Complete Past Medical History, Past Surgical History, Family History, and Social History were reviewed in [employer] record.  ROS  The following are not active complaints unless bolded sore throat, dysphagia, dental problems,  itching, sneezing,  nasal congestion or excess/ purulent secretions, ear ache,   fever, chills, sweats, unintended wt loss, pleuritic or exertional cp, hemoptysis,  orthopnea pnd or leg swelling, presyncope, palpitations, heartburn, abdominal pain, anorexia, nausea, vomiting, diarrhea  or change in bowel or urinary habits, change in stools or urine, dysuria,hematuria,  rash, arthralgias, visual complaints, headache, numbness weakness or ataxia or problems with walking or coordination,  change in mood/affect or memory.                 Objective:   Physical Exam  amb wm nad  Minimally congested sounding cough   12/21/2013 143>141 01/04/2014  > 02/07/2014  135 > 03/02/2014 137 > 03/06/2014  135 >130 04/04/2014 >  05/16/2014 132 > 08/15/2014   135 > 11/15/2014  133     HEENT mild turbinate edema.  Oropharynx no thrush or excess pnd or cobblestoning.  No JVD or cervical adenopathy. Mild accessory muscle hypertrophy. Trachea midline, nl thryroid. Chest was hyperinflated by percussion with diminished breath sounds Regular rate and rhythm without murmur gallop or rub or increase P2 and No edema.  Abd: no hsm, nl excursion. Ext warm without cyanosis or clubbing.         Lab Results  Component Value Date   ESRSEDRATE 33* 03/06/2014   ESRSEDRATE 16 12/21/2013     Lab Results  Component Value Date   PROBNP 164.0* 03/06/2014     Recent Labs Lab 03/06/14 1226  NA 142  K 4.0  CL 97  CO2 36*  BUN 23  CREATININE 1.1  GLUCOSE 204*    Recent Labs Lab 03/06/14 1226  HGB 13.5  HCT 41.6  WBC 15.4*  PLT 398.0        CXR  05/16/2014 :   COPD/emphysema. Decreased pulmonary edema pattern.   Mild lung base opacities; favor atelectasis/scarring. Trace residual pleural effusions.       Assessment & Plan:   Outpatient Encounter Prescriptions as of 11/15/2014  Medication Sig  . aspirin 81 MG tablet Take 81 mg by mouth daily.  . Calcium-Magnesium-Zinc (CAL-MAG-ZINC PO) Take 1 tablet by mouth  daily.   Marland Kitchen dextromethorphan-guaiFENesin (MUCINEX DM) 30-600 MG per 12 hr tablet Take 1-2 tablets by mouth every 12 (twelve) hours as needed (cough, congestion).  Marland Kitchen digoxin (LANOXIN) 0.125 MG tablet Take 1 tablet (0.125 mg total) by mouth daily.  . furosemide (LASIX) 40 MG tablet Take 40 mg by mouth daily. May take 1 extra daily as needed for leg swelling  . levothyroxine (SYNTHROID, LEVOTHROID) 75 MCG tablet TAKE 1 TABLET (75 MCG TOTAL) BY MOUTH DAILY.  Marland Kitchen losartan (COZAAR) 25 MG tablet Take 25 mg by mouth daily.   . metoprolol tartrate (LOPRESSOR) 25 MG tablet Take 1 tablet (25 mg total) by mouth 2 (two) times daily.  . Multiple Vitamin (MULTIVITAMIN) tablet Take 1 tablet by mouth daily.   . predniSONE (DELTASONE) 10 MG tablet Take  4 each am x 2 days,   2 each am x 2 days,  1 each am x 2 days and stop  . PROAIR HFA 108 (90 BASE) MCG/ACT inhaler Inhale 2 puffs into the lungs every 4 (four) hours as needed.  . simvastatin (ZOCOR) 80 MG tablet Take 1 tablet (80 mg total) by mouth at bedtime.  . tamsulosin (FLOMAX) 0.4 MG CAPS capsule Take 0.4 mg by mouth daily after breakfast.   . Umeclidinium-Vilanterol 62.5-25 MCG/INH AEPB Inhale 1 puff into the lungs daily.  Marland Kitchen warfarin (COUMADIN) 3 MG tablet Take as directed per the coumadin clinic

## 2014-11-16 ENCOUNTER — Encounter: Payer: Self-pay | Admitting: Internal Medicine

## 2014-11-16 NOTE — Assessment & Plan Note (Signed)
See admit 10/30/13 > d/c'd on 4lpm 24/7  - as of 12/21/2013 rec 4lpm 247 except fo 6 lpm with activity  - 02/07/2014 93% RA so rec 02 2lpm at hs and with activity - 03/02/2014 2 lpm 84% > corrected on 4lpm - 03/06/2014 HC03 36 c/w chronic hypercarbia as well - 05/16/2014  Walked 2lpm  x 3 laps @ 185 ft each stopped due to end of study moderate pace no desat - 08/15/2014   Walked RA x one lap @ 185 stopped due to sat 90% and nl pace  - 11/15/2014  Walked 2lpm x 3 laps @ 185 ft each stopped due to  End of study, erratic sats but with head probe did not desat on last one   rx as of  11/15/14 use 02 2lpm with sleep and exertion but not at rest/ walking around the house or to restaurants/ chur

## 2014-11-16 NOTE — Assessment & Plan Note (Signed)
-   PFT's 02/07/14  FEV1  0.69 (31%) ratio 41 no change p B2 and DLCO 30% - Trial of anoro 02/07/2014 > no better - trial of spiriva/ striverdi 03/02/2014 > improved 03/06/2014 > changed to anoro per formulary 05/16/2014 > improved 08/15/2014  - 08/15/2014 p extensive coaching HFA effectiveness =    75%   Severe but well compensated on present complex regimen  I had an extended discussion with the patient reviewing all relevant studies completed to date and  lasting 15 to 20 minutes of a 25 minute visit on the following ongoing concerns:    Each maintenance medication was reviewed in detail including most importantly the difference between maintenance and as needed and under what circumstances the prns are to be used. This was done in the context of a medication calendar review which provided the patient with a user-friendly unambiguous mechanism for medication administration and reconciliation and provides an action plan for all active problems. It is critical that this be shown to every doctor  for modification during the office visit if necessary so the patient can use it as a working document.

## 2014-11-19 ENCOUNTER — Encounter: Payer: Self-pay | Admitting: Internal Medicine

## 2014-11-24 ENCOUNTER — Inpatient Hospital Stay (HOSPITAL_COMMUNITY)
Admission: EM | Admit: 2014-11-24 | Discharge: 2014-12-01 | DRG: 871 | Disposition: A | Discharge status: Home Health | Payer: Medicare Other | Attending: Internal Medicine | Admitting: Internal Medicine

## 2014-11-24 ENCOUNTER — Emergency Department (HOSPITAL_COMMUNITY): Payer: Medicare Other

## 2014-11-24 ENCOUNTER — Encounter (HOSPITAL_COMMUNITY): Payer: Self-pay | Admitting: Emergency Medicine

## 2014-11-24 DIAGNOSIS — A419 Sepsis, unspecified organism: Secondary | ICD-10-CM | POA: Diagnosis not present

## 2014-11-24 DIAGNOSIS — J189 Pneumonia, unspecified organism: Secondary | ICD-10-CM | POA: Diagnosis present

## 2014-11-24 DIAGNOSIS — E038 Other specified hypothyroidism: Secondary | ICD-10-CM | POA: Diagnosis present

## 2014-11-24 DIAGNOSIS — I4891 Unspecified atrial fibrillation: Secondary | ICD-10-CM | POA: Diagnosis present

## 2014-11-24 DIAGNOSIS — J449 Chronic obstructive pulmonary disease, unspecified: Secondary | ICD-10-CM | POA: Diagnosis present

## 2014-11-24 DIAGNOSIS — Z9981 Dependence on supplemental oxygen: Secondary | ICD-10-CM

## 2014-11-24 DIAGNOSIS — I420 Dilated cardiomyopathy: Secondary | ICD-10-CM | POA: Diagnosis present

## 2014-11-24 DIAGNOSIS — J9622 Acute and chronic respiratory failure with hypercapnia: Secondary | ICD-10-CM | POA: Diagnosis present

## 2014-11-24 DIAGNOSIS — E039 Hypothyroidism, unspecified: Secondary | ICD-10-CM | POA: Diagnosis present

## 2014-11-24 DIAGNOSIS — I5032 Chronic diastolic (congestive) heart failure: Secondary | ICD-10-CM | POA: Diagnosis present

## 2014-11-24 DIAGNOSIS — I272 Other secondary pulmonary hypertension: Secondary | ICD-10-CM | POA: Diagnosis present

## 2014-11-24 DIAGNOSIS — J9602 Acute respiratory failure with hypercapnia: Secondary | ICD-10-CM

## 2014-11-24 DIAGNOSIS — J9611 Chronic respiratory failure with hypoxia: Secondary | ICD-10-CM | POA: Diagnosis present

## 2014-11-24 DIAGNOSIS — R0902 Hypoxemia: Secondary | ICD-10-CM | POA: Diagnosis not present

## 2014-11-24 DIAGNOSIS — I482 Chronic atrial fibrillation, unspecified: Secondary | ICD-10-CM | POA: Diagnosis present

## 2014-11-24 DIAGNOSIS — Z923 Personal history of irradiation: Secondary | ICD-10-CM

## 2014-11-24 DIAGNOSIS — Z7951 Long term (current) use of inhaled steroids: Secondary | ICD-10-CM

## 2014-11-24 DIAGNOSIS — J9621 Acute and chronic respiratory failure with hypoxia: Secondary | ICD-10-CM | POA: Diagnosis present

## 2014-11-24 DIAGNOSIS — Z87891 Personal history of nicotine dependence: Secondary | ICD-10-CM

## 2014-11-24 DIAGNOSIS — Z79899 Other long term (current) drug therapy: Secondary | ICD-10-CM

## 2014-11-24 DIAGNOSIS — Z7901 Long term (current) use of anticoagulants: Secondary | ICD-10-CM

## 2014-11-24 DIAGNOSIS — E785 Hyperlipidemia, unspecified: Secondary | ICD-10-CM | POA: Diagnosis present

## 2014-11-24 DIAGNOSIS — Z66 Do not resuscitate: Secondary | ICD-10-CM | POA: Diagnosis present

## 2014-11-24 DIAGNOSIS — J9601 Acute respiratory failure with hypoxia: Secondary | ICD-10-CM

## 2014-11-24 DIAGNOSIS — J441 Chronic obstructive pulmonary disease with (acute) exacerbation: Secondary | ICD-10-CM | POA: Diagnosis present

## 2014-11-24 DIAGNOSIS — Z8701 Personal history of pneumonia (recurrent): Secondary | ICD-10-CM

## 2014-11-24 DIAGNOSIS — Z8521 Personal history of malignant neoplasm of larynx: Secondary | ICD-10-CM

## 2014-11-24 DIAGNOSIS — Z7982 Long term (current) use of aspirin: Secondary | ICD-10-CM

## 2014-11-24 DIAGNOSIS — J9612 Chronic respiratory failure with hypercapnia: Secondary | ICD-10-CM

## 2014-11-24 LAB — I-STAT TROPONIN, ED: Troponin i, poc: 0 ng/mL (ref 0.00–0.08)

## 2014-11-24 LAB — I-STAT CG4 LACTIC ACID, ED: LACTIC ACID, VENOUS: 0.72 mmol/L (ref 0.5–2.0)

## 2014-11-24 MED ORDER — IPRATROPIUM-ALBUTEROL 0.5-2.5 (3) MG/3ML IN SOLN
3.0000 mL | Freq: Once | RESPIRATORY_TRACT | Status: AC
  Administered 2014-11-24: 3 mL via RESPIRATORY_TRACT
  Filled 2014-11-24: qty 3

## 2014-11-24 NOTE — ED Notes (Signed)
RT called for ABG.

## 2014-11-24 NOTE — ED Provider Notes (Signed)
CSN: [id number]     Arrival date & time 11/24/14  2316 History  This chart was scribed for Ephraim Hamburger, MD by Peyton Bottoms, ED Scribe. This patient was seen in room D30C/D30C and the patient's care was started at 11:36 PM.  Chief Complaint  Patient presents with  . Shortness of Breath   Patient is a 81 y.o. male presenting with shortness of breath. The history is provided by the patient and the spouse.  Shortness of Breath Severity:  Severe Onset quality:  Sudden Timing:  Constant Progression:  Improving Chronicity:  New Context: activity   Relieved by:  Nothing Worsened by:  Nothing tried Associated symptoms: cough     HPI Comments: Logan [name] is a 81 y.o. male with a PMHx of chronic atrial fibrillation, dilated cardiomyopathy, hypothyroidism, and CHF, who presents to the Emergency Department complaining of sudden onset of moderate shortness of breath that began earlier this evening. Per wife, patient was feeling tired today due to increased amount of walking. Patient states he has intermittent cough for the past 2 years. He currently reports associated rhinorrhea. He reports use of an inhaler and uses oxygen at level 2.  Patient reports history of Pneumonia in January. Per wife, patient had sudden onset of pneumonia symptoms and states that they were similar to the symptoms he presents today. He currently denies associated fever, chest pain or edema of legs. He denies PMHx of COPD or PE.   Past Medical History  Diagnosis Date  . Chronic atrial fibrillation   . Dilated cardiomyopathy   . PVC's (premature ventricular contractions)   . Hyperlipidemia   . COPD (chronic obstructive pulmonary disease)   . Hypothyroidism   . Renal calculi   . Chronic anticoagulation   . CHF (congestive heart failure)     DUE TO SYSTOLIC DYSFUNCTION  . Shortness of breath     exertional  . Cancer     vocal cord - radiation    Past Surgical History  Procedure Laterality Date  . Cardiac  catheterization  02/26/2007    EF 35-40%  . Appendectomy    . Tonsillectomy and adenoidectomy    . US echocardiography  07/08/2010    EF 50-55%  . US echocardiography  02/16/2007    EF 25-35%  . Transthoracic echocardiogram  08/26/2001    EF 25-30%  . Cardiovascular stress test  02/18/2007    EF 40%  . Microlaryngoscopy with co2 laser and excision of vocal cord lesion      15 years ago  . Lithotripsy    . Inguinal hernia repair Right 12/28/2012    Procedure: HERNIA REPAIR INGUINAL ADULT;  Surgeon: Joyice Faster. Cornett, MD;  Location: [facility];  Service: General;  Laterality: Right;   Family History  Problem Relation Age of Onset  . Stroke Mother   . Seizures Mother   . Heart attack Father   . Heart failure Father   . Heart attack Brother    History  Substance Use Topics  . Smoking status: Former Smoker -- 1.00 packs/day for 35 years    Types: Cigarettes    Quit date: 07/11/1961  . Smokeless tobacco: Not on file  . Alcohol Use: No   Review of Systems  HENT: Positive for rhinorrhea.   Respiratory: Positive for cough and shortness of breath.   Cardiovascular: Negative for leg swelling.  Gastrointestinal: Negative for nausea.  All other systems reviewed and are negative.  Allergies  Review of patient's allergies indicates  no known allergies.  Home Medications   Prior to Admission medications   Medication Sig Start Date End Date Taking? Authorizing Provider  aspirin 81 MG tablet Take 81 mg by mouth daily.    Historical Provider, MD  Calcium-Magnesium-Zinc (CAL-MAG-ZINC PO) Take 1 tablet by mouth daily.     Historical Provider, MD  dextromethorphan-guaiFENesin (MUCINEX DM) 30-600 MG per 12 hr tablet Take 1-2 tablets by mouth every 12 (twelve) hours as needed (cough, congestion).    Historical Provider, MD  digoxin (LANOXIN) 0.125 MG tablet Take 1 tablet (0.125 mg total) by mouth daily. 09/07/14   Peter M Martinique, MD  furosemide (LASIX) 40 MG tablet Take 40 mg by mouth daily. May take  1 extra daily as needed for leg swelling 03/27/14   Peter M Martinique, MD  levothyroxine (SYNTHROID, LEVOTHROID) 75 MCG tablet TAKE 1 TABLET (75 MCG TOTAL) BY MOUTH DAILY. 06/27/14   Peter M Martinique, MD  losartan (COZAAR) 25 MG tablet Take 25 mg by mouth daily.  12/14/13   Historical Provider, MD  metoprolol tartrate (LOPRESSOR) 25 MG tablet Take 1 tablet (25 mg total) by mouth 2 (two) times daily. 11/04/13   Patrecia Pour, MD  Multiple Vitamin (MULTIVITAMIN) tablet Take 1 tablet by mouth daily.     Historical Provider, MD  predniSONE (DELTASONE) 10 MG tablet Take  4 each am x 2 days,   2 each am x 2 days,  1 each am x 2 days and stop 08/15/14   Tanda Rockers, MD  PROAIR HFA 108 (90 BASE) MCG/ACT inhaler Inhale 2 puffs into the lungs every 4 (four) hours as needed. 03/02/14   Historical Provider, MD  simvastatin (ZOCOR) 80 MG tablet Take 1 tablet (80 mg total) by mouth at bedtime. 03/27/14   Peter M Martinique, MD  tamsulosin (FLOMAX) 0.4 MG CAPS capsule Take 0.4 mg by mouth daily after breakfast.     Historical Provider, MD  Umeclidinium-Vilanterol 62.5-25 MCG/INH AEPB Inhale 1 puff into the lungs daily. 05/16/14   Tanda Rockers, MD  warfarin (COUMADIN) 3 MG tablet Take as directed per the coumadin clinic 09/21/14   Peter M Martinique, MD   Triage Vitals: BP 140/86 mmHg  Pulse 102  Temp(Src) 98.5 F (36.9 C) (Oral)  Resp 24  SpO2 74%  Physical Exam  Constitutional: He is oriented to person, place, and time. He appears well-developed and well-nourished. No distress.  HENT:  Head: Normocephalic and atraumatic.  Eyes: Conjunctivae and EOM are normal.  Neck: Neck supple. No tracheal deviation present.  Cardiovascular: Normal rate.   Pulmonary/Chest: Effort normal. No respiratory distress.  Diffuse crackles.  Musculoskeletal: Normal range of motion. He exhibits edema.  No tenderness or edema in legs.  Neurological: He is alert and oriented to person, place, and time.  Skin: Skin is warm and dry.  Psychiatric:  He has a normal mood and affect. His behavior is normal.  Nursing note and vitals reviewed.  ED Course  Procedures (including critical care time)  DIAGNOSTIC STUDIES: Oxygen Saturation is 74% on [place], low by my interpretation.    COORDINATION OF CARE: 11:40 PM- Discussed plans to order diagnostic imaging of chest and lab work. Will give patient Rocephin, Zithromax and nebulizer breathing treatment. Patient states that his breathing has improved since given his first breathing treatment. Pt advised of plan for treatment and pt agrees.  Labs Review Labs Reviewed  CBC WITH DIFFERENTIAL/PLATELET - Abnormal; Notable for the following:    WBC 14.1 (*)  Neutrophils Relative % 79 (*)    Neutro Abs 11.2 (*)    Lymphocytes Relative 11 (*)    All other components within normal limits  COMPREHENSIVE METABOLIC PANEL - Abnormal; Notable for the following:    Glucose, Bld 173 (*)    GFR calc non Af Amer 71 (*)    GFR calc Af Amer 83 (*)    All other components within normal limits  PROTIME-INR - Abnormal; Notable for the following:    Prothrombin Time 27.1 (*)    INR 2.49 (*)    All other components within normal limits  I-STAT ARTERIAL BLOOD GAS, ED - Abnormal; Notable for the following:    pH, Arterial 7.299 (*)    pCO2 arterial 67.3 (*)    pO2, Arterial 191.0 (*)    Bicarbonate 33.1 (*)    Acid-Base Excess 5.0 (*)    All other components within normal limits  CULTURE, BLOOD (ROUTINE X 2)  CULTURE, BLOOD (ROUTINE X 2)  BRAIN NATRIURETIC PEPTIDE  I-STAT TROPOININ, ED  I-STAT CG4 LACTIC ACID, ED   Imaging Review Dg Chest Portable 1 View  11/25/2014   CLINICAL DATA:  Shortness of breath today.  EXAM: PORTABLE CHEST - 1 VIEW  COMPARISON:  05/16/2014  FINDINGS: The heart is mildly enlarged but stable. There is tortuosity and calcification of the thoracic aorta. Severe emphysematous changes with superimposed asymmetric pulmonary edema or pulmonary infiltrates. No definite pleural effusions.  The bony thorax is intact.  IMPRESSION: Underlying emphysematous changes with superimposed asymmetric airspace process, asymmetric pulmonary edema versus infiltrates.   Electronically Signed   By: Kalman Jewels M.D.   On: 11/25/2014 00:08     EKG Interpretation   Date/Time:  Friday November 24 2014 23:23:08 EST Ventricular Rate:  104 PR Interval:    QRS Duration: 80 QT Interval:  314 QTC Calculation: 412 R Axis:   90 Text Interpretation:  Atrial fibrillation with rapid ventricular response  Rightward axis Anteroseptal infarct , age undetermined Abnormal ECG no  significant change since Jan 2015 Confirmed by Regenia Skeeter  MD, Richelle Glick [phone])  on 11/24/2014 11:53:29 PM      CRITICAL CARE Performed by: Sherwood Gambler T   Total critical care time: 30 minutes  Critical care time was exclusive of separately billable procedures and treating other patients.  Critical care was necessary to treat or prevent imminent or life-threatening deterioration.  Critical care was time spent personally by me on the following activities: development of treatment plan with patient and/or surrogate as well as nursing, discussions with consultants, evaluation of patient's response to treatment, examination of patient, obtaining history from patient or surrogate, ordering and performing treatments and interventions, ordering and review of laboratory studies, ordering and review of radiographic studies, pulse oximetry and re-evaluation of patient's condition.  MDM   Final diagnoses:  CAP (community acquired pneumonia)  Hypoxia  Acute respiratory failure with hypoxia and hypercarbia    Patient with CAP with hypoxia. Normally only on 2L, requiring NRB until given BiPAP. Is DNR, does not want intubation. No hypotension or signs of severe sepsis. Given CAP antibiotics. Unable to wean from BiPAP, will need stepdown admission.   I personally performed the services described in this documentation, which was scribed  in my presence. The recorded information has been reviewed and is accurate.   Ephraim Hamburger, MD 11/25/14 [phone]

## 2014-11-24 NOTE — ED Notes (Signed)
MD at bedside. 

## 2014-11-24 NOTE — ED Notes (Signed)
Pt. reports worsening SOB with productive cough this week with audible rales and crackles on both lung fields , O2 sat = 74 % 2 lpm/[place] at triage . Denies chest pain / no fever or chills.

## 2014-11-24 NOTE — ED Notes (Signed)
XRAY called for stat portable chest xray.

## 2014-11-25 DIAGNOSIS — J9622 Acute and chronic respiratory failure with hypercapnia: Secondary | ICD-10-CM | POA: Diagnosis present

## 2014-11-25 DIAGNOSIS — I482 Chronic atrial fibrillation: Secondary | ICD-10-CM | POA: Diagnosis present

## 2014-11-25 DIAGNOSIS — Z8701 Personal history of pneumonia (recurrent): Secondary | ICD-10-CM | POA: Diagnosis not present

## 2014-11-25 DIAGNOSIS — Z7901 Long term (current) use of anticoagulants: Secondary | ICD-10-CM | POA: Diagnosis not present

## 2014-11-25 DIAGNOSIS — I420 Dilated cardiomyopathy: Secondary | ICD-10-CM | POA: Diagnosis present

## 2014-11-25 DIAGNOSIS — E039 Hypothyroidism, unspecified: Secondary | ICD-10-CM | POA: Diagnosis present

## 2014-11-25 DIAGNOSIS — I5032 Chronic diastolic (congestive) heart failure: Secondary | ICD-10-CM | POA: Diagnosis present

## 2014-11-25 DIAGNOSIS — I272 Other secondary pulmonary hypertension: Secondary | ICD-10-CM | POA: Diagnosis present

## 2014-11-25 DIAGNOSIS — J441 Chronic obstructive pulmonary disease with (acute) exacerbation: Secondary | ICD-10-CM | POA: Diagnosis present

## 2014-11-25 DIAGNOSIS — Z7982 Long term (current) use of aspirin: Secondary | ICD-10-CM | POA: Diagnosis not present

## 2014-11-25 DIAGNOSIS — J9621 Acute and chronic respiratory failure with hypoxia: Secondary | ICD-10-CM | POA: Diagnosis present

## 2014-11-25 DIAGNOSIS — Z7951 Long term (current) use of inhaled steroids: Secondary | ICD-10-CM | POA: Diagnosis not present

## 2014-11-25 DIAGNOSIS — J9601 Acute respiratory failure with hypoxia: Secondary | ICD-10-CM | POA: Insufficient documentation

## 2014-11-25 DIAGNOSIS — Z79899 Other long term (current) drug therapy: Secondary | ICD-10-CM | POA: Diagnosis not present

## 2014-11-25 DIAGNOSIS — R0902 Hypoxemia: Secondary | ICD-10-CM | POA: Insufficient documentation

## 2014-11-25 DIAGNOSIS — J189 Pneumonia, unspecified organism: Secondary | ICD-10-CM | POA: Diagnosis present

## 2014-11-25 DIAGNOSIS — Z87891 Personal history of nicotine dependence: Secondary | ICD-10-CM | POA: Diagnosis not present

## 2014-11-25 DIAGNOSIS — Z9981 Dependence on supplemental oxygen: Secondary | ICD-10-CM | POA: Diagnosis not present

## 2014-11-25 DIAGNOSIS — E785 Hyperlipidemia, unspecified: Secondary | ICD-10-CM | POA: Diagnosis present

## 2014-11-25 DIAGNOSIS — A419 Sepsis, unspecified organism: Secondary | ICD-10-CM | POA: Diagnosis present

## 2014-11-25 DIAGNOSIS — J9602 Acute respiratory failure with hypercapnia: Secondary | ICD-10-CM

## 2014-11-25 DIAGNOSIS — Z66 Do not resuscitate: Secondary | ICD-10-CM | POA: Diagnosis present

## 2014-11-25 DIAGNOSIS — Z923 Personal history of irradiation: Secondary | ICD-10-CM | POA: Diagnosis not present

## 2014-11-25 DIAGNOSIS — Z8521 Personal history of malignant neoplasm of larynx: Secondary | ICD-10-CM | POA: Diagnosis not present

## 2014-11-25 LAB — I-STAT ARTERIAL BLOOD GAS, ED
ACID-BASE EXCESS: 5 mmol/L — AB (ref 0.0–2.0)
Bicarbonate: 33.1 mEq/L — ABNORMAL HIGH (ref 20.0–24.0)
O2 SAT: 100 %
PCO2 ART: 67.3 mmHg — AB (ref 35.0–45.0)
PH ART: 7.299 — AB (ref 7.350–7.450)
Patient temperature: 98.6
TCO2: 35 mmol/L (ref 0–100)
pO2, Arterial: 191 mmHg — ABNORMAL HIGH (ref 80.0–100.0)

## 2014-11-25 LAB — CBC WITH DIFFERENTIAL/PLATELET
BASOS PCT: 0 % (ref 0–1)
Basophils Absolute: 0.1 10*3/uL (ref 0.0–0.1)
EOS ABS: 0.4 10*3/uL (ref 0.0–0.7)
EOS PCT: 3 % (ref 0–5)
HEMATOCRIT: 41.8 % (ref 39.0–52.0)
HEMOGLOBIN: 13.8 g/dL (ref 13.0–17.0)
LYMPHS ABS: 1.5 10*3/uL (ref 0.7–4.0)
LYMPHS PCT: 11 % — AB (ref 12–46)
MCH: 32.1 pg (ref 26.0–34.0)
MCHC: 33 g/dL (ref 30.0–36.0)
MCV: 97.2 fL (ref 78.0–100.0)
MONO ABS: 1 10*3/uL (ref 0.1–1.0)
MONOS PCT: 7 % (ref 3–12)
Neutro Abs: 11.2 10*3/uL — ABNORMAL HIGH (ref 1.7–7.7)
Neutrophils Relative %: 79 % — ABNORMAL HIGH (ref 43–77)
Platelets: 324 10*3/uL (ref 150–400)
RBC: 4.3 MIL/uL (ref 4.22–5.81)
RDW: 14.5 % (ref 11.5–15.5)
WBC: 14.1 10*3/uL — ABNORMAL HIGH (ref 4.0–10.5)

## 2014-11-25 LAB — BASIC METABOLIC PANEL
Anion gap: 4 — ABNORMAL LOW (ref 5–15)
BUN: 16 mg/dL (ref 6–23)
CO2: 34 mmol/L — AB (ref 19–32)
CREATININE: 0.86 mg/dL (ref 0.50–1.35)
Calcium: 8.8 mg/dL (ref 8.4–10.5)
Chloride: 101 mmol/L (ref 96–112)
GFR calc Af Amer: 87 mL/min — ABNORMAL LOW (ref >90)
GFR calc non Af Amer: 75 mL/min — ABNORMAL LOW (ref >90)
Glucose, Bld: 149 mg/dL — ABNORMAL HIGH (ref 70–99)
POTASSIUM: 4.5 mmol/L (ref 3.5–5.1)
SODIUM: 139 mmol/L (ref 135–145)

## 2014-11-25 LAB — COMPREHENSIVE METABOLIC PANEL WITH GFR
ALT: 15 U/L (ref 0–53)
AST: 23 U/L (ref 0–37)
Albumin: 3.5 g/dL (ref 3.5–5.2)
Alkaline Phosphatase: 96 U/L (ref 39–117)
Anion gap: 7 (ref 5–15)
BUN: 21 mg/dL (ref 6–23)
CO2: 32 mmol/L (ref 19–32)
Calcium: 9.2 mg/dL (ref 8.4–10.5)
Chloride: 101 mmol/L (ref 96–112)
Creatinine, Ser: 0.98 mg/dL (ref 0.50–1.35)
GFR calc Af Amer: 83 mL/min — ABNORMAL LOW
GFR calc non Af Amer: 71 mL/min — ABNORMAL LOW
Glucose, Bld: 173 mg/dL — ABNORMAL HIGH (ref 70–99)
Potassium: 4.1 mmol/L (ref 3.5–5.1)
Sodium: 140 mmol/L (ref 135–145)
Total Bilirubin: 0.6 mg/dL (ref 0.3–1.2)
Total Protein: 7.1 g/dL (ref 6.0–8.3)

## 2014-11-25 LAB — PROTIME-INR
INR: 2.49 — AB (ref 0.00–1.49)
PROTHROMBIN TIME: 27.1 s — AB (ref 11.6–15.2)

## 2014-11-25 LAB — CBC
HCT: 38 % — ABNORMAL LOW (ref 39.0–52.0)
Hemoglobin: 12.4 g/dL — ABNORMAL LOW (ref 13.0–17.0)
MCH: 31.4 pg (ref 26.0–34.0)
MCHC: 32.6 g/dL (ref 30.0–36.0)
MCV: 96.2 fL (ref 78.0–100.0)
PLATELETS: 272 10*3/uL (ref 150–400)
RBC: 3.95 MIL/uL — ABNORMAL LOW (ref 4.22–5.81)
RDW: 14.2 % (ref 11.5–15.5)
WBC: 19.1 10*3/uL — ABNORMAL HIGH (ref 4.0–10.5)

## 2014-11-25 LAB — BRAIN NATRIURETIC PEPTIDE: B Natriuretic Peptide: 79.8 pg/mL (ref 0.0–100.0)

## 2014-11-25 LAB — MRSA PCR SCREENING: MRSA BY PCR: NEGATIVE

## 2014-11-25 LAB — I-STAT CG4 LACTIC ACID, ED
LACTIC ACID, VENOUS: 1.63 mmol/L (ref 0.5–2.0)
Lactic Acid, Venous: 1.45 mmol/L (ref 0.5–2.0)

## 2014-11-25 MED ORDER — VANCOMYCIN HCL 500 MG IV SOLR
500.0000 mg | Freq: Two times a day (BID) | INTRAVENOUS | Status: DC
  Filled 2014-11-25: qty 500

## 2014-11-25 MED ORDER — IPRATROPIUM-ALBUTEROL 0.5-2.5 (3) MG/3ML IN SOLN
3.0000 mL | RESPIRATORY_TRACT | Status: DC
  Administered 2014-11-25 (×3): 3 mL via RESPIRATORY_TRACT
  Filled 2014-11-25 (×2): qty 3

## 2014-11-25 MED ORDER — SODIUM CHLORIDE 0.9 % IV SOLN: 250.0000 mL | INTRAVENOUS | Status: DC | PRN

## 2014-11-25 MED ORDER — DEXTROSE 5 % IV SOLN
1.0000 g | Freq: Once | INTRAVENOUS | Status: AC
  Administered 2014-11-25: 1 g via INTRAVENOUS
  Filled 2014-11-25: qty 10

## 2014-11-25 MED ORDER — ASPIRIN 81 MG PO TABS: 81.0000 mg | ORAL_TABLET | Freq: Every day | ORAL | Status: DC

## 2014-11-25 MED ORDER — DEXTROSE 5 % IV SOLN
500.0000 mg | INTRAVENOUS | Status: DC
  Administered 2014-11-25 – 2014-11-28 (×4): 500 mg via INTRAVENOUS
  Filled 2014-11-25 (×4): qty 500

## 2014-11-25 MED ORDER — DEXTROSE 5 % IV SOLN
1.0000 g | INTRAVENOUS | Status: DC
  Administered 2014-11-25 – 2014-11-28 (×4): 1 g via INTRAVENOUS
  Filled 2014-11-25 (×5): qty 10

## 2014-11-25 MED ORDER — ACETAMINOPHEN 325 MG PO TABS: 650.0000 mg | ORAL_TABLET | Freq: Four times a day (QID) | ORAL | Status: DC | PRN

## 2014-11-25 MED ORDER — SODIUM CHLORIDE 0.9 % IV BOLUS (SEPSIS)
500.0000 mL | Freq: Once | INTRAVENOUS | Status: AC
  Administered 2014-11-25: 500 mL via INTRAVENOUS

## 2014-11-25 MED ORDER — VANCOMYCIN HCL IN DEXTROSE 1-5 GM/200ML-% IV SOLN
1000.0000 mg | Freq: Once | INTRAVENOUS | Status: AC
  Administered 2014-11-25: 1000 mg via INTRAVENOUS
  Filled 2014-11-25: qty 200

## 2014-11-25 MED ORDER — SODIUM CHLORIDE 0.9 % IV SOLN
Freq: Once | INTRAVENOUS | Status: AC
  Administered 2014-11-25: 07:00:00 via INTRAVENOUS

## 2014-11-25 MED ORDER — DEXTROSE 5 % IV SOLN
2.0000 g | INTRAVENOUS | Status: DC
  Administered 2014-11-25: 2 g via INTRAVENOUS
  Filled 2014-11-25: qty 2

## 2014-11-25 MED ORDER — SODIUM CHLORIDE 0.9 % IV SOLN
INTRAVENOUS | Status: AC
  Administered 2014-11-25 – 2014-11-26 (×3): via INTRAVENOUS

## 2014-11-25 MED ORDER — SODIUM CHLORIDE 0.9 % IJ SOLN: 3.0000 mL | Freq: Two times a day (BID) | INTRAMUSCULAR | Status: DC

## 2014-11-25 MED ORDER — DEXTROSE 5 % IV SOLN: 1.0000 g | INTRAVENOUS | Status: DC

## 2014-11-25 MED ORDER — IPRATROPIUM-ALBUTEROL 0.5-2.5 (3) MG/3ML IN SOLN
3.0000 mL | Freq: Four times a day (QID) | RESPIRATORY_TRACT | Status: DC
  Administered 2014-11-25 – 2014-11-28 (×10): 3 mL via RESPIRATORY_TRACT
  Filled 2014-11-25 (×9): qty 3

## 2014-11-25 MED ORDER — IPRATROPIUM-ALBUTEROL 0.5-2.5 (3) MG/3ML IN SOLN
RESPIRATORY_TRACT | Status: AC
  Administered 2014-11-25: 08:00:00
  Filled 2014-11-25: qty 3

## 2014-11-25 MED ORDER — ONDANSETRON HCL 4 MG/2ML IJ SOLN: 4.0000 mg | Freq: Four times a day (QID) | INTRAMUSCULAR | Status: DC | PRN

## 2014-11-25 MED ORDER — PANTOPRAZOLE SODIUM 40 MG IV SOLR
40.0000 mg | INTRAVENOUS | Status: DC
  Administered 2014-11-25: 40 mg via INTRAVENOUS
  Filled 2014-11-25: qty 40

## 2014-11-25 MED ORDER — SODIUM CHLORIDE 0.9 % IJ SOLN: 3.0000 mL | INTRAMUSCULAR | Status: DC | PRN

## 2014-11-25 MED ORDER — AZITHROMYCIN 500 MG IV SOLR
500.0000 mg | Freq: Once | INTRAVENOUS | Status: AC
  Administered 2014-11-25: 500 mg via INTRAVENOUS
  Filled 2014-11-25: qty 500

## 2014-11-25 MED ORDER — DIGOXIN 125 MCG PO TABS: 0.1250 mg | ORAL_TABLET | Freq: Every day | ORAL | Status: DC

## 2014-11-25 MED ORDER — OXYCODONE HCL 5 MG PO TABS: 5.0000 mg | ORAL_TABLET | ORAL | Status: DC | PRN

## 2014-11-25 MED ORDER — HYDROMORPHONE HCL 1 MG/ML IJ SOLN: 0.5000 mg | INTRAMUSCULAR | Status: DC | PRN

## 2014-11-25 MED ORDER — ALUM & MAG HYDROXIDE-SIMETH 200-200-20 MG/5ML PO SUSP: 30.0000 mL | Freq: Four times a day (QID) | ORAL | Status: DC | PRN

## 2014-11-25 MED ORDER — LEVOTHYROXINE SODIUM 75 MCG PO TABS: 75.0000 ug | ORAL_TABLET | Freq: Every day | ORAL | Status: DC

## 2014-11-25 MED ORDER — ONDANSETRON HCL 4 MG PO TABS: 4.0000 mg | ORAL_TABLET | Freq: Four times a day (QID) | ORAL | Status: DC | PRN

## 2014-11-25 MED ORDER — ACETAMINOPHEN 650 MG RE SUPP
650.0000 mg | Freq: Four times a day (QID) | RECTAL | Status: DC | PRN
  Administered 2014-11-25: 650 mg via RECTAL
  Filled 2014-11-25: qty 1

## 2014-11-25 MED ORDER — DEXTROSE 5 % IV SOLN: 500.0000 mg | INTRAVENOUS | Status: DC

## 2014-11-25 NOTE — ED Notes (Signed)
Pt 99% on 4 L [place]. Pt denies SOB. Coughing up yellow sputum.

## 2014-11-25 NOTE — ED Notes (Signed)
MD and RT informed of pt's VS on 4L 02.

## 2014-11-25 NOTE — H&P (Signed)
[facility] Admission History and Physical       Logan [name] FUX:[id number] DOB: December 24, 1924 DOA: 11/24/2014  Referring physician:  EDP PCP: Leonard Downing, MD  Specialists:   Chief Complaint: SOB  HPI: Logan [name] is a 81 y.o. male with a history of O2 Dependent COPD, Chronic Atrial Fibrillation, Systolic CHF, Dilated Cardiomyopathy, Hyperlipidemia, Hypothyroid, and previous Vocal Cord Cancer who presents to the ED with complaints of worsening SOB and Cough X 1 day.  He denies fever and chills, however he reports having weakness and DOE x 1 month.    He was found to have hypoemia in the ED, and required BIPAP, and a Chest X-ray revealed Asymmetric Pulmonary edema versus Infiltrates.   He also had a leukocytosis of 14.1and he was placed on IV Rocephin and Azithromycin initially.     Review of Systems:  Constitutional: No Weight Loss, No Weight Gain, Night Sweats, Fevers, Chills, Dizziness, Fatigue, or Generalized Weakness HEENT: No Headaches, Difficulty Swallowing,Tooth/Dental Problems,Sore Throat,  No Sneezing, Rhinitis, Ear Ache, Nasal Congestion, or Post Nasal Drip,  Cardio-vascular:  No Chest pain, Orthopnea, PND, Edema in Lower Extremities, Anasarca, Dizziness, Palpitations  Resp:   +Dyspnea, No DOE, +Non-Productive Cough, No Hemoptysis, No Wheezing.    GI: No Heartburn, Indigestion, Abdominal Pain, Nausea, Vomiting, Diarrhea, Hematemesis, Hematochezia, Melena, Change in Bowel Habits,  Loss of Appetite  GU: No Dysuria, Change in Color of Urine, No Urgency or Frequency, No Flank pain.  Musculoskeletal: No Joint Pain or Swelling, No Decreased Range of Motion, No Back Pain.  Neurologic: No Syncope, No Seizures, Muscle Weakness, Paresthesia, Vision Disturbance or Loss, No Diplopia, No Vertigo, No Difficulty Walking,  Skin: No Rash or Lesions. Psych: No Change in Mood or Affect, No Depression or Anxiety, No Memory loss, No Confusion, or Hallucinations   Past  Medical History  Diagnosis Date  . Chronic atrial fibrillation   . Dilated cardiomyopathy   . PVC's (premature ventricular contractions)   . Hyperlipidemia   . COPD (chronic obstructive pulmonary disease)   . Hypothyroidism   . Renal calculi   . Chronic anticoagulation   . CHF (congestive heart failure)     DUE TO SYSTOLIC DYSFUNCTION  . Shortness of breath     exertional  . Cancer     vocal cord - radiation      Past Surgical History  Procedure Laterality Date  . Cardiac catheterization  02/26/2007    EF 35-40%  . Appendectomy    . Tonsillectomy and adenoidectomy    . US echocardiography  07/08/2010    EF 50-55%  . US echocardiography  02/16/2007    EF 25-35%  . Transthoracic echocardiogram  08/26/2001    EF 25-30%  . Cardiovascular stress test  02/18/2007    EF 40%  . Microlaryngoscopy with co2 laser and excision of vocal cord lesion      15 years ago  . Lithotripsy    . Inguinal hernia repair Right 12/28/2012    Procedure: HERNIA REPAIR INGUINAL ADULT;  Surgeon: Joyice Faster. Cornett, MD;  Location: [facility];  Service: General;  Laterality: Right;      Prior to Admission medications   Medication Sig Start Date End Date Taking? Authorizing Provider  aspirin 81 MG tablet Take 81 mg by mouth daily.   Yes Historical Provider, MD  Calcium-Magnesium-Zinc (CAL-MAG-ZINC PO) Take 1 tablet by mouth daily.    Yes Historical Provider, MD  dextromethorphan-guaiFENesin (MUCINEX DM) 30-600 MG per 12 hr tablet Take 1-2  tablets by mouth every 12 (twelve) hours as needed (cough, congestion).   Yes Historical Provider, MD  digoxin (LANOXIN) 0.125 MG tablet Take 1 tablet (0.125 mg total) by mouth daily. 09/07/14  Yes Peter M Martinique, MD  furosemide (LASIX) 40 MG tablet Take 40 mg by mouth daily. May take 1 extra daily as needed for leg swelling 03/27/14  Yes Peter M Martinique, MD  levothyroxine (SYNTHROID, LEVOTHROID) 75 MCG tablet TAKE 1 TABLET (75 MCG TOTAL) BY MOUTH DAILY. 06/27/14  Yes Peter M Martinique,  MD  losartan (COZAAR) 25 MG tablet Take 25 mg by mouth daily.  12/14/13  Yes Historical Provider, MD  metoprolol tartrate (LOPRESSOR) 25 MG tablet Take 1 tablet (25 mg total) by mouth 2 (two) times daily. 11/04/13  Yes Patrecia Pour, MD  Multiple Vitamin (MULTIVITAMIN) tablet Take 1 tablet by mouth daily.    Yes Historical Provider, MD  PROAIR HFA 108 (90 BASE) MCG/ACT inhaler Inhale 2 puffs into the lungs every 4 (four) hours as needed for wheezing or shortness of breath.  03/02/14  Yes Historical Provider, MD  simvastatin (ZOCOR) 80 MG tablet Take 1 tablet (80 mg total) by mouth at bedtime. 03/27/14  Yes Peter M Martinique, MD  tamsulosin (FLOMAX) 0.4 MG CAPS capsule Take 0.4 mg by mouth daily after breakfast.    Yes Historical Provider, MD  Umeclidinium-Vilanterol 62.5-25 MCG/INH AEPB Inhale 1 puff into the lungs daily. 05/16/14  Yes Tanda Rockers, MD  warfarin (COUMADIN) 3 MG tablet Take as directed per the coumadin clinic Patient taking differently: Take 3-4.5 mg by mouth daily. Take 3mg  on Wednesday and Sunday. All other days take 4.5mg . 09/21/14  Yes Peter M Martinique, MD  predniSONE (DELTASONE) 10 MG tablet Take  4 each am x 2 days,   2 each am x 2 days,  1 each am x 2 days and stop Patient not taking: Reported on 11/25/2014 08/15/14   Tanda Rockers, MD     No Known Allergies  Social History:  reports that he quit smoking about 53 years ago. His smoking use included Cigarettes. He has a 35 pack-year smoking history. He does not have any smokeless tobacco history on file. He reports that he does not drink alcohol or use illicit drugs.    Family History  Problem Relation Age of Onset  . Stroke Mother   . Seizures Mother   . Heart attack Father   . Heart failure Father   . Heart attack Brother        Physical Exam:  GEN:  [place] Elderly Thin  81 y.o. Caucasian male  examined  and in no acute distress; cooperative with exam Filed Vitals:   11/25/14 0330 11/25/14 0345 11/25/14 0400 11/25/14  0415  BP: 116/56 102/43 116/57 119/55  Pulse: 30 70 112 97  Temp:      TempSrc:      Resp: 28 30 24 28   SpO2: 98% 97% 99% 98%   Blood pressure 119/55, pulse 97, temperature 98.5 F (36.9 C), temperature source Oral, resp. rate 28, SpO2 98 %. PSYCH: He is alert and oriented x4; does not appear anxious does not appear depressed; affect is normal HEENT: Normocephalic and Atraumatic, Mucous membranes pink; PERRLA; EOM intact; Fundi:  Benign;  No scleral icterus, Nares: Patent, Oropharynx: Clear,  Breasts:: Not examined CHEST WALL: No tenderness CHEST: Normal respiration, clear to auscultation bilaterally HEART: Regular rate and rhythm; no murmurs rubs or gallops BACK: No kyphosis or scoliosis; No CVA  tenderness ABDOMEN: Positive Bowel Sounds, Soft Non-Tender; No Masses, No Organomegaly. Rectal Exam: Not done EXTREMITIES: No  Cyanosis, Clubbing, or Edema; No Ulcerations. Genitalia: not examined PULSES: 2+ and symmetric SKIN: Normal hydration no rash or ulceration CNS:  Alert and Oriented x  4 with Mild Confusion No Focal Deficits  Vascular: pulses palpable throughout    Labs on Admission:  Basic Metabolic Panel:  Recent Labs Lab 11/24/14 2340  NA 140  K 4.1  CL 101  CO2 32  GLUCOSE 173*  BUN 21  CREATININE 0.98  CALCIUM 9.2   Liver Function Tests:  Recent Labs Lab 11/24/14 2340  AST 23  ALT 15  ALKPHOS 96  BILITOT 0.6  PROT 7.1  ALBUMIN 3.5   No results for input(s): LIPASE, AMYLASE in the last 168 hours. No results for input(s): AMMONIA in the last 168 hours. CBC:  Recent Labs Lab 11/24/14 2340  WBC 14.1*  NEUTROABS 11.2*  HGB 13.8  HCT 41.8  MCV 97.2  PLT 324   Cardiac Enzymes: No results for input(s): CKTOTAL, CKMB, CKMBINDEX, TROPONINI in the last 168 hours.  BNP (last 3 results)  Recent Labs  11/24/14 2340  BNP 79.8    ProBNP (last 3 results)  Recent Labs  12/21/13 1049 03/06/14 1226  PROBNP 206.0* 164.0*    CBG: No results for  input(s): GLUCAP in the last 168 hours.  Radiological Exams on Admission: Dg Chest Portable 1 View  11/25/2014   CLINICAL DATA:  Shortness of breath today.  EXAM: PORTABLE CHEST - 1 VIEW  COMPARISON:  05/16/2014  FINDINGS: The heart is mildly enlarged but stable. There is tortuosity and calcification of the thoracic aorta. Severe emphysematous changes with superimposed asymmetric pulmonary edema or pulmonary infiltrates. No definite pleural effusions. The bony thorax is intact.  IMPRESSION: Underlying emphysematous changes with superimposed asymmetric airspace process, asymmetric pulmonary edema versus infiltrates.   Electronically Signed   By: Kalman Jewels M.D.   On: 11/25/2014 00:08     EKG: Independently reviewed.    Assessment/Plan:    81 y.o. male with  Principal Problem:   1.    Sepsis   Blood Culture x 2   Stepdown Monitoring   IV Vancomycin and Cefepime   Duonebs    BiPAP/O2   Monitor O2 sats    Active Problems:   2.    Chronic respiratory failure with hypercapnia   BIPAP   Monitor O2 sats     3.    CAP (community acquired pneumonia)   Changed Abx coverage to IV Vancomycin and Cefepime due to Hypotension, and increased Leukocytosis WBC =14.1 to 19.1     4.    Chronic diastolic CHF (congestive heart failure)Dilated cardiomyopathy   Monitor for decompensation     5.    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    Chronic atrial fibrillation- PO meds on hold overnight due to BIPAP Rx   On Metoprolol, for rate control   On  Coumadin      7.    Hyperlipidemia   On Statin Rx     8.      Hypothyroidism   On Levothyroxine Restart when taking PO     9.  Chronic anticoagulation   Restart Coumadin when taking PO   PT?INR daily     Code Status:    DO  NOT RESUSCITATE ( DNR) Family Communication:    No Family Present  Disposition Plan:     Inpatient    Time spent:  [facility] Pager  [phone]   If [place] Please Contact the Day Rounding Team MD for [facility]  If 7PM-7AM, Please Contact Night-Floor Coverage  [email or web address] Password [email or web address] 11/25/2014, 4:19 AM     ADDENDUM:   Patient was seen and examined on 11/25/2014

## 2014-11-25 NOTE — Progress Notes (Signed)
ANTIBIOTIC CONSULT NOTE - INITIAL  Pharmacy Consult for Vancomycin/Cefepime  Indication: rule out sepsis  No Known Allergies  Patient Measurements: ~60 kg  Vital Signs: Temp: 100.9 F (38.3 C) (02/06 0440) Temp Source: Rectal (02/06 0440) BP: 112/55 mmHg (02/06 0430) Pulse Rate: 110 (02/06 0436) Intake/Output from previous day: 02/05 0701 - 02/06 0700 In: 300 [I.V.:300] Out: 150 [Urine:150] Intake/Output from this shift: Total I/O In: 300 [I.V.:300] Out: 150 [Urine:150]  Labs:  Recent Labs  11/24/14 2340 11/25/14 0412  WBC 14.1* 19.1*  HGB 13.8 12.4*  PLT 324 272  CREATININE 0.98  --    Estimated Creatinine Clearance: 44.6 mL/min (by C-G formula based on Cr of 0.98).  Medical History: Past Medical History  Diagnosis Date  . Chronic atrial fibrillation   . Dilated cardiomyopathy   . PVC's (premature ventricular contractions)   . Hyperlipidemia   . COPD (chronic obstructive pulmonary disease)   . Hypothyroidism   . Renal calculi   . Chronic anticoagulation   . CHF (congestive heart failure)     DUE TO SYSTOLIC DYSFUNCTION  . Shortness of breath     exertional  . Cancer     vocal cord - radiation     Assessment: 81 y/o with shortness of breath, possible infiltrates on CXR, WBC is elevated, renal function age appropriate, other labs as above.   Goal of Therapy:  Vancomycin trough level 15-20 mcg/ml  Plan:  -Vancomycin 500 mg IV q12h -Cefepime 2g IV q24h -Trend WBC, temp, renal function  -Drug levels as indicated   Narda Bonds 11/25/2014,4:49 AM

## 2014-11-25 NOTE — ED Notes (Signed)
Admitting MD at BS.  

## 2014-11-25 NOTE — ED Notes (Signed)
MD at bedside. 

## 2014-11-25 NOTE — Progress Notes (Signed)
  [place] TEAM 1 - Stepdown/ICU TEAM Progress Note  Logan [name] [id number] DOB: 02/22/1925 DOA: 11/24/2014 PCP: Leonard Downing, MD  Admit HPI / Brief Narrative: 81 y.o. male with a history of O2 Dependent COPD, Chronic Atrial Fibrillation, Systolic CHF, Dilated Cardiomyopathy, Hyperlipidemia, Hypothyroid, and previous Vocal Cord Cancer who presented to the ED with complaints of worsening SOB and cough X 1 day. He denied fever and chills, however he reported having weakness and DOE x 1 month. He was found to have hypoxemia in the ED, and required BIPAP.  A CXR revealed asymmetric pulmonary edema versus infiltrates. He had a leukocytosis of 14.1and he was placed on IV Rocephin and Azithromycin initially.   HPI/Subjective: Pt seen for f/u visit.    Assessment/Plan:  Sepsis due to CAP WBC 14 - HR 112 - lactic acid normal   COPD w/ acute on chronic hypoxic and hypercarbic resp failure  Quit smoking in 1992 - wears 2L when exerting himself or QHS   Chronic diastolic CHF - recovered dilated CM EF 50-55% via TTE Sept 2015 - careful volume resuscitation  Chronic atrial fibrillation on anticoagulation INR 2.49  HLD  Hypothyroid   Hx of laryngeal CA s/p XRT  Code Status: FULL Family Communication: no family present at time of exam Disposition Plan: SDU  Consultants: none  Procedures: none  Antibiotics: Vanc 2/5 Cefepime 2/5 Azithro 2/5 > rocpehin 2/5 >  DVT prophylaxis: Warfarin   Objective: Blood pressure 106/52, pulse 105, temperature 99.5 F (37.5 C), temperature source Rectal, resp. rate 27, SpO2 100 %.  Intake/Output Summary (Last 24 hours) at 11/25/14 0824 Last data filed at 11/25/14 0651  Gross per 24 hour  Intake   1050 ml  Output    150 ml  Net    900 ml   Exam: Pt seen for f/u visit   Data Reviewed: Basic Metabolic Panel:  Recent Labs Lab 11/24/14 2340 11/25/14 0412  NA 140 139  K 4.1 4.5  CL 101 101  CO2 32 34*    GLUCOSE 173* 149*  BUN 21 16  CREATININE 0.98 0.86  CALCIUM 9.2 8.8    Liver Function Tests:  Recent Labs Lab 11/24/14 2340  AST 23  ALT 15  ALKPHOS 96  BILITOT 0.6  PROT 7.1  ALBUMIN 3.5    Coags:  Recent Labs Lab 11/24/14 2350  INR 2.49*   CBC:  Recent Labs Lab 11/24/14 2340 11/25/14 0412  WBC 14.1* 19.1*  NEUTROABS 11.2*  --   HGB 13.8 12.4*  HCT 41.8 38.0*  MCV 97.2 96.2  PLT 324 272   BNP (last 3 results)  Recent Labs  11/24/14 2340  BNP 79.8    CBG: No results for input(s): GLUCAP in the last 168 hours.   Studies:  Recent x-ray studies have been reviewed in detail by the Attending Physician  Scheduled Meds:  Scheduled Meds: . ipratropium-albuterol  3 mL Nebulization Q4H  . pantoprazole (PROTONIX) IV  40 mg Intravenous Q24H  . sodium chloride  3 mL Intravenous Q12H    Time spent on care of this patient: no charge   Cherene Altes , MD   [facility] Office  [phone] Pager - Text Page per Shea Evans as per below:  On-Call/Text Page:      [email or web address]      password [email or web address]  If 7PM-7AM, please contact night-coverage [email or web address] Password [email or web address] 11/25/2014, 8:24 AM   LOS: 1 day

## 2014-11-25 NOTE — ED Notes (Signed)
Breakfast tray ordered 

## 2014-11-25 NOTE — Progress Notes (Signed)
Patient arrived from ED-VSS-family aware of new room. CMT and elink notified of arrival.   Will continue to monitor.

## 2014-11-26 LAB — COMPREHENSIVE METABOLIC PANEL
ALBUMIN: 2.6 g/dL — AB (ref 3.5–5.2)
ALT: 12 U/L (ref 0–53)
AST: 16 U/L (ref 0–37)
Alkaline Phosphatase: 70 U/L (ref 39–117)
Anion gap: 4 — ABNORMAL LOW (ref 5–15)
BUN: 13 mg/dL (ref 6–23)
CALCIUM: 8.3 mg/dL — AB (ref 8.4–10.5)
CO2: 33 mmol/L — AB (ref 19–32)
CREATININE: 0.82 mg/dL (ref 0.50–1.35)
Chloride: 102 mmol/L (ref 96–112)
GFR calc Af Amer: 89 mL/min — ABNORMAL LOW (ref >90)
GFR calc non Af Amer: 77 mL/min — ABNORMAL LOW (ref >90)
Glucose, Bld: 120 mg/dL — ABNORMAL HIGH (ref 70–99)
POTASSIUM: 4.3 mmol/L (ref 3.5–5.1)
Sodium: 139 mmol/L (ref 135–145)
Total Bilirubin: 0.9 mg/dL (ref 0.3–1.2)
Total Protein: 5.7 g/dL — ABNORMAL LOW (ref 6.0–8.3)

## 2014-11-26 LAB — PROTIME-INR
INR: 2.49 — ABNORMAL HIGH (ref 0.00–1.49)
Prothrombin Time: 27.1 seconds — ABNORMAL HIGH (ref 11.6–15.2)

## 2014-11-26 MED ORDER — ASPIRIN 81 MG PO CHEW
81.0000 mg | CHEWABLE_TABLET | Freq: Every day | ORAL | Status: DC
  Administered 2014-11-26 – 2014-12-01 (×6): 81 mg via ORAL
  Filled 2014-11-26 (×7): qty 1

## 2014-11-26 MED ORDER — METOPROLOL TARTRATE 25 MG PO TABS: 25.0000 mg | ORAL_TABLET | Freq: Two times a day (BID) | ORAL | Status: DC

## 2014-11-26 MED ORDER — METOPROLOL TARTRATE 12.5 MG HALF TABLET
12.5000 mg | ORAL_TABLET | Freq: Two times a day (BID) | ORAL | Status: DC
  Administered 2014-11-26 – 2014-11-28 (×5): 12.5 mg via ORAL
  Filled 2014-11-26 (×8): qty 1

## 2014-11-26 MED ORDER — WARFARIN - PHARMACIST DOSING INPATIENT
Freq: Every day | Status: DC
  Administered 2014-11-28 – 2014-11-30 (×2)

## 2014-11-26 MED ORDER — BUDESONIDE 0.25 MG/2ML IN SUSP
0.2500 mg | Freq: Two times a day (BID) | RESPIRATORY_TRACT | Status: DC
  Administered 2014-11-26 – 2014-12-01 (×10): 0.25 mg via RESPIRATORY_TRACT
  Filled 2014-11-26 (×13): qty 2

## 2014-11-26 MED ORDER — LEVOTHYROXINE SODIUM 75 MCG PO TABS
75.0000 ug | ORAL_TABLET | Freq: Every day | ORAL | Status: DC
  Administered 2014-11-27 – 2014-12-01 (×5): 75 ug via ORAL
  Filled 2014-11-26 (×6): qty 1

## 2014-11-26 MED ORDER — WARFARIN SODIUM 3 MG PO TABS
3.0000 mg | ORAL_TABLET | Freq: Once | ORAL | Status: AC
  Administered 2014-11-26: 3 mg via ORAL
  Filled 2014-11-26: qty 1

## 2014-11-26 MED ORDER — ALBUTEROL SULFATE (2.5 MG/3ML) 0.083% IN NEBU: 2.5000 mg | INHALATION_SOLUTION | RESPIRATORY_TRACT | Status: DC | PRN

## 2014-11-26 MED ORDER — WARFARIN VIDEO: Freq: Once | Status: DC

## 2014-11-26 MED ORDER — CETYLPYRIDINIUM CHLORIDE 0.05 % MT LIQD
7.0000 mL | Freq: Two times a day (BID) | OROMUCOSAL | Status: DC
  Administered 2014-11-27 – 2014-12-01 (×9): 7 mL via OROMUCOSAL

## 2014-11-26 MED ORDER — TAMSULOSIN HCL 0.4 MG PO CAPS
0.4000 mg | ORAL_CAPSULE | Freq: Every day | ORAL | Status: DC
  Administered 2014-11-26 – 2014-12-01 (×6): 0.4 mg via ORAL
  Filled 2014-11-26 (×8): qty 1

## 2014-11-26 MED ORDER — DIGOXIN 125 MCG PO TABS
0.1250 mg | ORAL_TABLET | Freq: Every day | ORAL | Status: DC
  Administered 2014-11-26 – 2014-12-01 (×6): 0.125 mg via ORAL
  Filled 2014-11-26 (×6): qty 1

## 2014-11-26 NOTE — Progress Notes (Signed)
[place] TEAM 1 - Stepdown/ICU TEAM Progress Note  FIORE DETJEN [id number]:[id number] DOB: [birth year] DOA: 11/24/2014 PCP: Leonard Downing, MD  Admit HPI / Brief Narrative: 81 y.o. male with a history of O2 Dependent COPD, Chronic Atrial Fibrillation, Systolic CHF, Dilated Cardiomyopathy, Hyperlipidemia, Hypothyroid, and previous Vocal Cord Cancer who presented to the ED with complaints of worsening SOB and cough X 1 day. He denied fever and chills, however he reported having weakness and DOE x 1 month. He was found to have hypoxemia in the ED, and required BIPAP.  A CXR revealed asymmetric pulmonary edema versus infiltrates. He had a leukocytosis of 14.1and he was placed on IV Rocephin and Azithromycin initially.   HPI/Subjective: Pt states he feels much better today, though not quite back to his baseline.  He denies cp, n/v, or abdom pain.    Assessment/Plan:  Sepsis due to CAP clinically improving/stabilizing, though WBC climbing - follow trend - f/u CXR in AM  Acute exacerbation of COPD w/ acute on chronic hypoxic and hypercarbic resp failure  Quit smoking in 1992 - wears 2L when exerting himself or QHS - cont neb txs and inhaled steroids - following w/o systemic steroids presently and pt appears to be improving   Chronic diastolic CHF - recovered dilated CM EF 50-55% via TTE Jan 2015 - careful volume resuscitation - baseline wgt appears to be ~60kg - wgt stable at 59.1kg currently - no evidence of acute exacerbation   Chronic atrial fibrillation on anticoagulation INR 2.49 - rate controlled   HLD Resume zocor as oral intake improves  Hypothyroid  Cont home dose of synthroid  Hx of laryngeal CA s/p XRT  Code Status: FULL Family Communication: no family present at time of exam Disposition Plan: SDU - begin PT/OT   Consultants: none  Procedures: none  Antibiotics: Vanc 2/5 Cefepime 2/5 Azithro 2/5 > Rocpehin 2/5 >  DVT prophylaxis: Warfarin    Objective: Blood pressure 99/44, pulse 92, temperature 98.2 F (36.8 C), temperature source Oral, resp. rate 31, height 5\' 8"  (1.727 m), weight 59.1 kg (130 lb 4.7 oz), SpO2 95 %.  Intake/Output Summary (Last 24 hours) at 11/26/14 0900 Last data filed at 11/26/14 0800  Gross per 24 hour  Intake 1338.75 ml  Output   1475 ml  Net -136.25 ml   Exam: General: No acute respiratory distress at rest in bed  Lungs: diminished breath sounds th/o - mild exp wheeze  Cardiovascular: Regular rate without murmur gallop or rub normal S1 and S2 Abdomen: Nontender, nondistended, soft, bowel sounds positive, no rebound, no ascites, no appreciable mass Extremities: No significant cyanosis, clubbing, or edema bilateral lower extremities   Data Reviewed: Basic Metabolic Panel:  Recent Labs Lab 11/24/14 2340 11/25/14 0412 11/26/14 0255  NA 140 139 139  K 4.1 4.5 4.3  CL 101 101 102  CO2 32 34* 33*  GLUCOSE 173* 149* 120*  BUN 21 16 13   CREATININE 0.98 0.86 0.82  CALCIUM 9.2 8.8 8.3*    Liver Function Tests:  Recent Labs Lab 11/24/14 2340 11/26/14 0255  AST 23 16  ALT 15 12  ALKPHOS 96 70  BILITOT 0.6 0.9  PROT 7.1 5.7*  ALBUMIN 3.5 2.6*    Coags:  Recent Labs Lab 11/24/14 2350 11/26/14 0255  INR 2.49* 2.49*   CBC:  Recent Labs Lab 11/24/14 2340 11/25/14 0412  WBC 14.1* 19.1*  NEUTROABS 11.2*  --   HGB 13.8 12.4*  HCT 41.8 38.0*  MCV 97.2 96.2  PLT 324 272   BNP (last 3 results)  Recent Labs  11/24/14 2340  BNP 79.8    CBG: No results for input(s): GLUCAP in the last 168 hours.   Studies:  Recent x-ray studies have been reviewed in detail by the Attending Physician  Scheduled Meds:  Scheduled Meds: . azithromycin  500 mg Intravenous Q24H  . cefTRIAXone (ROCEPHIN)  IV  1 g Intravenous Q24H  . ipratropium-albuterol  3 mL Nebulization QID    Time spent on care of this patient: 35 mins   Obert Espindola T , MD   [facility] Office   [phone] Pager - Text Page per Shea Evans as per below:  On-Call/Text Page:      [email or web address]      password [email or web address]  If 7PM-7AM, please contact night-coverage [email or web address] Password [email or web address] 11/26/2014, 9:00 AM   LOS: 2 days

## 2014-11-26 NOTE — Progress Notes (Signed)
ANTICOAGULATION CONSULT NOTE - Initial Consult  Pharmacy Consult for warfarin Indication: atrial fibrillation  No Known Allergies  Patient Measurements: Height: 5\' 8"  (172.7 cm) Weight: 130 lb 4.7 oz (59.1 kg) IBW/kg (Calculated) : 68.4  Vital Signs: Temp: 98.3 F (36.8 C) (02/07 1102) Temp Source: Oral (02/07 1102) BP: 142/54 mmHg (02/07 1102) Pulse Rate: 92 (02/07 0240)  Labs:  Recent Labs  11/24/14 2340 11/24/14 2350 11/25/14 0412 11/26/14 0255  HGB 13.8  --  12.4*  --   HCT 41.8  --  38.0*  --   PLT 324  --  272  --   LABPROT  --  27.1*  --  27.1*  INR  --  2.49*  --  2.49*  CREATININE 0.98  --  0.86 0.82    Estimated Creatinine Clearance: 52.1 mL/min (by C-G formula based on Cr of 0.82).   Medical History: Past Medical History  Diagnosis Date  . Chronic atrial fibrillation   . Dilated cardiomyopathy   . PVC's (premature ventricular contractions)   . Hyperlipidemia   . COPD (chronic obstructive pulmonary disease)   . Hypothyroidism   . Renal calculi   . Chronic anticoagulation   . CHF (congestive heart failure)     DUE TO SYSTOLIC DYSFUNCTION  . Shortness of breath     exertional  . Cancer     vocal cord - radiation     Medications:  Scheduled:  . aspirin  81 mg Oral Daily  . azithromycin  500 mg Intravenous Q24H  . budesonide (PULMICORT) nebulizer solution  0.25 mg Nebulization BID  . cefTRIAXone (ROCEPHIN)  IV  1 g Intravenous Q24H  . digoxin  0.125 mg Oral Daily  . ipratropium-albuterol  3 mL Nebulization QID  . [START ON 11/27/2014] levothyroxine  75 mcg Oral QAC breakfast  . metoprolol tartrate  12.5 mg Oral BID  . tamsulosin  0.4 mg Oral QPC breakfast    Assessment: 81 yo M who presented to the ED with SOB and cough. Patient with hx of atrial fibrillation, on chronic warfarin at home PTA. Dose =  4.5mg  daily, except 3mg  on Sunday and Wednesday. INR on admission therapeutic at 2.49. INR this morning exactly the same at 2.49. Would expect  this to be lower since patient did not get a dose on 2/6 due to being NPO. Hgb 12.4, plts 272. No bleeding noted. Of note, patient on azithromycin.  Goal of Therapy:  INR 2-3 Monitor platelets by anticoagulation protocol: Yes   Plan:  Warfarin 3mg  x 1 tonight Anticipate restart of home dose Daily INR/PT Monitor s/sx of bleeding  Thank you for allowing pharmacy to be part of this patient's care team  [place], Pharm.D Clinical Pharmacy Resident Pager: [phone] 11/26/2014 .1:16 PM

## 2014-11-27 ENCOUNTER — Inpatient Hospital Stay (HOSPITAL_COMMUNITY): Payer: Medicare Other

## 2014-11-27 LAB — COMPREHENSIVE METABOLIC PANEL
ALT: 13 U/L (ref 0–53)
AST: 18 U/L (ref 0–37)
Albumin: 2.5 g/dL — ABNORMAL LOW (ref 3.5–5.2)
Alkaline Phosphatase: 73 U/L (ref 39–117)
Anion gap: 3 — ABNORMAL LOW (ref 5–15)
BILIRUBIN TOTAL: 0.4 mg/dL (ref 0.3–1.2)
BUN: 12 mg/dL (ref 6–23)
CHLORIDE: 107 mmol/L (ref 96–112)
CO2: 30 mmol/L (ref 19–32)
Calcium: 8.4 mg/dL (ref 8.4–10.5)
Creatinine, Ser: 0.77 mg/dL (ref 0.50–1.35)
GFR calc Af Amer: >90 mL/min (ref >90)
GFR, EST NON AFRICAN AMERICAN: 79 mL/min — AB (ref >90)
Glucose, Bld: 114 mg/dL — ABNORMAL HIGH (ref 70–99)
Potassium: 4.2 mmol/L (ref 3.5–5.1)
Sodium: 140 mmol/L (ref 135–145)
Total Protein: 5.9 g/dL — ABNORMAL LOW (ref 6.0–8.3)

## 2014-11-27 LAB — CBC
HEMATOCRIT: 34.6 % — AB (ref 39.0–52.0)
Hemoglobin: 11.3 g/dL — ABNORMAL LOW (ref 13.0–17.0)
MCH: 31.4 pg (ref 26.0–34.0)
MCHC: 32.7 g/dL (ref 30.0–36.0)
MCV: 96.1 fL (ref 78.0–100.0)
PLATELETS: 267 10*3/uL (ref 150–400)
RBC: 3.6 MIL/uL — ABNORMAL LOW (ref 4.22–5.81)
RDW: 14.5 % (ref 11.5–15.5)
WBC: 11.7 10*3/uL — AB (ref 4.0–10.5)

## 2014-11-27 LAB — PROTIME-INR
INR: 1.94 — ABNORMAL HIGH (ref 0.00–1.49)
PROTHROMBIN TIME: 22.3 s — AB (ref 11.6–15.2)

## 2014-11-27 MED ORDER — FUROSEMIDE 10 MG/ML IJ SOLN
INTRAMUSCULAR | Status: AC
  Filled 2014-11-27: qty 4

## 2014-11-27 MED ORDER — FUROSEMIDE 10 MG/ML IJ SOLN
40.0000 mg | Freq: Once | INTRAMUSCULAR | Status: AC
  Administered 2014-11-27: 40 mg via INTRAVENOUS

## 2014-11-27 MED ORDER — WARFARIN SODIUM 5 MG PO TABS
5.0000 mg | ORAL_TABLET | Freq: Once | ORAL | Status: AC
  Administered 2014-11-27: 5 mg via ORAL
  Filled 2014-11-27: qty 1

## 2014-11-27 NOTE — Progress Notes (Signed)
Bilateral lung sounds are coarse with faint rhonchi. MD [facility] paged and notified. Awaiting approval to continue transfer.

## 2014-11-27 NOTE — Progress Notes (Signed)
ANTICOAGULATION CONSULT NOTE - Follow Up Consult  Pharmacy Consult for warfarin Indication: atrial fibrillation  No Known Allergies  Patient Measurements: Height: 5\' 8"  (172.7 cm) Weight: 130 lb 4.7 oz (59.1 kg) IBW/kg (Calculated) : 68.4  Vital Signs: Temp: 97.9 F (36.6 C) (02/08 1230) Temp Source: Oral (02/08 1230) BP: 119/63 mmHg (02/08 1113) Pulse Rate: 96 (02/08 1113)  Labs:  Recent Labs  11/24/14 2340 11/24/14 2350 11/25/14 0412 11/26/14 0255 11/27/14 0054  HGB 13.8  --  12.4*  --  11.3*  HCT 41.8  --  38.0*  --  34.6*  PLT 324  --  272  --  267  LABPROT  --  27.1*  --  27.1* 22.3*  INR  --  2.49*  --  2.49* 1.94*  CREATININE 0.98  --  0.86 0.82 0.77    Estimated Creatinine Clearance: 53.4 mL/min (by C-G formula based on Cr of 0.77).   Medical History: Past Medical History  Diagnosis Date  . Chronic atrial fibrillation   . Dilated cardiomyopathy   . PVC's (premature ventricular contractions)   . Hyperlipidemia   . COPD (chronic obstructive pulmonary disease)   . Hypothyroidism   . Renal calculi   . Chronic anticoagulation   . CHF (congestive heart failure)     DUE TO SYSTOLIC DYSFUNCTION  . Shortness of breath     exertional  . Cancer     vocal cord - radiation     Medications:  Scheduled:  . antiseptic oral rinse  7 mL Mouth Rinse BID  . aspirin  81 mg Oral Daily  . azithromycin  500 mg Intravenous Q24H  . budesonide (PULMICORT) nebulizer solution  0.25 mg Nebulization BID  . cefTRIAXone (ROCEPHIN)  IV  1 g Intravenous Q24H  . digoxin  0.125 mg Oral Daily  . furosemide  40 mg Intravenous Once  . ipratropium-albuterol  3 mL Nebulization QID  . levothyroxine  75 mcg Oral QAC breakfast  . metoprolol tartrate  12.5 mg Oral BID  . tamsulosin  0.4 mg Oral QPC breakfast  . Warfarin - Pharmacist Dosing Inpatient   Does not apply q1800    Assessment: 81 yo M who admitted 11/24/2014  Via the ED with SOB and cough. Patient with hx of atrial  fibrillation, on chronic warfarin at home PTA. Dose =  4.5mg  daily, except 3mg  on Sunday and Wednesday. INR on admission therapeutic at 2.49. INR this morning now sub-therapeutic, not unexpected as patient did not get a dose on 2/6 due to being NPO. CBC trend slightly down. No bleeding noted. Of note, patient on azithromycin/ceftriaxone  Goal of Therapy:  INR 2-3 Monitor platelets by anticoagulation protocol: Yes   Plan:  Warfarin 5mg  x 1 tonight Anticipate restart of home dose Daily INR/PT Monitor s/sx of bleeding  Thank you for allowing pharmacy to be a part of this patients care team.  Rowe Robert Pharm.D., BCPS, AQ-Cardiology Clinical Pharmacist 11/27/2014 2:03 PM Pager: [phone] Phone: [phone]

## 2014-11-27 NOTE — Progress Notes (Signed)
[place] TEAM 1 - Stepdown/ICU TEAM Progress Note  Logan [name] [id number] DOB: 21-Jan-1925 DOA: 11/24/2014 PCP: Leonard Downing, MD  Admit HPI / Brief Narrative: 81 y.o. male with a history of O2 Dependent COPD, Chronic Atrial Fibrillation, Systolic CHF, Dilated Cardiomyopathy, Hyperlipidemia, Hypothyroid, and previous Vocal Cord Cancer who presented to the ED with complaints of worsening SOB and cough X 1 day. He denied fever and chills, however he reported having weakness and DOE x 1 month.  He was found to have hypoxemia in the ED, and required BIPAP.  A CXR revealed asymmetric pulmonary edema versus infiltrates. He had a leukocytosis of 14.1and he was placed on IV Rocephin and Azithromycin initially.   HPI/Subjective: Pt has no new complaints.  His breathing is improving.  He denies cp, n/v, or abdom pain.    Assessment/Plan:  Sepsis due to CAP clinically improving/stabilizing - WBC beginning to improve - follow trend - f/u CXR stable   Acute exacerbation of COPD w/ acute on chronic hypoxic and hypercarbic resp failure  Quit smoking in 1992 - chronically wears 2L when exerting himself or QHS - cont neb txs and inhaled steroids - following w/o systemic steroids presently and pt appears to be improving - will need to determine if continuos O2 or higher dose O2 is now indicated - will attempt to wean O2 as able   Chronic diastolic CHF - recovered dilated CM EF 50-55% via TTE Jan 2015 - careful volume resuscitation completed- baseline wgt appears to be ~60kg - wgt stable at 59.1kg currently - no evidence of acute exacerbation   Chronic atrial fibrillation on anticoagulation rate controlled - continuing warfarin per pharmacy   HLD Resume zocor    Hypothyroid  Cont home dose of synthroid  Hx of laryngeal CA s/p XRT  Code Status: FULL Family Communication: no family present at time of exam Disposition Plan: stable for transfer to med bed - begin PT/OT - assess need  for change to 24hr oxygen, or higher dose - assess need for CIR or SNF rehab   Consultants: none  Procedures: none  Antibiotics: Vanc 2/5 Cefepime 2/5 Azithro 2/5 > Rocpehin 2/5 >  DVT prophylaxis: Warfarin   Objective: Blood pressure 129/75, pulse 108, temperature 98 F (36.7 C), temperature source Oral, resp. rate 29, height 5\' 8"  (1.727 m), weight 59.1 kg (130 lb 4.7 oz), SpO2 94 %.  Intake/Output Summary (Last 24 hours) at 11/27/14 1149 Last data filed at 11/27/14 0930  Gross per 24 hour  Intake   1610 ml  Output   1345 ml  Net    265 ml   Exam: General: No acute respiratory distress at rest in bed  Lungs: diminished breath sounds th/o - mild exp wheeze less pronounced   Cardiovascular: Regular rate without murmur gallop or rub  Abdomen: Nontender, nondistended, soft, bowel sounds positive, no rebound, no ascites, no appreciable mass Extremities: No significant cyanosis, clubbing, or edema bilateral lower extremities   Data Reviewed: Basic Metabolic Panel:  Recent Labs Lab 11/24/14 2340 11/25/14 0412 11/26/14 0255 11/27/14 0054  NA 140 139 139 140  K 4.1 4.5 4.3 4.2  CL 101 101 102 107  CO2 32 34* 33* 30  GLUCOSE 173* 149* 120* 114*  BUN 21 16 13 12   CREATININE 0.98 0.86 0.82 0.77  CALCIUM 9.2 8.8 8.3* 8.4    Liver Function Tests:  Recent Labs Lab 11/24/14 2340 11/26/14 0255 11/27/14 0054  AST 23 16 18   ALT 15 12 13  ALKPHOS 96 70 73  BILITOT 0.6 0.9 0.4  PROT 7.1 5.7* 5.9*  ALBUMIN 3.5 2.6* 2.5*    Coags:  Recent Labs Lab 11/24/14 2350 11/26/14 0255 11/27/14 0054  INR 2.49* 2.49* 1.94*   CBC:  Recent Labs Lab 11/24/14 2340 11/25/14 0412 11/27/14 0054  WBC 14.1* 19.1* 11.7*  NEUTROABS 11.2*  --   --   HGB 13.8 12.4* 11.3*  HCT 41.8 38.0* 34.6*  MCV 97.2 96.2 96.1  PLT 324 272 267   BNP (last 3 results)  Recent Labs  11/24/14 2340  BNP 79.8   CBG: No results for input(s): GLUCAP in the last 168  hours.  Studies:  Recent x-ray studies have been reviewed in detail by the Attending Physician  Scheduled Meds:  Scheduled Meds: . antiseptic oral rinse  7 mL Mouth Rinse BID  . aspirin  81 mg Oral Daily  . azithromycin  500 mg Intravenous Q24H  . budesonide (PULMICORT) nebulizer solution  0.25 mg Nebulization BID  . cefTRIAXone (ROCEPHIN)  IV  1 g Intravenous Q24H  . digoxin  0.125 mg Oral Daily  . ipratropium-albuterol  3 mL Nebulization QID  . levothyroxine  75 mcg Oral QAC breakfast  . metoprolol tartrate  12.5 mg Oral BID  . tamsulosin  0.4 mg Oral QPC breakfast  . warfarin   Does not apply Once  . Warfarin - Pharmacist Dosing Inpatient   Does not apply q1800    Time spent on care of this patient: 35 mins   [facility] T , MD   [facility] Office  [phone] Pager - Text Page per Amion as per below:  On-Call/Text Page:      [email or web address]      password [email or web address]  If 7PM-7AM, please contact night-coverage [email or web address] Password [email or web address] 11/27/2014, 11:49 AM   LOS: 3 days

## 2014-11-28 DIAGNOSIS — E038 Other specified hypothyroidism: Secondary | ICD-10-CM

## 2014-11-28 LAB — PROTIME-INR
INR: 1.8 — ABNORMAL HIGH (ref 0.00–1.49)
Prothrombin Time: 21.1 seconds — ABNORMAL HIGH (ref 11.6–15.2)

## 2014-11-28 LAB — EXPECTORATED SPUTUM ASSESSMENT W REFEX TO RESP CULTURE

## 2014-11-28 LAB — EXPECTORATED SPUTUM ASSESSMENT W GRAM STAIN, RFLX TO RESP C

## 2014-11-28 MED ORDER — WARFARIN SODIUM 7.5 MG PO TABS
7.5000 mg | ORAL_TABLET | Freq: Once | ORAL | Status: AC
  Administered 2014-11-28: 7.5 mg via ORAL
  Filled 2014-11-28 (×2): qty 1

## 2014-11-28 MED ORDER — ATORVASTATIN CALCIUM 40 MG PO TABS
40.0000 mg | ORAL_TABLET | Freq: Every day | ORAL | Status: DC
  Administered 2014-11-28: 40 mg via ORAL
  Filled 2014-11-28 (×2): qty 1

## 2014-11-28 MED ORDER — ACETYLCYSTEINE 20 % IN SOLN
3.0000 mL | RESPIRATORY_TRACT | Status: DC
  Administered 2014-11-29: 3 mL via RESPIRATORY_TRACT
  Filled 2014-11-28: qty 4

## 2014-11-28 MED ORDER — SIMVASTATIN 80 MG PO TABS: 80.0000 mg | ORAL_TABLET | Freq: Every day | ORAL | Status: DC

## 2014-11-28 MED ORDER — ALBUTEROL SULFATE (2.5 MG/3ML) 0.083% IN NEBU
2.5000 mg | INHALATION_SOLUTION | RESPIRATORY_TRACT | Status: DC | PRN
Start: 2014-11-28 — End: 2014-11-29

## 2014-11-28 MED ORDER — AZITHROMYCIN 500 MG PO TABS
500.0000 mg | ORAL_TABLET | ORAL | Status: DC
  Administered 2014-11-29 – 2014-12-01 (×3): 500 mg via ORAL
  Filled 2014-11-28 (×3): qty 1

## 2014-11-28 MED ORDER — DM-GUAIFENESIN ER 30-600 MG PO TB12
1.0000 | ORAL_TABLET | Freq: Two times a day (BID) | ORAL | Status: DC
  Administered 2014-11-28 (×2): 1 via ORAL
  Filled 2014-11-28 (×4): qty 1

## 2014-11-28 MED ORDER — DILTIAZEM HCL 100 MG IV SOLR
5.0000 mg/h | INTRAVENOUS | Status: AC
  Administered 2014-11-28: 5 mg/h via INTRAVENOUS
  Administered 2014-11-28 (×2): 7.5 mg/h via INTRAVENOUS

## 2014-11-28 MED ORDER — IPRATROPIUM-ALBUTEROL 0.5-2.5 (3) MG/3ML IN SOLN
3.0000 mL | Freq: Three times a day (TID) | RESPIRATORY_TRACT | Status: DC
  Administered 2014-11-28 – 2014-11-29 (×2): 3 mL via RESPIRATORY_TRACT
  Filled 2014-11-28 (×2): qty 3

## 2014-11-28 NOTE — Progress Notes (Signed)
Pt HR sustained afib at 140-160's. MD paged and notified. Order for cardizem gtt. Will continue to monitor.

## 2014-11-28 NOTE — Progress Notes (Signed)
ANTICOAGULATION CONSULT NOTE - Follow Up Consult  Pharmacy Consult for Coumadin Indication: atrial fibrillation  No Known Allergies  Patient Measurements: Height: 5\' 8"  (172.7 cm) Weight: 126 lb 12.2 oz (57.5 kg) IBW/kg (Calculated) : 68.4 Heparin Dosing Weight:    Vital Signs: Temp: 99 F (37.2 C) (02/09 1201) Temp Source: Oral (02/09 1201) BP: 106/59 mmHg (02/09 1300) Pulse Rate: 108 (02/09 1300)  Labs:  Recent Labs  11/26/14 0255 11/27/14 0054 11/28/14 0348  HGB  --  11.3*  --   HCT  --  34.6*  --   PLT  --  267  --   LABPROT 27.1* 22.3* 21.1*  INR 2.49* 1.94* 1.80*  CREATININE 0.82 0.77  --     Estimated Creatinine Clearance: 51.9 mL/min (by C-G formula based on Cr of 0.77).   Assessment: 81 yo M who admitted 11/24/2014 Via the ED with SOB and cough.   Anticoagulation: Afib. Admit INR 2.49. Home dose 4.5mg  daily, except 3mg  on Sunday and Wednesday. (no dose 2/6 due to being NPO). INR down to 1.8.  Infectious Disease: COPD exac/PNA. WBC down to 11.7. Tmax 99. Azithro/Rocephin.  Cardiovascular: Afib (CHADSVASC 2), CHF, dilated CM, HLD, EF 50-55% (1/15), BP 106/59, HR 108 on ASA81, digoxin, metoprolol  Endocrinology: hypothyroid on Sythroid  Nephrology: Scr 0.77 on Flomax  Pulmonary: COPD on home O2 on Pulmicort, duonebs  Hematology / Oncology: h/o vocal cord cancer. Last Hgb down to 11.3.  PTA Medication Issues  Goal of Therapy:  INR 2-3 Monitor platelets by anticoagulation protocol: Yes   Plan:  Coumadin 7.5mg  po x 1 tonight.   Ronnisha Felber S. Alford Highland, PharmD, BCPS Clinical Staff Pharmacist Pager [phone]  Eilene Ghazi Stillinger 11/28/2014,2:12 PM

## 2014-11-28 NOTE — Progress Notes (Signed)
[facility] TEAM 1 - Stepdown/ICU TEAM Progress Note  MARTEL GALVAN BSJ:[id number] DOB: Nov 28, 1924 DOA: 11/24/2014 PCP: Leonard Downing, MD  Admit HPI / Brief Narrative: 81 y.o.WM PMHx O2 Dependent COPD 2L when exerting himself , Chronic Atrial Fibrillation, Systolic CHF, Dilated Cardiomyopathy, pulmonary hypertension, Hyperlipidemia, Hypothyroid, and previous Vocal Cord Cancer who presented to the ED with complaints of worsening SOB and cough X 1 day. He denied fever and chills, however he reported having weakness and DOE x 1 month. He was found to have hypoxemia in the ED, and required BIPAP. A CXR revealed asymmetric pulmonary edema versus infiltrates. He had a leukocytosis of 14.1and he was placed on IV Rocephin and Azithromycin initially.   HPI/Subjective: 2/9 A/Ox 4 , patient states only uses O2 at home when he feels the need. States has pulse oximeter at home which he checks his SPO2 with.   Assessment/Plan: Sepsis due to CAP -Continue antibiotic course for full 10 days secondary to patient's poor underlying plain function  -Clinically improving/stabilizing  - WBC beginning to improve - follow trend  - f/u CXR stable   Acute exacerbation of COPD w/ acute on chronic hypoxic and hypercarbic resp failure  -Quit smoking in 1992  - chronically wears 2L when exerting himself or QHS - Pulmicort nebs  BID -DuoNeb QID -Mucomyst 2 with DuoNeb -Flutter valve -Mucinex DM BID - following w/o systemic steroids presently and pt appears to be improving - will need to determine if continuos O2 or higher dose O2 is now indicated - will attempt to wean O2 as able   Chronic diastolic CHF - recovered dilated CM -EF 50-55% via TTE Jan 2015 - careful volume resuscitation completed - baseline wgt appears to be ~60kg - wgt stable at 59.1kg currently -Daily a.m. Weight -Strict in and out  Pulmonary hypertension -See chronic atrial fibrillation  Chronic atrial fibrillation on  anticoagulation/A. fib RVR -Currently poorly rate controlled  -Start Cardizem drip  -Continue digoxin 0.125 mg daily  -Obtain digoxin level -Continue metoprolol 12.5 mg  BID - continuing warfarin per pharmacy   HLD -Resume zocor 80 mg daily -Lipid panel  Hypothyroid  -Cont home dose of synthroid 75 g daily -TSH level  Hx of laryngeal CA s/p XRT    Code Status: FULL Family Communication: no family present at time of exam Disposition Plan: stable for transfer to med bed - begin PT/OT - assess need for change to 24hr oxygen, or higher dose - assess need for CIR or SNF rehab     Consultants:   Procedure/Significant Events: 11/02/13 echocardiogram;LVEF= 50% to 55%.  - Right ventricle:  mildly dilated.  - Right atrium: moderately dilated. - Pulmonary arteries: PA peak pressure: 91mm Hg (S).   Culture 2/6 blood right arm/hand NGTD 2/6 MRSA by PCR negative 2/9 sputum pending   Antibiotics: Vanc 2/5>> stopped 2/6 Cefepime 2/5>> stopped 2/6 Azithro 2/5 > Rocpehin 2/5 >   DVT prophylaxis: Coumadin   Devices    LINES / TUBES:     Continuous Infusions: . sodium chloride 10 mL/hr (11/27/14 1202)  . diltiazem (CARDIZEM) infusion 7.5 mg/hr (11/28/14 1243)    Objective: VITAL SIGNS: Temp: 99 F (37.2 C) (02/09 1201) Temp Source: Oral (02/09 1201) BP: 106/59 mmHg (02/09 1300) Pulse Rate: 108 (02/09 1300) SPO2; FIO2:   Intake/Output Summary (Last 24 hours) at 11/28/14 1408 Last data filed at 11/28/14 1200  Gross per 24 hour  Intake 1029.67 ml  Output   2800 ml  Net -1770.33 ml  Exam: General: A/O 4, NAD, acute on chronic respiratory distress Lungs: left lingula, left upper lobe rhonchi with extra toward wheezing, right upper lobe clear to auscultation, right middle lobe/right lower lobe extra toward wheezing  Cardiovascular: Irregular irregular rhythm and rate without murmur gallop or rub normal S1 and S2 Abdomen: Nontender, nondistended,  soft, bowel sounds positive, no rebound, no ascites, no appreciable mass Extremities: No significant cyanosis, clubbing, or edema bilateral lower extremities  Data Reviewed: Basic Metabolic Panel:  Recent Labs Lab 11/24/14 2340 11/25/14 0412 11/26/14 0255 11/27/14 0054  NA 140 139 139 140  K 4.1 4.5 4.3 4.2  CL 101 101 102 107  CO2 32 34* 33* 30  GLUCOSE 173* 149* 120* 114*  BUN 21 16 13 12   CREATININE 0.98 0.86 0.82 0.77  CALCIUM 9.2 8.8 8.3* 8.4   Liver Function Tests:  Recent Labs Lab 11/24/14 2340 11/26/14 0255 11/27/14 0054  AST 23 16 18   ALT 15 12 13   ALKPHOS 96 70 73  BILITOT 0.6 0.9 0.4  PROT 7.1 5.7* 5.9*  ALBUMIN 3.5 2.6* 2.5*   No results for input(s): LIPASE, AMYLASE in the last 168 hours. No results for input(s): AMMONIA in the last 168 hours. CBC:  Recent Labs Lab 11/24/14 2340 11/25/14 0412 11/27/14 0054  WBC 14.1* 19.1* 11.7*  NEUTROABS 11.2*  --   --   HGB 13.8 12.4* 11.3*  HCT 41.8 38.0* 34.6*  MCV 97.2 96.2 96.1  PLT 324 272 267   Cardiac Enzymes: No results for input(s): CKTOTAL, CKMB, CKMBINDEX, TROPONINI in the last 168 hours. BNP (last 3 results)  Recent Labs  11/24/14 2340  BNP 79.8    ProBNP (last 3 results)  Recent Labs  12/21/13 1049 03/06/14 1226  PROBNP 206.0* 164.0*    CBG: No results for input(s): GLUCAP in the last 168 hours.  Recent Results (from the past 240 hour(s))  Culture, blood (routine x 2)     Status: None (Preliminary result)   Collection Time: 11/25/14 12:20 AM  Result Value Ref Range Status   Specimen Description BLOOD RIGHT ARM  Final   Special Requests BOTTLES DRAWN AEROBIC AND ANAEROBIC 10CC EACH  Final   Culture   Final           BLOOD CULTURE RECEIVED NO GROWTH TO DATE CULTURE WILL BE HELD FOR 5 DAYS BEFORE ISSUING A FINAL NEGATIVE REPORT Note: Culture results may be compromised due to an excessive volume of blood received in culture bottles. Performed at [facility]    Report  Status PENDING  Incomplete  Culture, blood (routine x 2)     Status: None (Preliminary result)   Collection Time: 11/25/14 12:28 AM  Result Value Ref Range Status   Specimen Description BLOOD RIGHT HAND  Final   Special Requests BOTTLES DRAWN AEROBIC ONLY 5CC  Final   Culture   Final           BLOOD CULTURE RECEIVED NO GROWTH TO DATE CULTURE WILL BE HELD FOR 5 DAYS BEFORE ISSUING A FINAL NEGATIVE REPORT Performed at [facility]    Report Status PENDING  Incomplete  MRSA PCR Screening     Status: None   Collection Time: 11/25/14  3:38 PM  Result Value Ref Range Status   MRSA by PCR NEGATIVE NEGATIVE Final    Comment:        The GeneXpert MRSA Assay (FDA approved for NASAL specimens only), is one component of a comprehensive MRSA colonization surveillance  program. It is not intended to diagnose MRSA infection nor to guide or monitor treatment for MRSA infections.      Studies:  Recent x-ray studies have been reviewed in detail by the Attending Physician  Scheduled Meds:  Scheduled Meds: . antiseptic oral rinse  7 mL Mouth Rinse BID  . aspirin  81 mg Oral Daily  . azithromycin  500 mg Intravenous Q24H  . budesonide (PULMICORT) nebulizer solution  0.25 mg Nebulization BID  . cefTRIAXone (ROCEPHIN)  IV  1 g Intravenous Q24H  . digoxin  0.125 mg Oral Daily  . ipratropium-albuterol  3 mL Nebulization QID  . levothyroxine  75 mcg Oral QAC breakfast  . metoprolol tartrate  12.5 mg Oral BID  . tamsulosin  0.4 mg Oral QPC breakfast  . Warfarin - Pharmacist Dosing Inpatient   Does not apply q1800    Time spent on care of this patient: 40 mins   Allie Bossier [facility]  [facility] Office  [phone] Pager - [phone]  On-Call/Text Page:      [email or web address]      password [email or web address]  If 7PM-7AM, please contact night-coverage [email or web address] Password [email or web address] 11/28/2014, 2:08 PM   LOS: 4 days   Care during the described time interval was provided by me .  I have  reviewed this patient's available data, including medical history, events of note, physical examination, radiology studies and test results as part of my evaluation  Dia Crawford, MD [phone] Pager

## 2014-11-28 NOTE — Evaluation (Addendum)
Occupational Therapy Evaluation Patient Details Name: Logan [name] MRN: [id number] DOB: 06/22/1925 Today's Date: 11/28/2014    History of Present Illness pt presents with Sepsis, COPD Exacerbation, and CAP with hx of CHF.     Clinical Impression    Pt admitted with above. Pt independent with ADLs, PTA. Feel pt will benefit from acute OT to increase activity tolerance prior to d/c.   Follow Up Recommendations  No OT follow up;Supervision/Assistance - 24 hour    Equipment Recommendations  None recommended by OT    Recommendations for Other Services       Precautions / Restrictions Precautions Precautions: Fall Precaution Comments: Watch HR and O2 sats.   Restrictions Weight Bearing Restrictions: No      Mobility Bed Mobility          General bed mobility comments: not assessed  Transfers Overall transfer level: Needs assistance Equipment used: None Transfers: Sit to/from Stand Sit to Stand: Min guard            Balance no LOB in session, but not formally assessed                           ADL Overall ADL's : Needs assistance/impaired                 Upper Body Dressing : Set up;Sitting;Supervision/safety   Lower Body Dressing: Min guard;Sit to/from stand   Toilet Transfer: Min guard;Ambulation (chair)           Functional mobility during ADLs: Min guard General ADL Comments: Educated on energy conservation techniques and deep breathing technique.  Explained benefit of therapy.     Vision  Pt wears glasses.                   Perception     Praxis      Pertinent Vitals/Pain Pain Assessment: Faces Faces Pain Scale: No hurt   HR elevated to 130-150's in session. O2 dropped to 80's in session on 4L of O2 but trended up. Educated on deep breathing technique. RR in 30's at times.     Hand Dominance     Extremity/Trunk Assessment Upper Extremity Assessment Upper Extremity Assessment: Overall WFL for tasks  assessed   Lower Extremity Assessment Lower Extremity Assessment: Defer to PT evaluation      Communication Communication Communication: No difficulties   Cognition Arousal/Alertness: Awake/alert Behavior During Therapy: WFL for tasks assessed/performed Overall Cognitive Status: Within Functional Limits for tasks assessed                     General Comments       Exercises       Shoulder Instructions      Home Living Family/patient expects to be discharged to:: Private residence Living Arrangements: Spouse/significant other Available Help at Discharge: Family;Available 24 hours/day Type of Home: House Home Access: Stairs to enter [employer] of Steps: 5 Entrance Stairs-Rails: Right;Left;Can reach both (side door has one rail) Home Layout: One level     Bathroom Shower/Tub: Teacher, early years/pre: Standard (sink close)     Home Equipment: Environmental consultant - 2 wheels;Shower seat          Prior Functioning/Environment Level of Independence: Independent             OT Diagnosis: Other (comment) (cardiopulmonary status limiting activity )   OT Problem List: Decreased activity tolerance;Decreased knowledge of use of DME  or AE;Decreased knowledge of precautions;Cardiopulmonary status limiting activity   OT Treatment/Interventions: Self-care/ADL training;DME and/or AE instruction;Therapeutic activities;Patient/family education;Balance training;Energy conservation;Therapeutic exercise    OT Goals(Current goals can be found in the care plan section) Acute Rehab OT Goals Patient Stated Goal: not stated OT Goal Formulation: With patient Time For Goal Achievement: 12/05/14 Potential to Achieve Goals: Good ADL Goals Pt Will Perform Upper Body Bathing: with modified independence;sitting;standing Pt Will Perform Lower Body Bathing: with modified independence;sit to/from stand Pt Will Perform Lower Body Dressing: with modified independence;sit  to/from stand Pt Will Transfer to Toilet: with modified independence;ambulating Additional ADL Goal #1: Pt will independently verbalize and demonstrate 3/3 energy conservation techniques during functional activities.  OT Frequency: Min 2X/week   Barriers to D/C:            Co-evaluation              End of Session Equipment Utilized During Treatment: Gait belt;Oxygen Nurse Communication: Other (comment) (HR)  Activity Tolerance: Other (comment) (elevated HR) Patient left: in chair;with call bell/phone within reach;with nursing/sitter in room   Time: 1102-1117 OT Time Calculation (min): 15 min Charges:  OT General Charges $OT Visit: 1 Procedure OT Evaluation $Initial OT Evaluation Tier I: 1 Procedure G-CodesBenito Mccreedy OTR/L [phone] 11/28/2014, 11:50 AM

## 2014-11-28 NOTE — Progress Notes (Signed)
Medicare Important Message given? YES  (If response is "NO", the following Medicare IM given date fields will be blank)  Date Medicare IM given: 11/28/14 Medicare IM given by:  Avik Leoni  

## 2014-11-28 NOTE — Evaluation (Signed)
Physical Therapy Evaluation Patient Details Name: Logan [name] MRN: [id number] DOB: 1925/07/02 Today's Date: 11/28/2014   History of Present Illness  pt presents with Sepsis, COPD Exacerbation, and CAP with hx of CHF.    Clinical Impression  Pt seems to move fairly well, but is limited by increased HR to 164 nonsustained and O2 sats decrease to 86% on 4L O2 with just getting to chair.  Further mobility deferred at this time 2/2 HR and O2 sats, RN aware.  Will continue to follow.      Follow Up Recommendations Home health PT;Supervision/Assistance - 24 hour    Equipment Recommendations  None recommended by PT    Recommendations for Other Services       Precautions / Restrictions Precautions Precautions: Fall Precaution Comments: Watch HR and O2 sats.   Restrictions Weight Bearing Restrictions: No      Mobility  Bed Mobility Overal bed mobility: Modified Independent                Transfers Overall transfer level: Needs assistance Equipment used: None Transfers: Sit to/from [employer] Transfers Sit to Stand: Min guard Stand pivot transfers: Min assist       General transfer comment: pt reaches for armrests of chair during transfer.  pt's HR increased to 164 nonsustained during transfer and O2 sats did not read well during transfer, but once sitting sats were 86% on 4L O2.    Ambulation/Gait                Stairs            Wheelchair Mobility    Modified Rankin (Stroke Patients Only)       Balance Overall balance assessment: Needs assistance Sitting-balance support: No upper extremity supported;Feet supported Sitting balance-Leahy Scale: Good     Standing balance support: Single extremity supported;During functional activity Standing balance-Leahy Scale: Poor                               Pertinent Vitals/Pain Pain Assessment: No/denies pain    Home Living Family/patient expects to be discharged to:: Private  residence Living Arrangements: Spouse/significant other Available Help at Discharge: Family;Available 24 hours/day Type of Home: House Home Access: Stairs to enter Entrance Stairs-Rails: Right;Left;Can reach both (side door has one rail) Technical brewer of Steps: 5 Home Layout: One level Home Equipment: Environmental consultant - 2 wheels      Prior Function Level of Independence: Independent               Hand Dominance        Extremity/Trunk Assessment   Upper Extremity Assessment: Defer to OT evaluation           Lower Extremity Assessment: Generalized weakness      Cervical / Trunk Assessment: Kyphotic  Communication   Communication: No difficulties  Cognition Arousal/Alertness: Awake/alert Behavior During Therapy: WFL for tasks assessed/performed Overall Cognitive Status: Within Functional Limits for tasks assessed                      General Comments      Exercises        Assessment/Plan    PT Assessment Patient needs continued PT services  PT Diagnosis Difficulty walking   PT Problem List Decreased activity tolerance;Decreased balance;Decreased mobility;Decreased knowledge of use of DME;Cardiopulmonary status limiting activity  PT Treatment Interventions DME instruction;Gait training;Stair training;Functional mobility training;Therapeutic activities;Therapeutic exercise;Balance training;Patient/family education  PT Goals (Current goals can be found in the Care Plan section) Acute Rehab PT Goals Patient Stated Goal: Home with wife.   PT Goal Formulation: With patient Time For Goal Achievement: 12/12/14 Potential to Achieve Goals: Good    Frequency Min 3X/week   Barriers to discharge        Co-evaluation               End of Session Equipment Utilized During Treatment: Oxygen Activity Tolerance: Treatment limited secondary to agitation (HR and O2 sats.  ) Patient left: in chair;with call bell/phone within reach Nurse  Communication: Mobility status (HR and O2 sats)         Time: 0912-0927 PT Time Calculation (min) (ACUTE ONLY): 15 min   Charges:   PT Evaluation $Initial PT Evaluation Tier I: 1 Procedure     PT G CodesCatarina Hartshorn, [place] 11/28/2014, 10:28 AM

## 2014-11-29 ENCOUNTER — Inpatient Hospital Stay (HOSPITAL_COMMUNITY): Payer: Medicare Other

## 2014-11-29 DIAGNOSIS — E785 Hyperlipidemia, unspecified: Secondary | ICD-10-CM

## 2014-11-29 LAB — COMPREHENSIVE METABOLIC PANEL
ALBUMIN: 2.5 g/dL — AB (ref 3.5–5.2)
ALT: 16 U/L (ref 0–53)
AST: 21 U/L (ref 0–37)
Alkaline Phosphatase: 72 U/L (ref 39–117)
Anion gap: 5 (ref 5–15)
BILIRUBIN TOTAL: 0.4 mg/dL (ref 0.3–1.2)
BUN: 17 mg/dL (ref 6–23)
CO2: 32 mmol/L (ref 19–32)
Calcium: 8.6 mg/dL (ref 8.4–10.5)
Chloride: 103 mmol/L (ref 96–112)
Creatinine, Ser: 0.81 mg/dL (ref 0.50–1.35)
GFR calc Af Amer: 89 mL/min — ABNORMAL LOW (ref >90)
GFR calc non Af Amer: 77 mL/min — ABNORMAL LOW (ref >90)
Glucose, Bld: 125 mg/dL — ABNORMAL HIGH (ref 70–99)
POTASSIUM: 4.2 mmol/L (ref 3.5–5.1)
SODIUM: 140 mmol/L (ref 135–145)
TOTAL PROTEIN: 5.7 g/dL — AB (ref 6.0–8.3)

## 2014-11-29 LAB — CBC WITH DIFFERENTIAL/PLATELET
Basophils Absolute: 0 10*3/uL (ref 0.0–0.1)
Basophils Relative: 0 % (ref 0–1)
EOS ABS: 0.6 10*3/uL (ref 0.0–0.7)
Eosinophils Relative: 6 % — ABNORMAL HIGH (ref 0–5)
HCT: 34.6 % — ABNORMAL LOW (ref 39.0–52.0)
HEMOGLOBIN: 11.2 g/dL — AB (ref 13.0–17.0)
Lymphocytes Relative: 13 % (ref 12–46)
Lymphs Abs: 1.2 10*3/uL (ref 0.7–4.0)
MCH: 31 pg (ref 26.0–34.0)
MCHC: 32.4 g/dL (ref 30.0–36.0)
MCV: 95.8 fL (ref 78.0–100.0)
Monocytes Absolute: 0.9 10*3/uL (ref 0.1–1.0)
Monocytes Relative: 9 % (ref 3–12)
NEUTROS PCT: 72 % (ref 43–77)
Neutro Abs: 7.2 10*3/uL (ref 1.7–7.7)
PLATELETS: 307 10*3/uL (ref 150–400)
RBC: 3.61 MIL/uL — AB (ref 4.22–5.81)
RDW: 14.3 % (ref 11.5–15.5)
WBC: 9.9 10*3/uL (ref 4.0–10.5)

## 2014-11-29 LAB — LIPID PANEL
CHOL/HDL RATIO: 3.8 ratio
CHOLESTEROL: 138 mg/dL (ref 0–200)
HDL: 36 mg/dL — AB (ref >39)
LDL Cholesterol: 90 mg/dL (ref 0–99)
TRIGLYCERIDES: 58 mg/dL (ref <150)
VLDL: 12 mg/dL (ref 0–40)

## 2014-11-29 LAB — MAGNESIUM: Magnesium: 2.1 mg/dL (ref 1.5–2.5)

## 2014-11-29 LAB — PROTIME-INR
INR: 2.21 — AB (ref 0.00–1.49)
PROTHROMBIN TIME: 24.7 s — AB (ref 11.6–15.2)

## 2014-11-29 LAB — DIGOXIN LEVEL: Digoxin Level: 0.4 ng/mL — ABNORMAL LOW (ref 0.8–2.0)

## 2014-11-29 LAB — TSH: TSH: 2.176 u[IU]/mL (ref 0.350–4.500)

## 2014-11-29 MED ORDER — UMECLIDINIUM-VILANTEROL 62.5-25 MCG/INH IN AEPB: 1.0000 | INHALATION_SPRAY | Freq: Every day | RESPIRATORY_TRACT | Status: DC

## 2014-11-29 MED ORDER — ATORVASTATIN CALCIUM 40 MG PO TABS
40.0000 mg | ORAL_TABLET | Freq: Every day | ORAL | Status: DC
  Administered 2014-11-29 – 2014-11-30 (×2): 40 mg via ORAL
  Filled 2014-11-29 (×3): qty 1

## 2014-11-29 MED ORDER — LEVALBUTEROL HCL 0.63 MG/3ML IN NEBU
0.6300 mg | INHALATION_SOLUTION | Freq: Four times a day (QID) | RESPIRATORY_TRACT | Status: DC
  Administered 2014-11-29 – 2014-12-01 (×8): 0.63 mg via RESPIRATORY_TRACT
  Filled 2014-11-29 (×16): qty 3

## 2014-11-29 MED ORDER — LEVALBUTEROL HCL 0.63 MG/3ML IN NEBU: 0.6300 mg | INHALATION_SOLUTION | RESPIRATORY_TRACT | Status: DC | PRN

## 2014-11-29 MED ORDER — METOPROLOL TARTRATE 25 MG PO TABS
25.0000 mg | ORAL_TABLET | Freq: Two times a day (BID) | ORAL | Status: DC
  Administered 2014-11-29 – 2014-11-30 (×4): 25 mg via ORAL
  Filled 2014-11-29 (×7): qty 1

## 2014-11-29 MED ORDER — IPRATROPIUM BROMIDE 0.02 % IN SOLN
0.5000 mg | Freq: Four times a day (QID) | RESPIRATORY_TRACT | Status: DC
  Administered 2014-11-29 – 2014-12-01 (×8): 0.5 mg via RESPIRATORY_TRACT
  Filled 2014-11-29 (×8): qty 2.5

## 2014-11-29 MED ORDER — DM-GUAIFENESIN ER 30-600 MG PO TB12
1.0000 | ORAL_TABLET | Freq: Two times a day (BID) | ORAL | Status: DC | PRN
  Administered 2014-11-29: 1 via ORAL
  Filled 2014-11-29 (×2): qty 2

## 2014-11-29 MED ORDER — FUROSEMIDE 40 MG PO TABS
40.0000 mg | ORAL_TABLET | Freq: Every day | ORAL | Status: DC
  Administered 2014-11-29 – 2014-12-01 (×3): 40 mg via ORAL
  Filled 2014-11-29 (×3): qty 1

## 2014-11-29 MED ORDER — WARFARIN SODIUM 3 MG PO TABS
3.0000 mg | ORAL_TABLET | Freq: Once | ORAL | Status: AC
  Administered 2014-11-29: 3 mg via ORAL
  Filled 2014-11-29: qty 1

## 2014-11-29 MED ORDER — ADULT MULTIVITAMIN W/MINERALS CH
1.0000 | ORAL_TABLET | Freq: Every day | ORAL | Status: DC
  Administered 2014-11-29 – 2014-12-01 (×3): 1 via ORAL
  Filled 2014-11-29 (×3): qty 1

## 2014-11-29 MED ORDER — CEFUROXIME AXETIL 500 MG PO TABS
500.0000 mg | ORAL_TABLET | Freq: Two times a day (BID) | ORAL | Status: DC
  Administered 2014-11-29 – 2014-12-01 (×4): 500 mg via ORAL
  Filled 2014-11-29 (×6): qty 1

## 2014-11-29 NOTE — Progress Notes (Signed)
ANTICOAGULATION CONSULT NOTE - Follow Up Consult  Pharmacy Consult for Coumadin Indication: atrial fibrillation  No Known Allergies  Patient Measurements: Height: 5\' 8"  (172.7 cm) Weight: 128 lb 12 oz (58.4 kg) IBW/kg (Calculated) : 68.4   Vital Signs: Temp: 97.6 F (36.4 C) (02/10 1100) Temp Source: Oral (02/10 1100) BP: 126/72 mmHg (02/10 1100) Pulse Rate: 103 (02/10 1108)  Labs:  Recent Labs  11/27/14 0054 11/28/14 0348 11/29/14 0244  HGB 11.3*  --  11.2*  HCT 34.6*  --  34.6*  PLT 267  --  307  LABPROT 22.3* 21.1* 24.7*  INR 1.94* 1.80* 2.21*  CREATININE 0.77  --  0.81    Estimated Creatinine Clearance: 52.1 mL/min (by C-G formula based on Cr of 0.81).   Assessment: 81 yo M who admitted 11/24/2014 Via the ED with SOB and cough.   Anticoagulation: Afib. Admit INR 2.49. Home dose 4.5mg  daily, except 3mg  on Sunday and Wednesday. (no dose 2/6 due to being NPO). INR now therapeutic at 2.21.  CBC stable. No bleeding reported.    Goal of Therapy:  INR 2-3   Plan:  Coumadin 3 mg po x 1 tonight. Daily INR  Eudelia Bunch, Pharm.D. [phone] 11/29/2014 2:25 PM

## 2014-11-29 NOTE — Progress Notes (Signed)
Report called pt transferring to 2W18 via bed with belongings. Wife aware of new room number.

## 2014-11-29 NOTE — Progress Notes (Signed)
[place] TEAM 1 - Stepdown/ICU TEAM Progress Note  Logan [name] HEN:[id number] DOB: 24-Jun-1925 DOA: 11/24/2014 PCP: Leonard Downing, MD  Admit HPI / Brief Narrative: 81 y.o. male with a history of O2 Dependent COPD, Chronic Atrial Fibrillation, Systolic CHF, Dilated Cardiomyopathy, Hyperlipidemia, Hypothyroid, and previous Vocal Cord Cancer who presented to the ED with complaints of worsening SOB and cough X 1 day. He denied fever and chills, however he reported having weakness and DOE x 1 month.  He was found to have hypoxemia in the ED, and required BIPAP.  A CXR revealed asymmetric pulmonary edema versus infiltrates. He had a leukocytosis of 14.1and he was placed on IV Rocephin and Azithromycin initially.   HPI/Subjective: Pt has no new complaints today.  He denies cp, n/v, or abdom pain.  His sob is close to his baseline, though he is currently requiring 4LPM when at rest.  I turned his O2 down to 3L while talking to him, and he quickly dropped his sats into the mid 80s.    Assessment/Plan:  Sepsis due to CAP clinically improving/stabilizing - WBC have now normalized - f/u CXR stable   Acute exacerbation of severe COPD w/ acute on chronic hypoxic and hypercarbic resp failure  Quit smoking in 1992 - chronically wears 2L when exerting himself or QHS, though I suspect he is not maximally compliant w/ his O2 tx - cont neb txs and inhaled steroids - pt appears to be improving slowly w/o the use of systemic steroids - continuos O2 will definitely be required at d/c, but we will attempt to lower the flow as much as possible, w/ the goal to keep sats 88% or > - counseled pt on need to wear O2 at all times for now at least - he seems dubious, but will cont to encourage   Chronic diastolic CHF - recovered dilated CM EF 50-55% via TTE Jan 2015 - careful volume resuscitation completed - baseline wgt appears to be ~60kg - wgt 58.4kg currently - no evidence of acute exacerbation - will  resume home lasix dose to assure remains at goal   Chronic atrial fibrillation on anticoagulation RVR was encountered yesterday, necessitating intitiation of cardizem gtt - HR now very well controlled - increase BB back to usual home dose, and stop cardizem today - transfer to tele and watch rate over night - continuing warfarin per pharmacy   HLD Resumed zocor (lipitor substituted by pharm)   Hypothyroid  Cont home dose of synthroid  Hx of laryngeal CA s/p XRT  Code Status: FULL Family Communication: no family present at time of exam Disposition Plan: stable for transfer to tele bed - determine home does of continuous O2 required - probable d/c home 2/11  Consultants: none  Procedures: none  Antibiotics: Vanc 2/5 Cefepime 2/5 Azithro 2/5 > Rocpehin 2/5 > 2/9 Ceftin 2/10 >  DVT prophylaxis: Warfarin   Objective: Blood pressure 111/70, pulse 85, temperature 97.7 F (36.5 C), temperature source Oral, resp. rate 27, height 5\' 8"  (1.727 m), weight 58.4 kg (128 lb 12 oz), SpO2 96 %.  Intake/Output Summary (Last 24 hours) at 11/29/14 0835 Last data filed at 11/29/14 0746  Gross per 24 hour  Intake 1021.71 ml  Output   1050 ml  Net -28.29 ml   Exam: General: No acute respiratory distress at rest in bed  Lungs: diminished breath sounds th/o - no appreciable wheeze today  Cardiovascular: Regular rate without murmur gallop or rub  Abdomen: Nontender, nondistended, soft, bowel sounds  positive, no rebound, no ascites, no appreciable mass Extremities: No significant cyanosis, clubbing, or edema bilateral lower extremities   Data Reviewed: Basic Metabolic Panel:  Recent Labs Lab 11/24/14 2340 11/25/14 0412 11/26/14 0255 11/27/14 0054 11/29/14 0244  NA 140 139 139 140 140  K 4.1 4.5 4.3 4.2 4.2  CL 101 101 102 107 103  CO2 32 34* 33* 30 32  GLUCOSE 173* 149* 120* 114* 125*  BUN 21 16 13 12 17   CREATININE 0.98 0.86 0.82 0.77 0.81  CALCIUM 9.2 8.8 8.3* 8.4 8.6  MG   --   --   --   --  2.1    Liver Function Tests:  Recent Labs Lab 11/24/14 2340 11/26/14 0255 11/27/14 0054 11/29/14 0244  AST 23 16 18 21   ALT 15 12 13 16   ALKPHOS 96 70 73 72  BILITOT 0.6 0.9 0.4 0.4  PROT 7.1 5.7* 5.9* 5.7*  ALBUMIN 3.5 2.6* 2.5* 2.5*    Coags:  Recent Labs Lab 11/24/14 2350 11/26/14 0255 11/27/14 0054 11/28/14 0348 11/29/14 0244  INR 2.49* 2.49* 1.94* 1.80* 2.21*   CBC:  Recent Labs Lab 11/24/14 2340 11/25/14 0412 11/27/14 0054 11/29/14 0244  WBC 14.1* 19.1* 11.7* 9.9  NEUTROABS 11.2*  --   --  7.2  HGB 13.8 12.4* 11.3* 11.2*  HCT 41.8 38.0* 34.6* 34.6*  MCV 97.2 96.2 96.1 95.8  PLT 324 272 267 307   BNP (last 3 results)  Recent Labs  11/24/14 2340  BNP 79.8   Studies:  Recent x-ray studies have been reviewed in detail by the Attending Physician  Scheduled Meds:  Scheduled Meds: . acetylcysteine  3 mL Nebulization UD  . antiseptic oral rinse  7 mL Mouth Rinse BID  . aspirin  81 mg Oral Daily  . atorvastatin  40 mg Oral q1800  . azithromycin  500 mg Oral Q24H  . budesonide (PULMICORT) nebulizer solution  0.25 mg Nebulization BID  . cefTRIAXone (ROCEPHIN)  IV  1 g Intravenous Q24H  . dextromethorphan-guaiFENesin  1 tablet Oral BID  . digoxin  0.125 mg Oral Daily  . ipratropium-albuterol  3 mL Nebulization TID  . levothyroxine  75 mcg Oral QAC breakfast  . metoprolol tartrate  12.5 mg Oral BID  . tamsulosin  0.4 mg Oral QPC breakfast  . Warfarin - Pharmacist Dosing Inpatient   Does not apply q1800    Time spent on care of this patient: 60 mins   Breane Grunwald T , MD   [facility] Office  [phone] Pager - Text Page per Shea Evans as per below:  On-Call/Text Page:      [email or web address]      password [email or web address]  If 7PM-7AM, please contact night-coverage [email or web address] Password [email or web address] 11/29/2014, 8:35 AM   LOS: 4 days

## 2014-11-29 NOTE — Progress Notes (Signed)
SATURATION QUALIFICATIONS: (This note is used to comply with regulatory documentation for home oxygen)  Patient Saturations on 3L/min at Rest = 92% ;  while ambulating needed to increase oxygen to 6l/min as pt sats dropped to 79% on 3l/min When pt returned to room and pt returned to bed and rested sats increased to 95% and was able to drop oxygen back to 3l/min for sats of 93%.

## 2014-11-30 DIAGNOSIS — E038 Other specified hypothyroidism: Secondary | ICD-10-CM | POA: Diagnosis present

## 2014-11-30 DIAGNOSIS — I4891 Unspecified atrial fibrillation: Secondary | ICD-10-CM | POA: Diagnosis present

## 2014-11-30 DIAGNOSIS — E785 Hyperlipidemia, unspecified: Secondary | ICD-10-CM | POA: Diagnosis present

## 2014-11-30 DIAGNOSIS — I272 Pulmonary hypertension, unspecified: Secondary | ICD-10-CM | POA: Diagnosis present

## 2014-11-30 DIAGNOSIS — I420 Dilated cardiomyopathy: Secondary | ICD-10-CM | POA: Diagnosis present

## 2014-11-30 DIAGNOSIS — I270 Primary pulmonary hypertension: Secondary | ICD-10-CM

## 2014-11-30 LAB — PROTIME-INR
INR: 2.56 — AB (ref 0.00–1.49)
Prothrombin Time: 27.7 seconds — ABNORMAL HIGH (ref 11.6–15.2)

## 2014-11-30 MED ORDER — WARFARIN SODIUM 3 MG PO TABS
3.0000 mg | ORAL_TABLET | Freq: Once | ORAL | Status: AC
  Administered 2014-11-30: 3 mg via ORAL
  Filled 2014-11-30: qty 1

## 2014-11-30 MED ORDER — DIGOXIN 125 MCG PO TABS
0.1250 mg | ORAL_TABLET | Freq: Once | ORAL | Status: AC
  Administered 2014-11-30: 0.125 mg via ORAL
  Filled 2014-11-30: qty 1

## 2014-11-30 NOTE — Progress Notes (Signed)
SATURATION QUALIFICATIONS: (This note is used to comply with regulatory documentation for home oxygen)  Patient Saturations on 3L at Rest = 90%  Patient Saturations on 6L while Ambulating = 85-90% with cues for breathing  Please briefly explain why patient needs home oxygen:pt with desaturation with all mobility even on [address] [place]

## 2014-11-30 NOTE — Progress Notes (Signed)
Physical Therapy Treatment Patient Details Name: TREMANE SPURGEON MRN: [id number] DOB: 1925/05/05 Today's Date: 11/30/2014    History of Present Illness pt presents with Sepsis, COPD Exacerbation, and CAP with hx of CHF.      PT Comments    Pt very pleasant and moving well with limitations only related to oxygen saturation and fatigue. Pt very motivated to return home and be independent. Pt educated for IS use as only achieving 576ml as well as HEp and encouraged to increase activity in small bouts. Will continue to follow.   Follow Up Recommendations  Home health PT;Supervision/Assistance - 24 hour     Equipment Recommendations  None recommended by PT    Recommendations for Other Services       Precautions / Restrictions Precautions Precautions: Fall Precaution Comments: Watch HR and O2 sats.      Mobility  Bed Mobility Overal bed mobility: Modified Independent             General bed mobility comments: in chair on arrival  Transfers Overall transfer level: Needs assistance Equipment used: None Transfers: Sit to/from Stand Sit to Stand: Supervision         General transfer comment: pt stood without assist but decreased control descent with cues to slow transition  Ambulation/Gait Ambulation/Gait assistance: Supervision Ambulation Distance (Feet): 75 Feet Assistive device: None Gait Pattern/deviations: Step-through pattern;Decreased stride length   Gait velocity interpretation: Below normal speed for age/gender General Gait Details: Pt with standing rest after 10', 28' and 50' to maintain sats because dropping to 85% on 6L and requires frequenct cues and rest breaks to maintain oxygen level   Stairs            Wheelchair Mobility    Modified Rankin (Stroke Patients Only)       Balance Overall balance assessment: Needs assistance   Sitting balance-Leahy Scale: Good       Standing balance-Leahy Scale: Good                       Cognition Arousal/Alertness: Awake/alert Behavior During Therapy: WFL for tasks assessed/performed Overall Cognitive Status: Within Functional Limits for tasks assessed                      Exercises General Exercises - Lower Extremity Long Arc Quad: AROM;Seated;Both;20 reps Hip ABduction/ADduction: AROM;Seated;Both;20 reps Hip Flexion/Marching: AROM;Seated;Both;20 reps Toe Raises: AROM;Seated;Both;20 reps Heel Raises: AROM;Seated;Both;20 reps    General Comments        Pertinent Vitals/Pain Pain Assessment: No/denies pain  sats 85-90% on 6L with gait HR 88-106 with gait    Home Living                      Prior Function            PT Goals (current goals can now be found in the care plan section) Acute Rehab PT Goals Patient Stated Goal: not stated Progress towards PT goals: Progressing toward goals    Frequency       PT Plan Current plan remains appropriate    Co-evaluation             End of Session Equipment Utilized During Treatment: Oxygen Activity Tolerance: Patient tolerated treatment well Patient left: in chair;with call bell/phone within reach     Time: 1237-1253 PT Time Calculation (min) (ACUTE ONLY): 16 min  Charges:  $Gait Training: 8-22 mins  G CodesMelford Aase 2014-12-25, 1:49 PM Elwyn Reach, [place]

## 2014-11-30 NOTE — Progress Notes (Signed)
ANTICOAGULATION CONSULT NOTE - Follow Up Consult  Pharmacy Consult for Coumadin Indication: atrial fibrillation  No Known Allergies  Patient Measurements: Height: 5\' 8"  (172.7 cm) Weight: 125 lb 1.6 oz (56.745 kg) IBW/kg (Calculated) : 68.4   Vital Signs: Temp: 98 F (36.7 C) (02/11 0443) Temp Source: Oral (02/11 0443) BP: 112/68 mmHg (02/11 0443) Pulse Rate: 84 (02/11 0443)  Labs:  Recent Labs  11/28/14 0348 11/29/14 0244 11/30/14 0547  HGB  --  11.2*  --   HCT  --  34.6*  --   PLT  --  307  --   LABPROT 21.1* 24.7* 27.7*  INR 1.80* 2.21* 2.56*  CREATININE  --  0.81  --     Estimated Creatinine Clearance: 50.6 mL/min (by C-G formula based on Cr of 0.81).   Assessment: 80 yo M who admitted 11/24/2014 Via the ED with SOB and cough.   Anticoagulation: Afib. Admit INR 2.49. Home dose 4.5mg  daily, except 3mg  on Sunday and Wednesday. (no dose 2/6 due to being NPO). INR  Therapeutic but graduallly increasing, 2.56 today. On antibiotics which affect level.  CBC stable. No bleeding reported.    Goal of Therapy:  INR 2-3   Plan:  Coumadin 3 mg po x 1 tonight. Daily INR  Isac Sarna, BS Pharm D, BCPS Clinical Pharmacist  11/30/2014 10:56 AM

## 2014-11-30 NOTE — Progress Notes (Signed)
[facility] TEAM 1 - Stepdown/ICU TEAM Progress Note  Logan [name] WCH:[id number] DOB: 1925-04-25 DOA: 11/24/2014 PCP: Leonard Downing, MD  Admit HPI / Brief Narrative: 81 y.o.WM PMHx O2 Dependent COPD 2L when exerting himself , Chronic Atrial Fibrillation, Systolic CHF, Dilated Cardiomyopathy, pulmonary hypertension, Hyperlipidemia, Hypothyroid, and previous Vocal Cord Cancer who presented to the ED with complaints of worsening SOB and cough X 1 day. He denied fever and chills, however he reported having weakness and DOE x 1 month. He was found to have hypoxemia in the ED, and required BIPAP. A CXR revealed asymmetric pulmonary edema versus infiltrates. He had a leukocytosis of 14.1and he was placed on IV Rocephin and Azithromycin initially.   HPI/Subjective: 2/11 A/Ox 4 , patient states understanding his O2 need has significantly increased, and states does not have a high flow regulator with his current equipment at home.    Assessment/Plan: Sepsis due to CAP -Continue antibiotic course for full 10 days secondary to patient's poor underlying plain function  -Clinically improving/stabilizing  -Patient can complete 10 day course of antibiotics at home after we ensure he has proper respiratory equipment. Will confer with CSW in a.m.; possible discharge  Acute exacerbation of COPD w/ acute on chronic hypoxic and hypercarbic resp failure  -Quit smoking in 1992  - chronically wears 2L when exerting himself or QHS - Pulmicort nebs  BID -DuoNeb QID -Mucomyst 2 with DuoNeb -Flutter valve -Mucinex DM BID 2/11SATURATION QUALIFICATIONS: (This note is used to comply with regulatory documentation for home oxygen) Patient Saturations on 3L at Rest = 90% Patient Saturations on 6L while Ambulating = 85-90% with cues for breathing Please briefly explain why patient needs home oxygen:pt with desaturation with all mobility even on 6L -Patient will require high flow regulator prior to  discharge  Chronic diastolic CHF - recovered dilated CM -EF 50-55% via TTE Jan 2015 - careful volume resuscitation completed - baseline wgt appears to be ~60kg - wgt stable at 59.1kg currently -Daily a.m. Weigh    2/11=56.7 kg -Strict in and out  Pulmonary hypertension -See chronic atrial fibrillation  Chronic atrial fibrillation on anticoagulation/A. fib RVR -rate control still not optimal.  -Continue digoxin 0.125 mg daily  -2/10 Digoxin level= 0.4 (low) -Continue metoprolol 25 mg  BID - continuing warfarin per pharmacy  -2/11 extra dose of digoxin 0.125 mg  HLD -Resume zocor 80 mg daily -Lipid panel  Hypothyroid  -Cont home dose of synthroid 75 g daily -2/10 TSH = 2.17   Hx of laryngeal CA s/p XRT    Code Status: FULL Family Communication: no family present at time of exam Disposition Plan: stable for transfer to med bed - begin PT/OT - assess need for change to 24hr oxygen, or higher dose - assess need for CIR or SNF rehab     Consultants:   Procedure/Significant Events: 11/02/13 echocardiogram;LVEF= 50% to 55%.  - Right ventricle:  mildly dilated.  - Right atrium: moderately dilated. - Pulmonary arteries: PA peak pressure: 6mm Hg (S).   Culture 2/6 blood right arm/hand NGTD 2/6 MRSA by PCR negative 2/9 sputum pending   Antibiotics: Vanc 2/5>> stopped 2/6 Cefepime 2/5>> stopped 2/6 Rocpehin 2/5 > stopped 2/9 Azithro 2/5 > Ceftin 2/10>>  DVT prophylaxis: Coumadin   Devices    LINES / TUBES:     Continuous Infusions:    Objective: VITAL SIGNS: Temp: 98.1 F (36.7 C) (02/11 2108) Temp Source: Oral (02/11 2108) BP: 102/57 mmHg (02/11 2108) Pulse Rate: 95 (02/11 2108)  SPO2; FIO2:   Intake/Output Summary (Last 24 hours) at 11/30/14 2138 Last data filed at 11/30/14 2110  Gross per 24 hour  Intake    520 ml  Output   1275 ml  Net   -755 ml     Exam: General: A/O 4, NAD, acute on chronic respiratory distress Lungs: left  lingula, left upper lobe rhonchi with extra toward wheezing, right upper lobe clear to auscultation, right middle lobe/right lower lobe wheezing  Cardiovascular: Irregular irregular rhythm and rate without murmur gallop or rub normal S1 and S2 Abdomen: Nontender, nondistended, soft, bowel sounds positive, no rebound, no ascites, no appreciable mass Extremities: No significant cyanosis, clubbing, or edema bilateral lower extremities  Data Reviewed: Basic Metabolic Panel:  Recent Labs Lab 11/24/14 2340 11/25/14 0412 11/26/14 0255 11/27/14 0054 11/29/14 0244  NA 140 139 139 140 140  K 4.1 4.5 4.3 4.2 4.2  CL 101 101 102 107 103  CO2 32 34* 33* 30 32  GLUCOSE 173* 149* 120* 114* 125*  BUN 21 16 13 12 17   CREATININE 0.98 0.86 0.82 0.77 0.81  CALCIUM 9.2 8.8 8.3* 8.4 8.6  MG  --   --   --   --  2.1   Liver Function Tests:  Recent Labs Lab 11/24/14 2340 11/26/14 0255 11/27/14 0054 11/29/14 0244  AST 23 16 18 21   ALT 15 12 13 16   ALKPHOS 96 70 73 72  BILITOT 0.6 0.9 0.4 0.4  PROT 7.1 5.7* 5.9* 5.7*  ALBUMIN 3.5 2.6* 2.5* 2.5*   No results for input(s): LIPASE, AMYLASE in the last 168 hours. No results for input(s): AMMONIA in the last 168 hours. CBC:  Recent Labs Lab 11/24/14 2340 11/25/14 0412 11/27/14 0054 11/29/14 0244  WBC 14.1* 19.1* 11.7* 9.9  NEUTROABS 11.2*  --   --  7.2  HGB 13.8 12.4* 11.3* 11.2*  HCT 41.8 38.0* 34.6* 34.6*  MCV 97.2 96.2 96.1 95.8  PLT 324 272 267 307   Cardiac Enzymes: No results for input(s): CKTOTAL, CKMB, CKMBINDEX, TROPONINI in the last 168 hours. BNP (last 3 results)  Recent Labs  11/24/14 2340  BNP 79.8    ProBNP (last 3 results)  Recent Labs  12/21/13 1049 03/06/14 1226  PROBNP 206.0* 164.0*    CBG: No results for input(s): GLUCAP in the last 168 hours.  Recent Results (from the past 240 hour(s))  Culture, blood (routine x 2)     Status: None (Preliminary result)   Collection Time: 11/25/14 12:20 AM  Result  Value Ref Range Status   Specimen Description BLOOD RIGHT ARM  Final   Special Requests BOTTLES DRAWN AEROBIC AND ANAEROBIC 10CC EACH  Final   Culture   Final           BLOOD CULTURE RECEIVED NO GROWTH TO DATE CULTURE WILL BE HELD FOR 5 DAYS BEFORE ISSUING A FINAL NEGATIVE REPORT Note: Culture results may be compromised due to an excessive volume of blood received in culture bottles. Performed at [facility]    Report Status PENDING  Incomplete  Culture, blood (routine x 2)     Status: None (Preliminary result)   Collection Time: 11/25/14 12:28 AM  Result Value Ref Range Status   Specimen Description BLOOD RIGHT HAND  Final   Special Requests BOTTLES DRAWN AEROBIC ONLY 5CC  Final   Culture   Final           BLOOD CULTURE RECEIVED NO GROWTH TO DATE CULTURE WILL BE  HELD FOR 5 DAYS BEFORE ISSUING A FINAL NEGATIVE REPORT Performed at [facility]    Report Status PENDING  Incomplete  MRSA PCR Screening     Status: None   Collection Time: 11/25/14  3:38 PM  Result Value Ref Range Status   MRSA by PCR NEGATIVE NEGATIVE Final    Comment:        The GeneXpert MRSA Assay (FDA approved for NASAL specimens only), is one component of a comprehensive MRSA colonization surveillance program. It is not intended to diagnose MRSA infection nor to guide or monitor treatment for MRSA infections.   Culture, expectorated sputum-assessment     Status: None   Collection Time: 11/28/14  4:15 PM  Result Value Ref Range Status   Specimen Description SPUTUM  Final   Special Requests NONE  Final   Sputum evaluation   Final    THIS SPECIMEN IS ACCEPTABLE. RESPIRATORY CULTURE REPORT TO FOLLOW.   Report Status 11/28/2014 FINAL  Final  Culture, respiratory (NON-Expectorated)     Status: None (Preliminary result)   Collection Time: 11/28/14  4:42 PM  Result Value Ref Range Status   Specimen Description SPUTUM  Final   Special Requests NONE  Final   Gram Stain   Final    ABUNDANT WBC  PRESENT, PREDOMINANTLY PMN RARE SQUAMOUS EPITHELIAL CELLS PRESENT NO ORGANISMS SEEN Performed at [facility]    Culture   Final    MODERATE CANDIDA ALBICANS Performed at [facility]    Report Status PENDING  Incomplete     Studies:  Recent x-ray studies have been reviewed in detail by the Attending Physician  Scheduled Meds:  Scheduled Meds: . antiseptic oral rinse  7 mL Mouth Rinse BID  . aspirin  81 mg Oral Daily  . atorvastatin  40 mg Oral q1800  . azithromycin  500 mg Oral Q24H  . budesonide (PULMICORT) nebulizer solution  0.25 mg Nebulization BID  . cefUROXime  500 mg Oral BID WC  . digoxin  0.125 mg Oral Daily  . digoxin  0.125 mg Oral Once  . furosemide  40 mg Oral Daily  . ipratropium  0.5 mg Nebulization Q6H  . levalbuterol  0.63 mg Nebulization Q6H  . levothyroxine  75 mcg Oral QAC breakfast  . metoprolol tartrate  25 mg Oral BID  . multivitamin with minerals  1 tablet Oral Daily  . tamsulosin  0.4 mg Oral QPC breakfast  . Warfarin - Pharmacist Dosing Inpatient   Does not apply q1800    Time spent on care of this patient: 40 mins   Allie Bossier [facility]  [facility] Office  [phone] Pager - [phone]  On-Call/Text Page:      [email or web address]      password [email or web address]  If 7PM-7AM, please contact night-coverage [email or web address] Password [facility] 11/30/2014, 9:38 PM   LOS: 5 days   Care during the described time interval was provided by me .  I have reviewed this patient's available data, including medical history, events of note, physical examination, radiology studies and test results as part of my evaluation  Dia Crawford, MD [phone] Pager

## 2014-11-30 NOTE — Progress Notes (Signed)
Occupational Therapy Treatment Patient Details Name: Logan [name] MRN: [id number] DOB: 1925-02-27 Today's Date: 11/30/2014    History of present illness pt presents with Sepsis, COPD Exacerbation, and CAP with hx of CHF.     OT comments  Focus of session on standing grooming and dressing and toilet transfers.  Pt declining bathing.  Instructed in breathing techniques and to slow pace. Encouraged incentive spirometer.  Follow Up Recommendations  No OT follow up;Supervision/Assistance - 24 hour    Equipment Recommendations  None recommended by OT    Recommendations for Other Services      Precautions / Restrictions Precautions Precautions: Fall Precaution Comments: Watch HR and O2 sats.   Restrictions Weight Bearing Restrictions: No       Mobility Bed Mobility Overal bed mobility: Modified Independent                Transfers Overall transfer level: Needs assistance Equipment used: None Transfers: Sit to/from Stand Sit to Stand: Supervision              Balance     Sitting balance-Leahy Scale: Good       Standing balance-Leahy Scale: Fair                     ADL Overall ADL's : Needs assistance/impaired     Grooming: Wash/dry hands;Wash/dry face;Oral care;Standing;Min guard     Upper Body Bathing Details (indicate cue type and reason): instructed in use of long bath sponge     Upper Body Dressing : Set up;Standing   Lower Body Dressing: Sit to/from stand;Supervision/safety   Toilet Transfer: Min guard;Ambulation   Toileting- Clothing Manipulation and Hygiene: Supervision/safety;Sit to/from stand       Functional mobility during ADLs: Min guard General ADL Comments: Educated on energy conservation techniques and deep breathing technique.       Vision                     Perception     Praxis      Cognition   Behavior During Therapy: WFL for tasks assessed/performed Overall Cognitive Status: Within  Functional Limits for tasks assessed                       Extremity/Trunk Assessment               Exercises     Shoulder Instructions       General Comments      Pertinent Vitals/ Pain       Pain Assessment: No/denies pain  Home Living                                          Prior Functioning/Environment              Frequency Min 2X/week     Progress Toward Goals  OT Goals(current goals can now be found in the care plan section)  Progress towards OT goals: Progressing toward goals  Acute Rehab OT Goals Patient Stated Goal: not stated  Plan Discharge plan remains appropriate    Co-evaluation                 End of Session Equipment Utilized During Treatment: Gait belt;Oxygen (3L)   Activity Tolerance Patient limited by fatigue   Patient Left in chair;with call bell/phone within reach;with nursing/sitter in  room;with chair alarm set   Nurse Communication          Time: [phone] OT Time Calculation (min): 32 min  Charges: OT General Charges $OT Visit: 1 Procedure OT Treatments $Self Care/Home Management : 23-37 mins  Malka So 11/30/2014, 11:46 AM  [phone]

## 2014-12-01 LAB — CULTURE, RESPIRATORY

## 2014-12-01 LAB — PROTIME-INR
INR: 2.62 — ABNORMAL HIGH (ref 0.00–1.49)
PROTHROMBIN TIME: 28.2 s — AB (ref 11.6–15.2)

## 2014-12-01 LAB — CULTURE, BLOOD (ROUTINE X 2)
CULTURE: NO GROWTH
Culture: NO GROWTH

## 2014-12-01 LAB — CULTURE, RESPIRATORY W GRAM STAIN

## 2014-12-01 MED ORDER — WARFARIN SODIUM 4 MG PO TABS
4.5000 mg | ORAL_TABLET | Freq: Once | ORAL | Status: DC
  Filled 2014-12-01: qty 1

## 2014-12-01 MED ORDER — AZITHROMYCIN 500 MG PO TABS: 500.0000 mg | ORAL_TABLET | ORAL | Status: AC

## 2014-12-01 MED ORDER — CEFUROXIME AXETIL 500 MG PO TABS: 500.0000 mg | ORAL_TABLET | Freq: Two times a day (BID) | ORAL | Status: AC

## 2014-12-01 NOTE — Progress Notes (Signed)
Physical Therapy Treatment Patient Details Name: Logan [name] MRN: [id number] DOB: [birth year] Today's Date: 2014/12/29    History of Present Illness pt presents with Sepsis, COPD Exacerbation, and CAP with hx of CHF.      PT Comments    Pt with improved activity tolerance and gait today. Able to complete 150' with sats 86-93% on 6L with gait with standing rest 4 min to recover sats. Pt encouraged to continue gradual increase to activity, HEP, and practiced IS as well. Will continue to follow  Follow Up Recommendations  Home health PT;Supervision - Intermittent     Equipment Recommendations       Recommendations for Other Services       Precautions / Restrictions Precautions Precautions: Fall Precaution Comments: Watch HR and O2 sats.      Mobility  Bed Mobility Overal bed mobility: Modified Independent                Transfers Overall transfer level: Modified independent                  Ambulation/Gait Ambulation/Gait assistance: Supervision Ambulation Distance (Feet): 150 Feet Assistive device: None Gait Pattern/deviations: Step-through pattern;Decreased stride length   Gait velocity interpretation: Below normal speed for age/gender General Gait Details: 4 min standing rest after 56' with cues for posture, breathing technique and monitoring fatigue   Stairs            Wheelchair Mobility    Modified Rankin (Stroke Patients Only)       Balance                                    Cognition Arousal/Alertness: Awake/alert Behavior During Therapy: WFL for tasks assessed/performed Overall Cognitive Status: Within Functional Limits for tasks assessed                      Exercises General Exercises - Lower Extremity Long Arc Quad: AROM;Seated;Both;20 reps Hip ABduction/ADduction: AROM;Seated;Both;20 reps Hip Flexion/Marching: AROM;Seated;Both;20 reps Toe Raises: AROM;Seated;Both;20 reps Heel Raises:  AROM;Seated;Both;20 reps    General Comments        Pertinent Vitals/Pain Pain Assessment: No/denies pain         HR 100-125 with gait  Home Living                      Prior Function            PT Goals (current goals can now be found in the care plan section) Progress towards PT goals: Progressing toward goals    Frequency       PT Plan Current plan remains appropriate    Co-evaluation             End of Session Equipment Utilized During Treatment: Oxygen Activity Tolerance: Patient tolerated treatment well Patient left: in chair;with call bell/phone within reach;with chair alarm set     Time: 6203-5597 PT Time Calculation (min) (ACUTE ONLY): 23 min  Charges:  $Gait Training: 8-22 mins $Therapeutic Exercise: 8-22 mins                    G Codes:      Melford Aase December 29, 2014, 9:32 AM Elwyn Reach, [place]

## 2014-12-01 NOTE — Care Management Note (Signed)
    Page 1 of 2   12/01/2014     12:52:13 PM CARE MANAGEMENT NOTE 12/01/2014  Patient:  Logan [name], Logan [name]   Account Number:  [id number]  Date Initiated:  11/28/2014  Documentation initiated by:  Marvetta Gibbons  Subjective/Objective Assessment:   Pt admitted with CAP, now with afib     Action/Plan:   PTA pt lived at home with wife, as home 02 iwth [facility] (2L prn and HS)   Anticipated DC Date:  12/01/2014   Anticipated DC Plan:  [place]  CM consult      [place]   Choice offered to / List presented to:  C-1 Patient   DME arranged  [facility]      DME agency  [place] arranged  [place] - 11 Patient Refused      Status of service:  Completed, signed off Medicare Important Message given?  YES (If response is "NO", the following Medicare IM given date fields will be blank) Date Medicare IM given:  11/28/2014 Medicare IM given by:  Marvetta Gibbons Date Additional Medicare IM given:  12/01/2014 Additional Medicare IM given by:  Marvetta Gibbons  Discharge Disposition:  HOME/SELF CARE  Per UR Regulation:  Reviewed for med. necessity/level of care/duration of stay  If discussed at [place] of Stay Meetings, dates discussed:   11/30/2014    Comments:  12/01/14- 1200- Marvetta Gibbons RN, BSN [phone] Pt for discharge today needs a high flow regulator for home- pt already has home 02 with [facility] spoke with Jermaine with [facility] who will f/u with main office regarding changing out pt's equipment at home once DME order placed for new liter flow and need  for change in equipment at home (pt now on 3L continuos and 6L on exertion)- spoke with pt at bedside- explained that family would need to bring tank with them for trip home- and that [facility] would be out today to change equipment. Pt also has order for HH-PT politely refused services- states that he does not feel like he needs [facility] services. No  referral made for [facility].

## 2014-12-01 NOTE — Progress Notes (Signed)
Medicare Important Message given? YES  (If response is "NO", the following Medicare IM given date fields will be blank)  Date Medicare IM given: 12/01/14 Medicare IM given by:  Dahlia Client [employer]

## 2014-12-01 NOTE — Discharge Instructions (Signed)

## 2014-12-01 NOTE — Progress Notes (Signed)
ANTICOAGULATION CONSULT NOTE - Follow Up Consult  Pharmacy Consult for Coumadin Indication: atrial fibrillation  No Known Allergies  Patient Measurements: Height: 5\' 8"  (172.7 cm) Weight: 127 lb 1.6 oz (57.652 kg) IBW/kg (Calculated) : 68.4   Vital Signs: Temp: 97.6 F (36.4 C) (02/12 0535) Temp Source: Oral (02/12 0535) BP: 112/66 mmHg (02/12 0535) Pulse Rate: 90 (02/12 0739)  Labs:  Recent Labs  11/29/14 0244 11/30/14 0547 12/01/14 0415  HGB 11.2*  --   --   HCT 34.6*  --   --   PLT 307  --   --   LABPROT 24.7* 27.7* 28.2*  INR 2.21* 2.56* 2.62*  CREATININE 0.81  --   --     Estimated Creatinine Clearance: 51.4 mL/min (by C-G formula based on Cr of 0.81).   Assessment: 81 yo M who admitted 11/24/2014 Via the ED with SOB and cough.   Anticoagulation: Afib. Admit INR 2.49. Home dose 4.5mg  daily, except 3mg  on Sunday and Wednesday. (no dose 2/6 due to being NPO). INR  Therapeutic but graduallly increasing, 2.62 today. On antibiotics which affect level.  CBC stable. No bleeding reported.    Goal of Therapy:  INR 2-3   Plan:  Coumadin 4.5 mg po x 1 tonight. Daily INR  Thank you. Anette Guarneri, PharmD [phone]  12/01/2014 9:11 AM

## 2014-12-01 NOTE — Progress Notes (Signed)
ED CM received call from [place] on 2W concernig oxygen conserver gage to be delivered to patient's home from [facility]. Contacted [facility] at [phone] spoke with stephanie she states the conserver will be delivered tomorrow 2/13. ED CM updated patient, no further ED CM needs identified.

## 2014-12-01 NOTE — Discharge Summary (Signed)
Physician Discharge Summary  MCKINLEY OLHEISER UOH:[id number] DOB: 1925-09-09 DOA: 11/24/2014  PCP: Leonard Downing, MD  Admit date: 11/24/2014 Discharge date: 12/01/2014  Time spent: 45 minutes  Recommendations for Outpatient Follow-up:  Patient will be discharged home with home health physical therapy as well as oxygen. Patient will need to follow-up with his primary care physician within one week of discharge. Patient to continue his medications as prescribed. Patient should follow a heart healthy diet and continue activity as tolerated.  Discharge Diagnoses:  Sepsis secondary to community-acquired pneumonia Acute exacerbation of COPD, acute hypoxic respiratory failure Chronic diastolic heart failure Pulmonary hypertension Chronic atrial fibrillation on anticoagulation Hyperlipidemia Hypothyroidism History of laryngeal cancer Physical deconditioning  Discharge Condition: Stable  Diet recommendation: Heart healthy  Filed Weights   11/29/14 0400 11/30/14 0443 12/01/14 0535  Weight: 58.4 kg (128 lb 12 oz) 56.745 kg (125 lb 1.6 oz) 57.652 kg (127 lb 1.6 oz)    History of present illness:  By Dr. Jana Hakim on 11/25/2014 Logan [name] is a 81 y.o. male with a history of O2 Dependent COPD, Chronic Atrial Fibrillation, Systolic CHF, Dilated Cardiomyopathy, Hyperlipidemia, Hypothyroid, and previous Vocal Cord Cancer who presents to the ED with complaints of worsening SOB and Cough X 1 day. He denies fever and chills, however he reports having weakness and DOE x 1 month. He was found to have hypoemia in the ED, and required BIPAP, and a Chest X-ray revealed Asymmetric Pulmonary edema versus Infiltrates. He also had a leukocytosis of 14.1and he was placed on IV Rocephin and Azithromycin initially.   Hospital Course:  Sepsis due to CAP -Patient is placed on IV antibiotics however transitioned to azithromycin and Ceftin -Clinically improving -Continue azithromycin and  Ceftin for 10 days total  Acute exacerbation of COPD w/ acute on chronic hypoxic and hypercarbic resp failure  -Quit smoking in 1992  -Patient uses 2 L of oxygen at home chronically however desaturates upon ambulation  -O2 sats were measured with rest, on 3 L 90%, will ambulate and required 6 L to maintain saturations between 85-90%  -Patient will need to be discharged with 6 L with ambulation as well as high flow regulator  -Continue home regimen, antitussives, flutter valve, incentive spirometer   Chronic diastolic CHF - recovered dilated CM -EF 50-55% via TTE Jan 2015 -Monitored patient's daily weights as well as intake and output -Patient currently euvolemic and compensated  Pulmonary hypertension -See chronic atrial fibrillation  Chronic atrial fibrillation on anticoagulation/A. fib RVR -rate control still not optimal.  -Continue digoxin 0.125 mg daily  -2/10 Digoxin level= 0.4 (low)-patient was given an additional dose of digoxin -Continue metoprolol 25 mg BID and Coumadin  Hyperlipidemia -Resume zocor 80 mg daily -Lipid panel: LDL 138, triglycerides 58, HDL 36, LDL 90  Hypothyroid  -TSH 2.17 -Continue Synthroid  Hx of laryngeal CA s/p XRT  Physical deconditioning -PT consulted and recommended home health  CODE STATUS: DO NOT RESUSCITATE  Procedures None  Consultations: None  Discharge Exam: Filed Vitals:   12/01/14 1034  BP: 94/56  Pulse:   Temp:   Resp:      General: Well developed, thin, NAD, appears stated age  HEENT: NCAT,, mucous membranes moist.  Cardiovascular: S1 S2 auscultated, irregular  Respiratory: Expiratory wheezing noted.  Abdomen: Soft, nontender, nondistended, + bowel sounds  Extremities: warm dry without cyanosis clubbing or edema  Neuro: AAOx3, nonfocal  Psych: Normal affect and demeanor with intact judgement and insight  Discharge Instructions  Discharge Instructions    Discharge instructions    Complete  by:  As directed   Patient will be discharged home with home health physical therapy as well as oxygen. Patient will need to follow-up with his primary care physician within one week of discharge. Patient to continue his medications as prescribed. Patient should follow a heart healthy diet and continue activity as tolerated.     Increase activity slowly    Complete by:  As directed             Medication List    STOP taking these medications        predniSONE 10 MG tablet  Commonly known as:  DELTASONE      TAKE these medications        aspirin 81 MG tablet  Take 81 mg by mouth daily.     azithromycin 500 MG tablet  Commonly known as:  ZITHROMAX  Take 1 tablet (500 mg total) by mouth daily.     CAL-MAG-ZINC PO  Take 1 tablet by mouth daily.     cefUROXime 500 MG tablet  Commonly known as:  CEFTIN  Take 1 tablet (500 mg total) by mouth 2 (two) times daily with a meal.     dextromethorphan-guaiFENesin 30-600 MG per 12 hr tablet  Commonly known as:  MUCINEX DM  Take 1-2 tablets by mouth every 12 (twelve) hours as needed (cough, congestion).     digoxin 0.125 MG tablet  Commonly known as:  LANOXIN  Take 1 tablet (0.125 mg total) by mouth daily.     furosemide 40 MG tablet  Commonly known as:  LASIX  Take 40 mg by mouth daily. May take 1 extra daily as needed for leg swelling     levothyroxine 75 MCG tablet  Commonly known as:  SYNTHROID, LEVOTHROID  TAKE 1 TABLET (75 MCG TOTAL) BY MOUTH DAILY.     losartan 25 MG tablet  Commonly known as:  COZAAR  Take 25 mg by mouth daily.     metoprolol tartrate 25 MG tablet  Commonly known as:  LOPRESSOR  Take 1 tablet (25 mg total) by mouth 2 (two) times daily.     multivitamin tablet  Take 1 tablet by mouth daily.     PROAIR HFA 108 (90 BASE) MCG/ACT inhaler  Generic drug:  albuterol  Inhale 2 puffs into the lungs every 4 (four) hours as needed for wheezing or shortness of breath.     simvastatin 80 MG tablet    Commonly known as:  ZOCOR  Take 1 tablet (80 mg total) by mouth at bedtime.     tamsulosin 0.4 MG Caps capsule  Commonly known as:  FLOMAX  Take 0.4 mg by mouth daily after breakfast.     Umeclidinium-Vilanterol 62.5-25 MCG/INH Aepb  Inhale 1 puff into the lungs daily.     warfarin 3 MG tablet  Commonly known as:  COUMADIN  Take as directed per the coumadin clinic       No Known Allergies Follow-up Information    Follow up with Leonard Downing, MD. Schedule an appointment as soon as possible for a visit in 1 week.   Specialty:  Family Medicine   Why:  Hospital followup   Contact information:   [place] [id number] [phone]        The results of significant diagnostics from this hospitalization (including imaging, microbiology, ancillary and laboratory) are listed below for reference.    Significant Diagnostic  Studies: Dg Chest Port 1 View  11/29/2014   CLINICAL DATA:  Followup of pneumonia.  EXAM: PORTABLE CHEST - 1 VIEW  COMPARISON:  12/07/2014  FINDINGS: Hyperinflation. Numerous leads and wires project over the chest. Midline trachea. Mild cardiomegaly with transverse aortic atherosclerosis. Probable small bilateral pleural effusions. No pneumothorax. Interstitial edema is moderate. Lower lobe predominant airspace opacities are not significantly changed.  IMPRESSION: No significant change since the prior exam.  Congestive heart failure with small bilateral pleural effusions.  Bibasilar airspace disease which could represent atelectasis or concurrent infection.   Electronically Signed   By: Abigail Miyamoto M.D.   On: 11/29/2014 08:12   Dg Chest Port 1 View  11/27/2014   CLINICAL DATA:  Pneumonia.  EXAM: PORTABLE CHEST - 1 VIEW  COMPARISON:  11/24/2014.  FINDINGS: Mediastinum hilar structures are normal. Persistent bilateral pulmonary alveolar infiltrates again noted, no change. Persistent cardiomegaly. Small left pleural effusion. No pneumothorax.   IMPRESSION: 1. Persistent bilateral pulmonary infiltrates without change. These patch that could be secondary to pulmonary edema and/or pneumonia. No interim change. 2. Persistent cardiomegaly. No prominent pulmonary venous congestion .   Electronically Signed   By: Marcello Moores  Register   On: 11/27/2014 07:40   Dg Chest Portable 1 View  11/25/2014   CLINICAL DATA:  Shortness of breath today.  EXAM: PORTABLE CHEST - 1 VIEW  COMPARISON:  05/16/2014  FINDINGS: The heart is mildly enlarged but stable. There is tortuosity and calcification of the thoracic aorta. Severe emphysematous changes with superimposed asymmetric pulmonary edema or pulmonary infiltrates. No definite pleural effusions. The bony thorax is intact.  IMPRESSION: Underlying emphysematous changes with superimposed asymmetric airspace process, asymmetric pulmonary edema versus infiltrates.   Electronically Signed   By: Kalman Jewels M.D.   On: 11/25/2014 00:08    Microbiology: Recent Results (from the past 240 hour(s))  Culture, blood (routine x 2)     Status: None   Collection Time: 11/25/14 12:20 AM  Result Value Ref Range Status   Specimen Description BLOOD RIGHT ARM  Final   Special Requests BOTTLES DRAWN AEROBIC AND ANAEROBIC 10CC EACH  Final   Culture   Final    NO GROWTH 5 DAYS Note: Culture results may be compromised due to an excessive volume of blood received in culture bottles. Performed at [facility]    Report Status 12/01/2014 FINAL  Final  Culture, blood (routine x 2)     Status: None   Collection Time: 11/25/14 12:28 AM  Result Value Ref Range Status   Specimen Description BLOOD RIGHT HAND  Final   Special Requests BOTTLES DRAWN AEROBIC ONLY 5CC  Final   Culture   Final    NO GROWTH 5 DAYS Performed at [facility]    Report Status 12/01/2014 FINAL  Final  MRSA PCR Screening     Status: None   Collection Time: 11/25/14  3:38 PM  Result Value Ref Range Status   MRSA by PCR NEGATIVE NEGATIVE Final     Comment:        The GeneXpert MRSA Assay (FDA approved for NASAL specimens only), is one component of a comprehensive MRSA colonization surveillance program. It is not intended to diagnose MRSA infection nor to guide or monitor treatment for MRSA infections.   Culture, expectorated sputum-assessment     Status: None   Collection Time: 11/28/14  4:15 PM  Result Value Ref Range Status   Specimen Description SPUTUM  Final   Special Requests NONE  Final   Sputum evaluation   Final    THIS SPECIMEN IS ACCEPTABLE. RESPIRATORY CULTURE REPORT TO FOLLOW.   Report Status 11/28/2014 FINAL  Final  Culture, respiratory (NON-Expectorated)     Status: None   Collection Time: 11/28/14  4:42 PM  Result Value Ref Range Status   Specimen Description SPUTUM  Final   Special Requests NONE  Final   Gram Stain   Final    ABUNDANT WBC PRESENT, PREDOMINANTLY PMN RARE SQUAMOUS EPITHELIAL CELLS PRESENT NO ORGANISMS SEEN Performed at [facility]    Culture   Final    MODERATE CANDIDA ALBICANS Performed at [facility]    Report Status 12/01/2014 FINAL  Final     Labs: Basic Metabolic Panel:  Recent Labs Lab 11/24/14 2340 11/25/14 0412 11/26/14 0255 11/27/14 0054 11/29/14 0244  NA 140 139 139 140 140  K 4.1 4.5 4.3 4.2 4.2  CL 101 101 102 107 103  CO2 32 34* 33* 30 32  GLUCOSE 173* 149* 120* 114* 125*  BUN 21 16 13 12 17   CREATININE 0.98 0.86 0.82 0.77 0.81  CALCIUM 9.2 8.8 8.3* 8.4 8.6  MG  --   --   --   --  2.1   Liver Function Tests:  Recent Labs Lab 11/24/14 2340 11/26/14 0255 11/27/14 0054 11/29/14 0244  AST 23 16 18 21   ALT 15 12 13 16   ALKPHOS 96 70 73 72  BILITOT 0.6 0.9 0.4 0.4  PROT 7.1 5.7* 5.9* 5.7*  ALBUMIN 3.5 2.6* 2.5* 2.5*   No results for input(s): LIPASE, AMYLASE in the last 168 hours. No results for input(s): AMMONIA in the last 168 hours. CBC:  Recent Labs Lab 11/24/14 2340 11/25/14 0412 11/27/14 0054 11/29/14 0244  WBC  14.1* 19.1* 11.7* 9.9  NEUTROABS 11.2*  --   --  7.2  HGB 13.8 12.4* 11.3* 11.2*  HCT 41.8 38.0* 34.6* 34.6*  MCV 97.2 96.2 96.1 95.8  PLT 324 272 267 307   Cardiac Enzymes: No results for input(s): CKTOTAL, CKMB, CKMBINDEX, TROPONINI in the last 168 hours. BNP: BNP (last 3 results)  Recent Labs  11/24/14 2340  BNP 79.8    ProBNP (last 3 results)  Recent Labs  12/21/13 1049 03/06/14 1226  PROBNP 206.0* 164.0*    CBG: No results for input(s): GLUCAP in the last 168 hours.     SignedCristal Ford  [facility] 12/01/2014, 10:59 AM

## 2014-12-04 ENCOUNTER — Ambulatory Visit: Payer: Medicare Other | Admitting: Cardiology

## 2014-12-18 ENCOUNTER — Ambulatory Visit (INDEPENDENT_AMBULATORY_CARE_PROVIDER_SITE_OTHER): Payer: Medicare Other | Admitting: *Deleted

## 2014-12-18 DIAGNOSIS — I4891 Unspecified atrial fibrillation: Secondary | ICD-10-CM

## 2014-12-18 DIAGNOSIS — Z5181 Encounter for therapeutic drug level monitoring: Secondary | ICD-10-CM

## 2014-12-18 LAB — POCT INR: INR: 3.2

## 2015-01-01 ENCOUNTER — Ambulatory Visit (INDEPENDENT_AMBULATORY_CARE_PROVIDER_SITE_OTHER): Payer: Medicare Other | Admitting: *Deleted

## 2015-01-01 DIAGNOSIS — I4891 Unspecified atrial fibrillation: Secondary | ICD-10-CM

## 2015-01-01 DIAGNOSIS — Z5181 Encounter for therapeutic drug level monitoring: Secondary | ICD-10-CM

## 2015-01-01 LAB — POCT INR: INR: 2.8

## 2015-01-12 ENCOUNTER — Ambulatory Visit (INDEPENDENT_AMBULATORY_CARE_PROVIDER_SITE_OTHER): Payer: Medicare Other | Admitting: Cardiology

## 2015-01-12 ENCOUNTER — Encounter: Payer: Self-pay | Admitting: Cardiology

## 2015-01-12 VITALS — BP 100/60 | HR 88 | Ht 68.0 in | Wt 128.7 lb

## 2015-01-12 DIAGNOSIS — I482 Chronic atrial fibrillation, unspecified: Secondary | ICD-10-CM

## 2015-01-12 DIAGNOSIS — J9612 Chronic respiratory failure with hypercapnia: Secondary | ICD-10-CM

## 2015-01-12 DIAGNOSIS — I5032 Chronic diastolic (congestive) heart failure: Secondary | ICD-10-CM

## 2015-01-12 DIAGNOSIS — Z7901 Long term (current) use of anticoagulants: Secondary | ICD-10-CM | POA: Diagnosis not present

## 2015-01-12 NOTE — Progress Notes (Signed)
Logan [name] Date of Birth: Mar 20, 1925   History of Present Illness: Logan [name] is seen for followup Afib today. He has a history of permanent atrial fibrillation managed with rate control and anticoagulation. He also has chronic diastolic heart failure with last ejection fraction of 55-60%. He was initially scheduled to see me in January but had to cancel due to weather. He was admitted to the hospital in February with PNA with sepsis and respiratory failure. He improved with antibiotic Rx. No evidence of CHF. Afib rate did increase but is improved now.  He has done very well since then. His breathing is doing well. He is on home oxygen.  He takes his lasix only when he has increased swelling. INRs have been therapeutic. No chest pain or swelling. Weight is down 5 lbs.  Current Outpatient Prescriptions on File Prior to Visit  Medication Sig Dispense Refill  . aspirin 81 MG tablet Take 81 mg by mouth daily.    . Calcium-Magnesium-Zinc (CAL-MAG-ZINC PO) Take 1 tablet by mouth daily.     Marland Kitchen dextromethorphan-guaiFENesin (MUCINEX DM) 30-600 MG per 12 hr tablet Take 1-2 tablets by mouth every 12 (twelve) hours as needed (cough, congestion).    Marland Kitchen digoxin (LANOXIN) 0.125 MG tablet Take 1 tablet (0.125 mg total) by mouth daily. 30 tablet 11  . furosemide (LASIX) 40 MG tablet Take 40 mg by mouth daily. May take 1 extra daily as needed for leg swelling    . levothyroxine (SYNTHROID, LEVOTHROID) 75 MCG tablet TAKE 1 TABLET (75 MCG TOTAL) BY MOUTH DAILY. 30 tablet 6  . losartan (COZAAR) 25 MG tablet Take 25 mg by mouth daily.     . Multiple Vitamin (MULTIVITAMIN) tablet Take 1 tablet by mouth daily.     Marland Kitchen PROAIR HFA 108 (90 BASE) MCG/ACT inhaler Inhale 2 puffs into the lungs every 4 (four) hours as needed for wheezing or shortness of breath.     . simvastatin (ZOCOR) 80 MG tablet Take 1 tablet (80 mg total) by mouth at bedtime. 30 tablet 11  . tamsulosin (FLOMAX) 0.4 MG CAPS capsule Take 0.4 mg by  mouth daily after breakfast.     . Umeclidinium-Vilanterol 62.5-25 MCG/INH AEPB Inhale 1 puff into the lungs daily. 1 each 11  . warfarin (COUMADIN) 3 MG tablet Take as directed per the coumadin clinic (Patient taking differently: Take 3-4.5 mg by mouth daily. Take 3mg  on Wednesday and Sunday. All other days take 4.5mg .) 135 tablet 0   No current facility-administered medications on file prior to visit.    No Known Allergies  Past Medical History  Diagnosis Date  . Chronic atrial fibrillation   . Dilated cardiomyopathy   . PVC's (premature ventricular contractions)   . Hyperlipidemia   . COPD (chronic obstructive pulmonary disease)   . Hypothyroidism   . Renal calculi   . Chronic anticoagulation   . CHF (congestive heart failure)     DUE TO SYSTOLIC DYSFUNCTION  . Shortness of breath     exertional  . Cancer     vocal cord - radiation     Past Surgical History  Procedure Laterality Date  . Cardiac catheterization  02/26/2007    EF 35-40%  . Appendectomy    . Tonsillectomy and adenoidectomy    . US echocardiography  07/08/2010    EF 50-55%  . US echocardiography  02/16/2007    EF 25-35%  . Transthoracic echocardiogram  08/26/2001    EF 25-30%  . Cardiovascular stress  test  02/18/2007    EF 40%  . Microlaryngoscopy with co2 laser and excision of vocal cord lesion      15 years ago  . Lithotripsy    . Inguinal hernia repair Right 12/28/2012    Procedure: HERNIA REPAIR INGUINAL ADULT;  Surgeon: Joyice Faster. Cornett, MD;  Location: [facility];  Service: General;  Laterality: Right;    History  Smoking status  . Former Smoker -- 1.00 packs/day for 35 years  . Types: Cigarettes  . Quit date: 07/11/1961  Smokeless tobacco  . Not on file    History  Alcohol Use No    Family History  Problem Relation Age of Onset  . Stroke Mother   . Seizures Mother   . Heart attack Father   . Heart failure Father   . Heart attack Brother     Review of Systems: As per history of present  illness.  All other systems were reviewed and are negative.  Physical Exam: BP 100/60 mmHg  Pulse 88  Ht 5\' 8"  (1.727 m)  Wt 128 lb 11.2 oz (58.378 kg)  BMI 19.57 kg/m2 He is a pleasant white male in no acute distress. The HEENT exam is normal. The carotids are 2+ without bruits.  There is no thyromegaly.  There is no JVD.  The lungs reveal diminished BS throughout. No rales.  The heart exam reveals an irregular rate with a normal S1 and S2.  There are no murmurs, gallops, or rubs.  The PMI is not displaced.   Abdominal exam reveals good bowel sounds.  There is no hepatosplenomegaly or tenderness.    Exam of the legs no edema.  Pedal pulses are intact.  Cranial nerves II - XII are intact.  Motor and sensory functions are intact.  The gait is normal.  LABORATORY DATA: Lab Results  Component Value Date   WBC 9.9 11/29/2014   HGB 11.2* 11/29/2014   HCT 34.6* 11/29/2014   PLT 307 11/29/2014   GLUCOSE 125* 11/29/2014   CHOL 138 11/29/2014   TRIG 58 11/29/2014   HDL 36* 11/29/2014   LDLCALC 90 11/29/2014   ALT 16 11/29/2014   AST 21 11/29/2014   NA 140 11/29/2014   K 4.2 11/29/2014   CL 103 11/29/2014   CREATININE 0.81 11/29/2014   BUN 17 11/29/2014   CO2 32 11/29/2014   TSH 2.176 11/29/2014   INR 2.8 01/01/2015     INR 2.8.  Assessment / Plan: 1. Atrial fibrillation, permanent. Rate is well controlled today. We will continue on his current dose of metoprolol and digoxin. Continue anticoagulation with Coumadin. INR is therapeutic. He does not need to take ASA.  2. History of dilated cardiomyopathy. In 2008 ejection fraction was 25-35%. Repeat echocardiogram in Jan 2015 showed EF of 50-55%. He will continue with metoprolol and ARB.   3. Chronic diastolic CHF. stable. Use lasix prn.  4. Hyperlipidemia. Continue simvastatin and fish oil.  5. Acute on chronic respiratory failure s/p PNA in February.  Improved and back to baseline. Follow up with pulmonary in April.

## 2015-01-12 NOTE — Patient Instructions (Signed)
Continue your current therapy  I will see you again in 6 months.   

## 2015-01-29 ENCOUNTER — Ambulatory Visit (INDEPENDENT_AMBULATORY_CARE_PROVIDER_SITE_OTHER): Payer: Medicare Other | Admitting: *Deleted

## 2015-01-29 DIAGNOSIS — I4891 Unspecified atrial fibrillation: Secondary | ICD-10-CM | POA: Diagnosis not present

## 2015-01-29 DIAGNOSIS — Z5181 Encounter for therapeutic drug level monitoring: Secondary | ICD-10-CM

## 2015-01-29 LAB — POCT INR: INR: 2

## 2015-02-14 ENCOUNTER — Encounter: Payer: Self-pay | Admitting: Adult Health

## 2015-02-14 ENCOUNTER — Ambulatory Visit (INDEPENDENT_AMBULATORY_CARE_PROVIDER_SITE_OTHER): Payer: Medicare Other | Admitting: Adult Health

## 2015-02-14 VITALS — BP 116/68 | HR 81 | Ht 68.0 in | Wt 127.0 lb

## 2015-02-14 DIAGNOSIS — J9612 Chronic respiratory failure with hypercapnia: Secondary | ICD-10-CM | POA: Diagnosis not present

## 2015-02-14 DIAGNOSIS — J449 Chronic obstructive pulmonary disease, unspecified: Secondary | ICD-10-CM | POA: Diagnosis not present

## 2015-02-14 NOTE — Assessment & Plan Note (Signed)
Compensated on oxygen Continue with 2 L with activity and at bedtime.

## 2015-02-14 NOTE — Patient Instructions (Signed)
Follow medication calendar closely and bring to each visit. Follow up Dr. Melvyn Novas  In 6 months and As needed   Please contact office for sooner follow up if symptoms do not improve or worsen or seek emergency care

## 2015-02-14 NOTE — Assessment & Plan Note (Signed)
Compensated on present regimen Patient's medications were reviewed today and patient education was given. Computerized medication calendar was adjusted/completed   Plan  Follow medication calendar closely and bring to each visit. Follow up Dr. Melvyn Novas  In 6 months and As needed   Please contact office for sooner follow up if symptoms do not improve or worsen or seek emergency care

## 2015-02-14 NOTE — Progress Notes (Signed)
Subjective:    Patient ID: Logan [name], male    DOB: 1924-12-09   MRN: [id number]    Brief patient profile:  57 yowm quit smoking 1992 referred 11/24/2013 by Dr Arelia Sneddon to pulmonary clinic for eval of new resp failure dx as GOLD IV with hypercapnia      History of Present Illness  Date of Admission: 10/30/2013 Date of Discharge: 11/04/13 Community-acquired-pneumonia Acute respiratory failure Afib w/ RVR HFpEF Sepsis HTN Hypothyroidism Constipation   Brief Hospital Course: He was admitted on 10/30/2013 for acute respiratory distress. His PMHx is significant for CHF with dilated cardiomyopathy, AFib HTN, HLD, Hypothyroidism, solitary pulmonary nodule and likely COPD by CXR. Flu was negative and he was treated for CAP with Vanc and Primaxin for 4 days due to hypoxia and new oxygen requirement. Cardiology was consulted to help manage his Afib (permanent) with RVR; Diltiazem drip was utilized, and switched back to Metoprolol with improvement. His antibiotics were narrowed to Levaquin once clinically improving. He was admitted with a new oxygen requirement, failed BiPAP due to worsening hypoxia, and treated with [place] O2 @ 5L which was unable to be weaned. Requiring 4L continuous at discharge .   CAP w/ Acute respiratory failure: Admitted to stepdown. Treated with Vanc and Primaxin x 4 days, then switched to Levaquin. New oxygen requirement; 4L Cardiac (Afib w/ RVR, HFpEF, Hypotension): Managed with diltiazem drip initially, and switched back to Metoprolol at decreased dose. ECHO showed EF 50-55%.   HTN: Lasix and Valsartan held for initial hypotension that resolved. Not restarted on d/c; BP well controlled @ 142/81 on discharge.   PT/OT: Established Home health. Given DME: shower chair, walker , portable O2    ECHO 11/02/13 Study Conclusions  - Left ventricle: The cavity size was normal. Wall thickness was normal. Systolic function was normal. The estimated ejection fraction was in the  range of 50% to 55%. Although no diagnostic regional wall motion abnormality was identified, this possibility cannot be completely excluded on the basis of this study. The study is not technically sufficient to allow evaluation of LV diastolic function. - Aortic valve: Mild regurgitation. - Right ventricle: The cavity size was mildly dilated. Wall thickness was normal. Systolic function was mildly reduced. - Right atrium: The atrium was moderately dilated. - Pulmonary arteries: Systolic pressure was moderately increased. PA peak pressure: 65mm Hg (S).     11/24/2013 post hosp initial office eval ov/Wert re: acute resp failure  / cor pulmonale   Prior to admit baseline = Doe x one year "when over do it" even going to mailbox and back sometimes   Not necessarily related to coughing. At Most could  do [employer], yard work= some weed eating and trimming  rec Stop all oil based vitamins and replace with powder if available Eat more fish - especially salmon  Please remember to go to the  x-ray department downstairs for your tests - we will call you with the results when they are available. Please schedule a follow up office visit in 4 weeks, sooner if needed - stay on same amount of oxygen in meantime  = 4lpm   12/21/2013 f/u ov/Wert re: new resp failure ? Etiology  Chief Complaint  Patient presents with  . Follow-up    Breathing is worse since last visit- esp in the am, has diff with getting dressed. Cough is some better. Sats 79%2lpm cont. at rest--increased to 74%4lpm cont.    no orthopnea but sob when stirring around, comfortable at  rest  Started on cozaar and lasix x 4 days prior to OV  By Dr Arelia Sneddon with progressive bilateral lower ext swelling but no better yet. Not wearing 02 as rec, just leaves on 2lpm due to small portable tank >>Increase your losartan to one whole and also lasix to one whole daily , 2 lasix first day x 1 , BNP decreased at 206.  O2 , 24/7, 4l/ rest, 6 l act     01/04/2014 Follow up and Med review  Patient returns for a followup and medication review. We reviewed all his medications and organized them into a medication calendar. Last visit. Patient was with increased lower shotty swelling. He was increased to Lasix to 40 mg daily, along with Cozaar 25 mg daily . Says that his lower extremity swelling has improved. Has noticed a slight improvement in shortness of breath. However, continues to get short of breath with minimal activity. He is down on oxygen 4 L at rest and 6 L with activity. Patient denies any hemoptysis, orthopnea, PND, or chest pain. Patient is a former smoker , Pulmonary  function test is pending Labs last visit showed BNP was decreased to 206. Sedimentation rate was normal  Chest x-ray showed no significant change in small to moderate bilateral pleural effusions with persistent multifocal pulmonary opacities in mid left lung.     02/07/2014 f/u ov/Wert re: chronic resp failure / GOLD III copd / cor pulmonale  Chief Complaint  Patient presents with  . Followup with PFT    Pt reports his breathing is doing well and denies any new co's today.   Not limited by breathing from desired activities  As long as on 02 2lpm / goal is to get off 02 completely rec Start Anoro take 2 puffs off one click each am Ok to leave off 02 at rest but wear it at 2lpm with acitivity and at bedtime Separating the top medications from the bottom group is fundamental to providing you adequate care going forward.      03/02/2014 f/u ov/Wert re:  GOLD III  ? aecopd  Chief Complaint  Patient presents with  . Acute Visit    Pt reports having increased cough and DOE for the past 10 days. He states that cough is occ prod with clear sputum.  He states that he was out of breath today walking from parking lot to door.     Not using action plan at bottom of calendar effectively. Does not have a saba Changed back to 02 24/7 at 3lpm  rec Prednisone 10 mg take  4  each am x 2 days,   2 each am x 2 days,  1 each am x 2 days and stop  Increase 02 to 4lpm for now As per calendar  Stop anoro Start spiriva 2 puffs each am Start Striverdi  2 puffs each am Only use your albuterol (proair)as a rescue medication Take extra furosemide 40 mg daily     03/06/2014 f/u ov/Wert re: chronic respiratory failure/ 4lpm 24/7  Chief Complaint  Patient presents with  . Follow-up    Pt states that his breathing is doing much better. No new co's today. Has not had to use rescue inhaler since the last visit.   still w/c bound but no resting sob, min cough s excess or purulent sputum >>extra lasix 40mg  daily    04/04/2014 Follow up and Med review  Pt returns for follow up and med review  Patient returns for a  followup and medication review. We reviewed all his medications and organized them into a medication calendar. It appears he has decreased his O2 to 2l/m and stopped Striverdi/Spiriva since when his samples ran out.  He says he is feeling much better w/ decreased dyspnea . No leg swelling. Less DOE.  O2 sats adequate on 2l/m at rest >90%  O2 sats walking drop <83% on 2lm walking , required 4lm with ambulation to stay above 88%.  No hemopytsis , chest pain, orthopnea, edema or fever.  rec Follow medication calendar closely and bring to each visit. Wear Oxygen 2l/m at rest and 4l/m with walking  Restart Spiriva Respimat 2 puff daily  Restart Striverdi Respimat 2 puffs daily   Changed to anoro by insurance 04/11/14   05/16/2014 f/u ov/Wert re: copd GOLD III pfts with hypercarbic resp failure => GOLD IV/ anoro only   Chief Complaint  Patient presents with  . Follow-up    Pt states that his breathing has improved. No new co's. Has not had to use rescue inhaler.   2lpm at [place] but not checking sats Not limited by breathing from desired activities as long as on 2lpm  rec 02 is 2lpm 24/7 except ok to take off at rest if sats over 90's   08/15/2014 f/u  ov/Wert re: GOLD III/ chronic hypercarbic resp failure  Chief Complaint  Patient presents with  . Follow-up    Pt states that his breathing is doing well. Has not used rescue inhaler since the last visit. No new co's today.    does have variable cough/ congestion with white mucus and is worse x sev days prior to OV  But has not used med calendar action plan to use either mucinex or saba as instructed. Feels anoro best rx he's tried and affordable on his plan. rec Prednisone 10 mg take  4 each am x 2 days,   2 each am x 2 days,  1 each am x 2 days and stop  See calendar for specific medication  Separating the top medications from the bottom group is fundamental to providing you adequate care going forward.   Use 02 at bedtime at 2lpm and when exerting but not at rest or just walking around the house    11/15/2014 f/u ov/Wert re: GOLD III/ chronic hypercarbic resp failure / on anoro and rarely saba prn  Chief Complaint  Patient presents with  . Follow-up    Pt states breathing unchanged, he has sl. cough with cleart to orange tinged mucus. Pt denies chest tightness/congestion and wheezing.   Not limited by breathing from desired activities  But very sedentary  >>no changes    02/14/2015 Follow up : GOLD III/ chronic hypercarbic resp failure  Patient returns for three-month follow-up. He remains on ANORO. He says overall he is doing well. He denies any flare of cough or wheezing, shortness of breath. Remains on oxygen at 2 L with activity and at bedtime. Pneumovax and Prevnar vaccines are up-to-date. He denies any hemoptysis, chest pain, orthopnea, PND, or increased leg swelling.   Current Medications, Allergies, Complete Past Medical History, Past Surgical History, Family History, and Social History were reviewed in [employer] record.  ROS  The following are not active complaints unless bolded sore throat, dysphagia, dental problems, itching, sneezing,  nasal  congestion or excess/ purulent secretions, ear ache,   fever, chills, sweats, unintended wt loss, pleuritic or exertional cp, hemoptysis,  orthopnea pnd or leg swelling, presyncope, palpitations,  heartburn, abdominal pain, anorexia, nausea, vomiting, diarrhea  or change in bowel or urinary habits, change in stools or urine, dysuria,hematuria,  rash, arthralgias, visual complaints, headache, numbness weakness or ataxia or problems with walking or coordination,  change in mood/affect or memory.                 Objective:   Physical Exam  amb wm nad  Elderly and thin   12/21/2013 143>141 01/04/2014  > 02/07/2014  135 > 03/02/2014 137 > 03/06/2014  135 >130 04/04/2014 >  05/16/2014 132 > 08/15/2014   135 > 11/15/2014  133 >127 02/14/2015     HEENT mild turbinate edema.  Oropharynx no thrush or excess pnd or cobblestoning.  No JVD or cervical adenopathy. Mild accessory muscle hypertrophy. Trachea midline, nl thryroid. Chest was hyperinflated by percussion with diminished breath sounds Regular rate and rhythm without murmur gallop or rub or increase P2 and No edema.  Abd: no hsm, nl excursion. Ext warm without cyanosis or clubbing.         Lab Results  Component Value Date   ESRSEDRATE 33* 03/06/2014   ESRSEDRATE 16 12/21/2013     Lab Results  Component Value Date   PROBNP 164.0* 03/06/2014     Recent Labs Lab 03/06/14 1226  NA 142  K 4.0  CL 97  CO2 36*  BUN 23  CREATININE 1.1  GLUCOSE 204*    Recent Labs Lab 03/06/14 1226  HGB 13.5  HCT 41.6  WBC 15.4*  PLT 398.0        CXR  05/16/2014 :   COPD/emphysema. Decreased pulmonary edema pattern.   Mild lung base opacities; favor atelectasis/scarring. Trace residual pleural effusions.       Assessment & Plan:   Outpatient Encounter Prescriptions as of 11/15/2014  Medication Sig  . aspirin 81 MG tablet Take 81 mg by mouth daily.  . Calcium-Magnesium-Zinc (CAL-MAG-ZINC PO) Take 1 tablet by mouth daily.   Marland Kitchen  dextromethorphan-guaiFENesin (MUCINEX DM) 30-600 MG per 12 hr tablet Take 1-2 tablets by mouth every 12 (twelve) hours as needed (cough, congestion).  Marland Kitchen digoxin (LANOXIN) 0.125 MG tablet Take 1 tablet (0.125 mg total) by mouth daily.  . furosemide (LASIX) 40 MG tablet Take 40 mg by mouth daily. May take 1 extra daily as needed for leg swelling  . levothyroxine (SYNTHROID, LEVOTHROID) 75 MCG tablet TAKE 1 TABLET (75 MCG TOTAL) BY MOUTH DAILY.  Marland Kitchen losartan (COZAAR) 25 MG tablet Take 25 mg by mouth daily.   . metoprolol tartrate (LOPRESSOR) 25 MG tablet Take 1 tablet (25 mg total) by mouth 2 (two) times daily.  . Multiple Vitamin (MULTIVITAMIN) tablet Take 1 tablet by mouth daily.   . predniSONE (DELTASONE) 10 MG tablet Take  4 each am x 2 days,   2 each am x 2 days,  1 each am x 2 days and stop  . PROAIR HFA 108 (90 BASE) MCG/ACT inhaler Inhale 2 puffs into the lungs every 4 (four) hours as needed.  . simvastatin (ZOCOR) 80 MG tablet Take 1 tablet (80 mg total) by mouth at bedtime.  . tamsulosin (FLOMAX) 0.4 MG CAPS capsule Take 0.4 mg by mouth daily after breakfast.   . Umeclidinium-Vilanterol 62.5-25 MCG/INH AEPB Inhale 1 puff into the lungs daily.  Marland Kitchen warfarin (COUMADIN) 3 MG tablet Take as directed per the coumadin clinic

## 2015-02-23 ENCOUNTER — Other Ambulatory Visit: Payer: Self-pay

## 2015-02-23 MED ORDER — LEVOTHYROXINE SODIUM 75 MCG PO TABS: 75.0000 ug | ORAL_TABLET | Freq: Every day | ORAL | Status: DC

## 2015-02-26 ENCOUNTER — Ambulatory Visit (INDEPENDENT_AMBULATORY_CARE_PROVIDER_SITE_OTHER): Payer: Medicare Other

## 2015-02-26 DIAGNOSIS — I4891 Unspecified atrial fibrillation: Secondary | ICD-10-CM

## 2015-02-26 DIAGNOSIS — Z5181 Encounter for therapeutic drug level monitoring: Secondary | ICD-10-CM

## 2015-02-26 LAB — POCT INR: INR: 1.7

## 2015-03-26 ENCOUNTER — Ambulatory Visit (INDEPENDENT_AMBULATORY_CARE_PROVIDER_SITE_OTHER): Payer: Medicare Other

## 2015-03-26 DIAGNOSIS — I4891 Unspecified atrial fibrillation: Secondary | ICD-10-CM

## 2015-03-26 DIAGNOSIS — Z5181 Encounter for therapeutic drug level monitoring: Secondary | ICD-10-CM | POA: Diagnosis not present

## 2015-03-26 LAB — POCT INR: INR: 2.1

## 2015-04-05 ENCOUNTER — Other Ambulatory Visit: Payer: Self-pay

## 2015-04-05 MED ORDER — SIMVASTATIN 80 MG PO TABS: 80.0000 mg | ORAL_TABLET | Freq: Every day | ORAL | Status: DC

## 2015-04-05 NOTE — Telephone Encounter (Signed)
Rx(s) sent to pharmacy electronically.  

## 2015-04-30 ENCOUNTER — Ambulatory Visit (INDEPENDENT_AMBULATORY_CARE_PROVIDER_SITE_OTHER): Payer: Medicare Other | Admitting: *Deleted

## 2015-04-30 DIAGNOSIS — Z5181 Encounter for therapeutic drug level monitoring: Secondary | ICD-10-CM | POA: Diagnosis not present

## 2015-04-30 DIAGNOSIS — I4891 Unspecified atrial fibrillation: Secondary | ICD-10-CM

## 2015-04-30 LAB — POCT INR: INR: 2.1

## 2015-05-23 IMAGING — CR DG CHEST 2V
2 series · 2 of 2 positions shown · non-contrast
Comparison: 03/02/2014

CLINICAL DATA: Bilateral pleural effusions, shortness of breath, ex
smoker.

EXAM:
CHEST  2 VIEW

[view not recorded (1 of 2)]
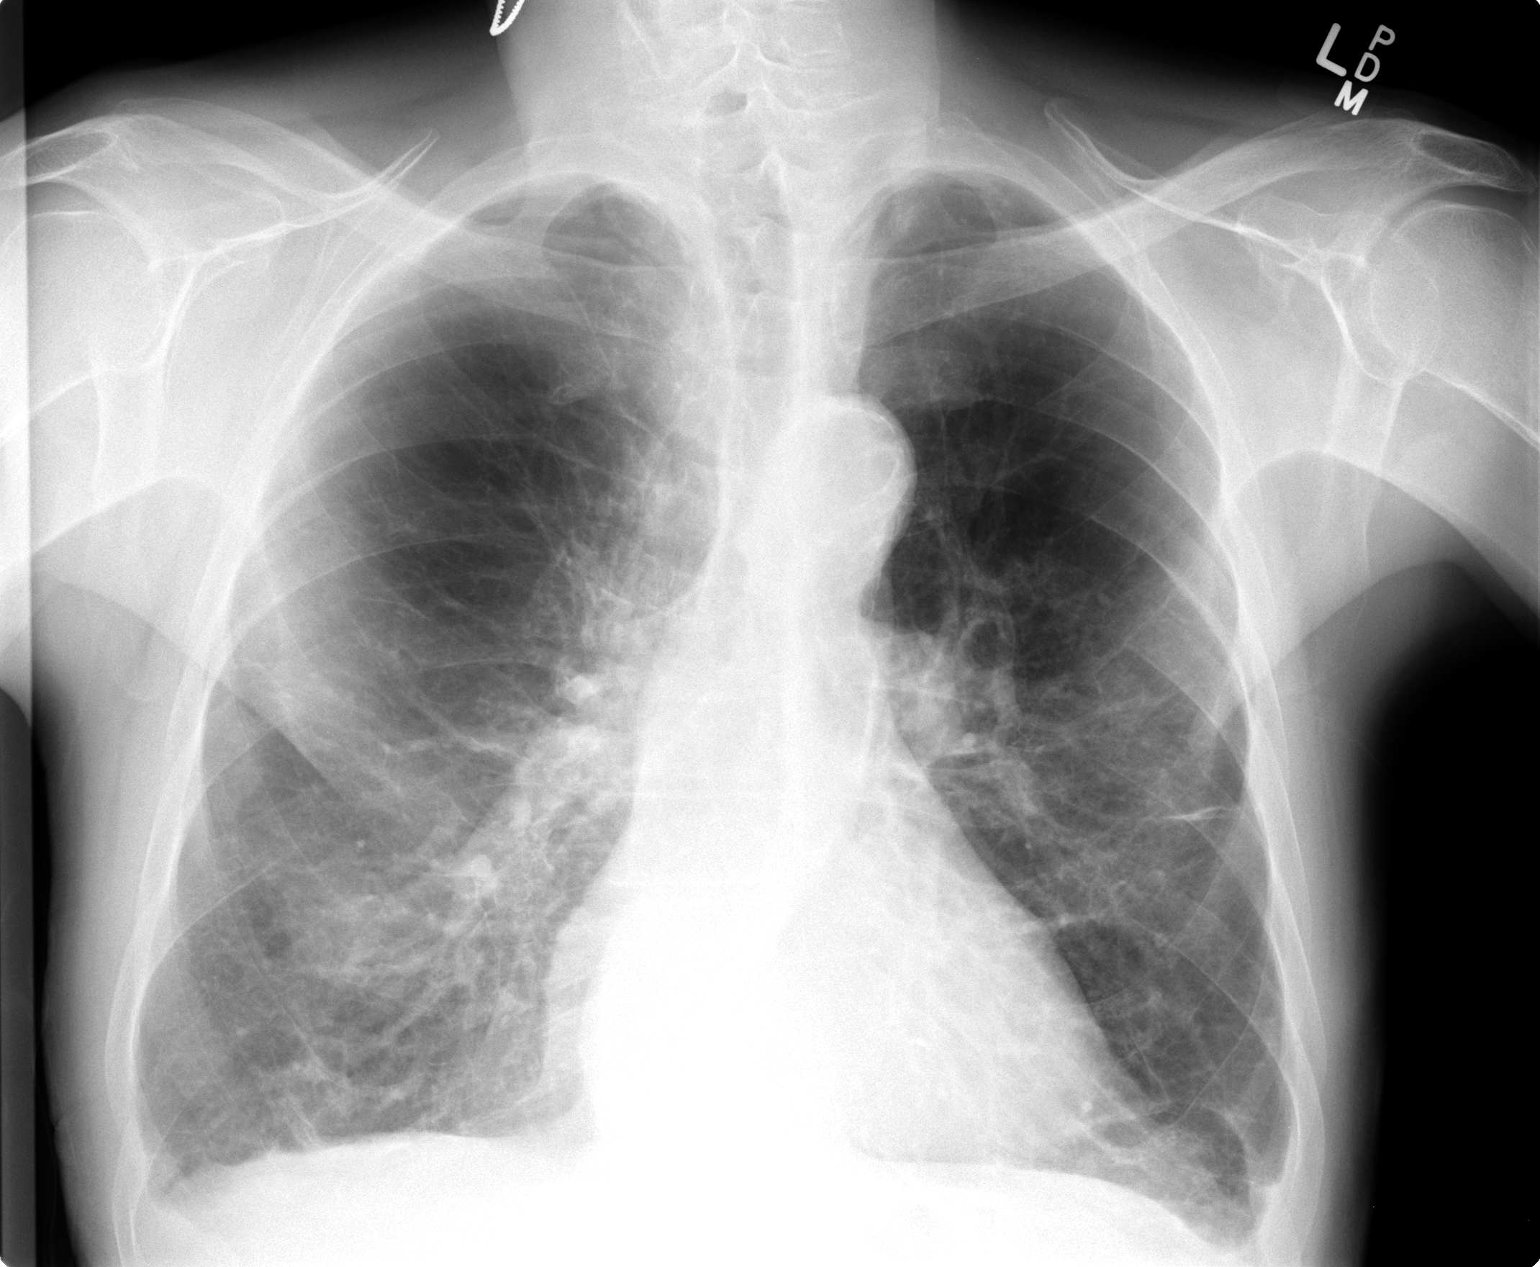

[view not recorded (2 of 2)]
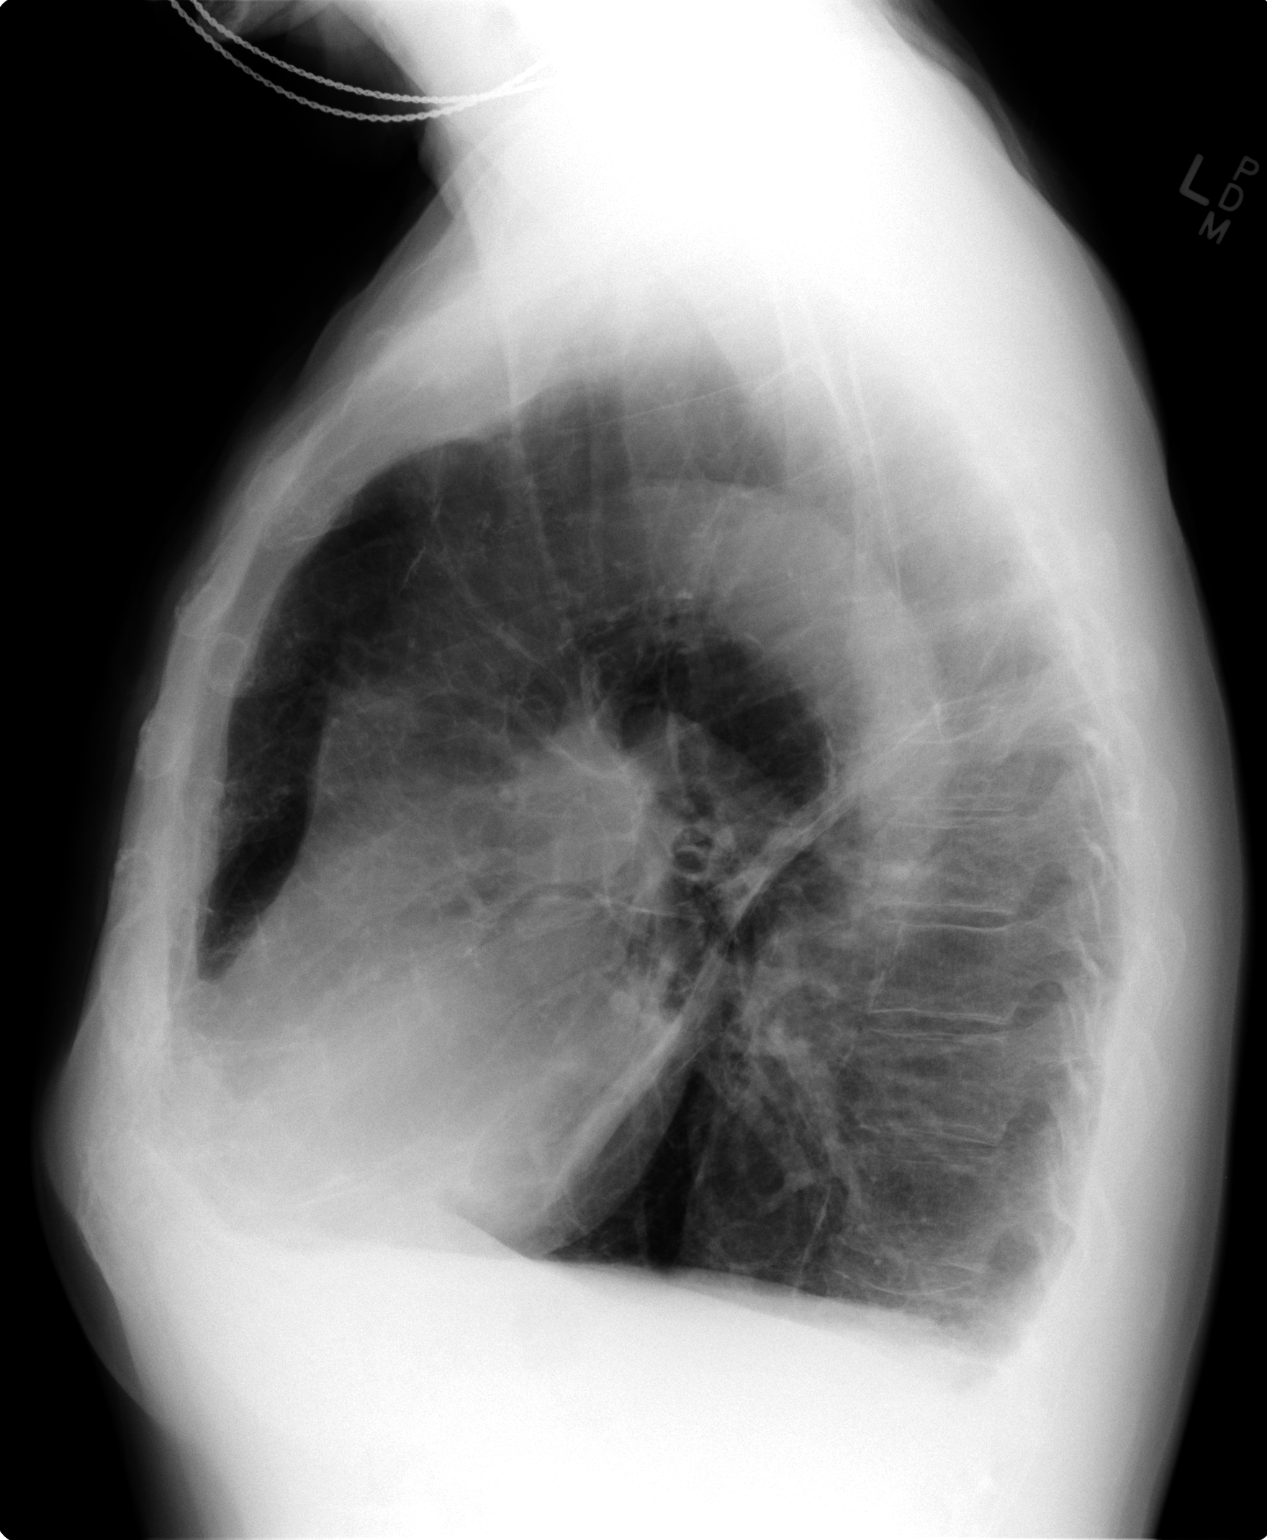

[2 of 2 positions shown; findings below may reference images not displayed]

FINDINGS: Aortic tortuosity and scattered atherosclerosis. Normal heart size.
Interstitial coarsening. Decreased perihilar opacities. Decreased
pleural effusions with trace residual right greater than left.
Emphysema. No definite pneumothorax. Osteopenia. Multilevel
degenerative change.
IMPRESSION: COPD/emphysema. Decreased pulmonary edema pattern. There may be mild
residual.

Mild lung base opacities; favor atelectasis/scarring.

Trace residual pleural effusions.

## 2015-05-30 ENCOUNTER — Other Ambulatory Visit: Payer: Self-pay | Admitting: Pharmacist Clinician (PhC)/ Clinical Pharmacy Specialist

## 2015-05-30 MED ORDER — WARFARIN SODIUM 3 MG PO TABS: ORAL_TABLET | ORAL | Status: DC

## 2015-06-11 ENCOUNTER — Ambulatory Visit (INDEPENDENT_AMBULATORY_CARE_PROVIDER_SITE_OTHER): Payer: Medicare Other

## 2015-06-11 DIAGNOSIS — I4891 Unspecified atrial fibrillation: Secondary | ICD-10-CM

## 2015-06-11 DIAGNOSIS — Z5181 Encounter for therapeutic drug level monitoring: Secondary | ICD-10-CM | POA: Diagnosis not present

## 2015-06-11 LAB — POCT INR: INR: 1.7

## 2015-06-19 ENCOUNTER — Telehealth: Payer: Self-pay | Admitting: Internal Medicine

## 2015-06-19 MED ORDER — UMECLIDINIUM-VILANTEROL 62.5-25 MCG/INH IN AEPB: 1.0000 | INHALATION_SPRAY | Freq: Every day | RESPIRATORY_TRACT | Status: DC

## 2015-06-19 NOTE — Telephone Encounter (Signed)
Spoke with pt.  Anoro rx sent to [facility].  Pt aware and voiced no further questions or cncerns at this time.

## 2015-07-03 ENCOUNTER — Encounter: Payer: Self-pay | Admitting: Cardiology

## 2015-07-03 ENCOUNTER — Ambulatory Visit (INDEPENDENT_AMBULATORY_CARE_PROVIDER_SITE_OTHER): Payer: Medicare Other | Admitting: Cardiology

## 2015-07-03 VITALS — BP 108/60 | HR 82 | Ht 67.0 in | Wt 127.5 lb

## 2015-07-03 DIAGNOSIS — Z7901 Long term (current) use of anticoagulants: Secondary | ICD-10-CM | POA: Diagnosis not present

## 2015-07-03 DIAGNOSIS — I5032 Chronic diastolic (congestive) heart failure: Secondary | ICD-10-CM | POA: Diagnosis not present

## 2015-07-03 DIAGNOSIS — J449 Chronic obstructive pulmonary disease, unspecified: Secondary | ICD-10-CM | POA: Diagnosis not present

## 2015-07-03 DIAGNOSIS — I482 Chronic atrial fibrillation, unspecified: Secondary | ICD-10-CM

## 2015-07-03 NOTE — Patient Instructions (Signed)
Continue your current therapy  I will see you in 6 months.   

## 2015-07-03 NOTE — Progress Notes (Signed)
Logan [name] Date of Birth: 1924/10/22   History of Present Illness: Logan [name] is seen for followup Afib and CHF. He has a history of permanent atrial fibrillation managed with rate control and anticoagulation. He also has chronic diastolic heart failure with last ejection fraction of 55-60%.  He was admitted to the hospital in February with PNA with sepsis and respiratory failure.   On follow up to he states his breathing is fair. He is on home oxygen which he wears at night and when he is out shopping.  He takes his lasix only when he has increased swelling.  No chest pain or swelling. Weight is down 0.5 lbs. He does have a chronic cough.  Current Outpatient Prescriptions on File Prior to Visit  Medication Sig Dispense Refill  . aspirin 81 MG tablet Take 81 mg by mouth daily.    . Calcium-Magnesium-Zinc (CAL-MAG-ZINC PO) Take 1 tablet by mouth daily.     Marland Kitchen dextromethorphan-guaiFENesin (MUCINEX DM) 30-600 MG per 12 hr tablet Take 1-2 tablets by mouth every 12 (twelve) hours as needed (cough, congestion).    Marland Kitchen digoxin (LANOXIN) 0.125 MG tablet Take 1 tablet (0.125 mg total) by mouth daily. 30 tablet 11  . furosemide (LASIX) 40 MG tablet Take 40 mg by mouth daily. May take 1 extra daily as needed for leg swelling    . levothyroxine (SYNTHROID, LEVOTHROID) 75 MCG tablet Take 1 tablet (75 mcg total) by mouth daily. 30 tablet 6  . losartan (COZAAR) 25 MG tablet Take 25 mg by mouth daily.     . Multiple Vitamin (MULTIVITAMIN) tablet Take 1 tablet by mouth daily.     Marland Kitchen PROAIR HFA 108 (90 BASE) MCG/ACT inhaler Inhale 2 puffs into the lungs every 4 (four) hours as needed for wheezing or shortness of breath.     . simvastatin (ZOCOR) 80 MG tablet Take 1 tablet (80 mg total) by mouth at bedtime. 30 tablet 9  . tamsulosin (FLOMAX) 0.4 MG CAPS capsule Take 0.4 mg by mouth daily after breakfast.     . Umeclidinium-Vilanterol 62.5-25 MCG/INH AEPB Inhale 1 puff into the lungs daily. 60 each 1  .  warfarin (COUMADIN) 3 MG tablet Take 1 to 1.5 tablets by mouth daily as directed by coumadin clinic 135 tablet 1   No current facility-administered medications on file prior to visit.    No Known Allergies  Past Medical History  Diagnosis Date  . Chronic atrial fibrillation   . Dilated cardiomyopathy   . PVC's (premature ventricular contractions)   . Hyperlipidemia   . COPD (chronic obstructive pulmonary disease)   . Hypothyroidism   . Renal calculi   . Chronic anticoagulation   . CHF (congestive heart failure)     DUE TO SYSTOLIC DYSFUNCTION  . Shortness of breath     exertional  . Cancer     vocal cord - radiation     Past Surgical History  Procedure Laterality Date  . Cardiac catheterization  02/26/2007    EF 35-40%  . Appendectomy    . Tonsillectomy and adenoidectomy    . US echocardiography  07/08/2010    EF 50-55%  . US echocardiography  02/16/2007    EF 25-35%  . Transthoracic echocardiogram  08/26/2001    EF 25-30%  . Cardiovascular stress test  02/18/2007    EF 40%  . Microlaryngoscopy with co2 laser and excision of vocal cord lesion      15 years ago  . Lithotripsy    .  Inguinal hernia repair Right 12/28/2012    Procedure: HERNIA REPAIR INGUINAL ADULT;  Surgeon: Joyice Faster. Cornett, MD;  Location: [facility];  Service: General;  Laterality: Right;    History  Smoking status  . Former Smoker -- 1.00 packs/day for 35 years  . Types: Cigarettes  . Quit date: 07/11/1961  Smokeless tobacco  . Not on file    History  Alcohol Use No    Family History  Problem Relation Age of Onset  . Stroke Mother   . Seizures Mother   . Heart attack Father   . Heart failure Father   . Heart attack Brother     Review of Systems: As per history of present illness.  All other systems were reviewed and are negative.  Physical Exam: BP 108/60 mmHg  Pulse 82  Ht 5\' 7"  (1.702 m)  Wt 57.834 kg (127 lb 8 oz)  BMI 19.96 kg/m2 He is a pleasant white male in no acute distress.  The HEENT exam is normal. The carotids are 2+ without bruits.  There is no thyromegaly.  There is no JVD.  The lungs reveal diminished BS with rhonchi. No rales.  The heart exam reveals an irregular rate with a normal S1 and S2.  There are no murmurs, gallops, or rubs.  The PMI is not displaced.   Abdominal exam reveals good bowel sounds.  There is no hepatosplenomegaly or tenderness.    Exam of the legs no edema.  Pedal pulses are intact.  Cranial nerves II - XII are intact.  Motor and sensory functions are intact.  The gait is normal.  LABORATORY DATA: Lab Results  Component Value Date   WBC 9.9 11/29/2014   HGB 11.2* 11/29/2014   HCT 34.6* 11/29/2014   PLT 307 11/29/2014   GLUCOSE 125* 11/29/2014   CHOL 138 11/29/2014   TRIG 58 11/29/2014   HDL 36* 11/29/2014   LDLCALC 90 11/29/2014   ALT 16 11/29/2014   AST 21 11/29/2014   NA 140 11/29/2014   K 4.2 11/29/2014   CL 103 11/29/2014   CREATININE 0.81 11/29/2014   BUN 17 11/29/2014   CO2 32 11/29/2014   TSH 2.176 11/29/2014   INR 1.7 06/11/2015     INR last 1.7  Assessment / Plan: 1. Atrial fibrillation, permanent. Rate is well controlled today. We will continue on his current dose of metoprolol and digoxin. Continue anticoagulation with Coumadin. INR is adjusted by pharmacy.   2. History of dilated cardiomyopathy. In 2008 ejection fraction was 25-35%. Repeat echocardiogram in Jan 2015 showed EF of 50-55%. He will continue with metoprolol and ARB.   3. Chronic diastolic CHF. stable. Use lasix prn.  4. Hyperlipidemia. Continue simvastatin and fish oil.  5. Chronic respiratory failure s/p PNA in February.  Improved and back to baseline. Follow up with pulmonary- Dr. Melvyn Novas.  I will follow up in 6 months.

## 2015-07-09 ENCOUNTER — Ambulatory Visit (INDEPENDENT_AMBULATORY_CARE_PROVIDER_SITE_OTHER): Payer: Medicare Other | Admitting: Pharmacist

## 2015-07-09 DIAGNOSIS — I482 Chronic atrial fibrillation, unspecified: Secondary | ICD-10-CM

## 2015-07-09 DIAGNOSIS — I4891 Unspecified atrial fibrillation: Secondary | ICD-10-CM | POA: Diagnosis not present

## 2015-07-09 DIAGNOSIS — Z5181 Encounter for therapeutic drug level monitoring: Secondary | ICD-10-CM | POA: Diagnosis not present

## 2015-07-09 LAB — POCT INR: INR: 2.3

## 2015-08-06 ENCOUNTER — Ambulatory Visit (INDEPENDENT_AMBULATORY_CARE_PROVIDER_SITE_OTHER): Payer: Medicare Other | Admitting: *Deleted

## 2015-08-06 DIAGNOSIS — I482 Chronic atrial fibrillation, unspecified: Secondary | ICD-10-CM

## 2015-08-06 DIAGNOSIS — Z5181 Encounter for therapeutic drug level monitoring: Secondary | ICD-10-CM

## 2015-08-06 DIAGNOSIS — I4891 Unspecified atrial fibrillation: Secondary | ICD-10-CM

## 2015-08-06 LAB — POCT INR: INR: 2

## 2015-08-13 ENCOUNTER — Ambulatory Visit (INDEPENDENT_AMBULATORY_CARE_PROVIDER_SITE_OTHER): Payer: Medicare Other | Admitting: Internal Medicine

## 2015-08-13 ENCOUNTER — Encounter: Payer: Self-pay | Admitting: Internal Medicine

## 2015-08-13 VITALS — BP 108/80 | HR 80 | Ht 67.0 in | Wt 126.0 lb

## 2015-08-13 DIAGNOSIS — J449 Chronic obstructive pulmonary disease, unspecified: Secondary | ICD-10-CM | POA: Diagnosis not present

## 2015-08-13 DIAGNOSIS — J9612 Chronic respiratory failure with hypercapnia: Secondary | ICD-10-CM | POA: Diagnosis not present

## 2015-08-13 NOTE — Progress Notes (Signed)
Subjective:    Patient ID: Logan [name], male    DOB: 08/09/1925   MRN: [id number]    Brief patient profile:  50 yowm quit smoking 1992 referred 11/24/2013 by Dr Arelia Sneddon to pulmonary clinic for eval of new resp failure dx as GOLD IV with hypercapnia      History of Present Illness  Date of Admission: 10/30/2013 Date of Discharge: 11/04/13 Community-acquired-pneumonia Acute respiratory failure Afib w/ RVR HFpEF Sepsis HTN Hypothyroidism Constipation   Brief Hospital Course: He was admitted on 10/30/2013 for acute respiratory distress. His PMHx is significant for CHF with dilated cardiomyopathy, AFib HTN, HLD, Hypothyroidism, solitary pulmonary nodule and likely COPD by CXR. Flu was negative and he was treated for CAP with Vanc and Primaxin for 4 days due to hypoxia and new oxygen requirement. Cardiology was consulted to help manage his Afib (permanent) with RVR; Diltiazem drip was utilized, and switched back to Metoprolol with improvement. His antibiotics were narrowed to Levaquin once clinically improving. He was admitted with a new oxygen requirement, failed BiPAP due to worsening hypoxia, and treated with Petrey O2 @ 5L which was unable to be weaned. Requiring 4L continuous at discharge .   CAP w/ Acute respiratory failure: Admitted to stepdown. Treated with Vanc and Primaxin x 4 days, then switched to Levaquin. New oxygen requirement; 4L Cardiac (Afib w/ RVR, HFpEF, Hypotension): Managed with diltiazem drip initially, and switched back to Metoprolol at decreased dose. ECHO showed EF 50-55%.   HTN: Lasix and Valsartan held for initial hypotension that resolved. Not restarted on d/c; BP well controlled @ 142/81 on discharge.   PT/OT: Established Home health. Given DME: shower chair, walker , portable O2    ECHO 11/02/13 Study Conclusions  - Left ventricle: The cavity size was normal. Wall thickness was normal. Systolic function was normal. The estimated ejection fraction was in the  range of 50% to 55%. Although no diagnostic regional wall motion abnormality was identified, this possibility cannot be completely excluded on the basis of this study. The study is not technically sufficient to allow evaluation of LV diastolic function. - Aortic valve: Mild regurgitation. - Right ventricle: The cavity size was mildly dilated. Wall thickness was normal. Systolic function was mildly reduced. - Right atrium: The atrium was moderately dilated. - Pulmonary arteries: Systolic pressure was moderately increased. PA peak pressure: 20mm Hg (S).           02/07/2014 f/u ov/Wert re: chronic resp failure / GOLD III copd / cor pulmonale  Chief Complaint  Patient presents with  . Followup with PFT    Pt reports his breathing is doing well and denies any new co's today.   Not limited by breathing from desired activities  As long as on 02 2lpm / goal is to get off 02 completely rec Start Anoro take 2 puffs off one click each am Ok to leave off 02 at rest but wear it at 2lpm with acitivity and at bedtime Separating the top medications from the bottom group is fundamental to providing you adequate care going forward.      03/02/2014 f/u ov/Wert re:  GOLD III  ? aecopd  Chief Complaint  Patient presents with  . Acute Visit    Pt reports having increased cough and DOE for the past 10 days. He states that cough is occ prod with clear sputum.  He states that he was out of breath today walking from parking lot to door.     Not using  action plan at bottom of calendar effectively. Does not have a saba Changed back to 02 24/7 at 3lpm  rec Prednisone 10 mg take  4 each am x 2 days,   2 each am x 2 days,  1 each am x 2 days and stop  Increase 02 to 4lpm for now As per calendar  Stop anoro Start spiriva 2 puffs each am Start Striverdi  2 puffs each am Only use your albuterol (proair)as a rescue medication Take extra furosemide 40 mg daily     03/06/2014 f/u ov/Wert re: chronic  respiratory failure/ 4lpm 24/7  Chief Complaint  Patient presents with  . Follow-up    Pt states that his breathing is doing much better. No new co's today. Has not had to use rescue inhaler since the last visit.   still w/c bound but no resting sob, min cough s excess or purulent sputum >>extra lasix 40mg  daily    04/04/2014 Follow up and Med review  Pt returns for follow up and med review  Patient returns for a followup and medication review. We reviewed all his medications and organized them into a medication calendar. It appears he has decreased his O2 to 2l/m and stopped Striverdi/Spiriva since when his samples ran out.  He says he is feeling much better w/ decreased dyspnea . No leg swelling. Less DOE.  O2 sats adequate on 2l/m at rest >90%  O2 sats walking drop <83% on 2lm walking , required 4lm with ambulation to stay above 88%.  No hemopytsis , chest pain, orthopnea, edema or fever.  rec Follow medication calendar closely and bring to each visit. Wear Oxygen 2l/m at rest and 4l/m with walking  Restart Spiriva Respimat 2 puff daily  Restart Striverdi Respimat 2 puffs daily   Changed to anoro by insurance 04/11/14       02/14/2015 Follow up : GOLD III/ chronic hypercarbic resp failure  Patient returns for three-month follow-up. He remains on ANORO. He says overall he is doing well. He denies any flare of cough or wheezing, shortness of breath. Remains on oxygen at 2 L with activity and at bedtime. Pneumovax and Prevnar vaccines are up-to-date. He denies any hemoptysis, chest pain, orthopnea, PND, or increased leg swelling. rec Follow medication calendar closely and bring to each visit.   08/13/2015  f/u ov/Wert re: copd III on anoro and 02 2lpm at hs and prn daytime  Chief Complaint  Patient presents with  . Follow-up    Pt states his breathing overall doing well. He has not used rescue inhaler. Cough is unchanged.   doe = MMRC3 = can't walk 100 yards even at a slow  pace at a flat grade s stopping due to sob   Has med calendar not really following action plan for cough/ reviewed. No purulent or bloody sputum  No obvious day to day or daytime variability or assoc  cp or chest tightness, subjective wheeze or overt sinus or hb symptoms. No unusual exp hx or h/o childhood pna/ asthma or knowledge of premature birth.  Sleeping ok without nocturnal  or early am exacerbation  of respiratory  c/o's or need for noct saba. Also denies any obvious fluctuation of symptoms with weather or environmental changes or other aggravating or alleviating factors except as outlined above   Current Medications, Allergies, Complete Past Medical History, Past Surgical History, Family History, and Social History were reviewed in [employer] record.  ROS  The following are not active  complaints unless bolded sore throat, dysphagia, dental problems, itching, sneezing,  nasal congestion or excess/ purulent secretions, ear ache,   fever, chills, sweats, unintended wt loss, classically pleuritic or exertional cp, hemoptysis,  orthopnea pnd or leg swelling, presyncope, palpitations, abdominal pain, anorexia, nausea, vomiting, diarrhea  or change in bowel or bladder habits, change in stools or urine, dysuria,hematuria,  rash, arthralgias, visual complaints, headache, numbness, weakness or ataxia or problems with walking or coordination,  change in mood/affect or memory.                 Objective:   Physical Exam  amb wm nad  Elderly and thin with rattling cough on FVC   12/21/2013 143>141 01/04/2014  > 02/07/2014  135 > 03/02/2014 137 > 03/06/2014  135 >130 04/04/2014 >  05/16/2014 132 > 08/15/2014   135 > 11/15/2014  133 >127 02/14/2015 > 08/13/2015  126     HEENT mild turbinate edema.  Oropharynx no thrush or excess pnd or cobblestoning.  No JVD or cervical adenopathy. Mild accessory muscle hypertrophy. Trachea midline, nl thryroid. Chest was hyperinflated by percussion  with diminished breath sounds and insp / exp rhonchi bilaterally. Regular rate and rhythm without murmur gallop or rub or increase P2 and No edema.  Abd: no hsm, nl excursion. Ext warm without cyanosis or clubbing.                    Assessment & Plan:

## 2015-08-13 NOTE — Patient Instructions (Addendum)
See calendar for specific medication instructions and bring it back for each and every office visit for every healthcare provider you see.  Without it,  you may not receive the best quality medical care that we feel you deserve.  You will note that the calendar groups together  your maintenance  medications that are timed at particular times of the day.  Think of this as your checklist for what your doctor has instructed you to do until your next evaluation to see what benefit  there is  to staying on a consistent group of medications intended to keep you well.  The other group at the bottom is entirely up to you to use as you see fit  for specific symptoms that may arise between visits that require you to treat them on an as needed basis.  Think of this as your action plan or "what if" list.   Separating the top medications from the bottom group is fundamental to providing you adequate care going forward.   For cough / congestion follow the action plan at bottom of your med calendar >>  Flutter valve/mucinex dm as listed   Please schedule a follow up visit in 6 months but call sooner if needed

## 2015-08-17 ENCOUNTER — Encounter: Payer: Self-pay | Admitting: Internal Medicine

## 2015-08-17 NOTE — Assessment & Plan Note (Signed)
-   PFT's 02/07/14  FEV1  0.69 (31%) ratio 41 no change p B2 and DLCO 30% - Trial of anoro 02/07/2014 > no better - trial of spiriva/ striverdi 03/02/2014 > improved 03/06/2014 > changed to anoro per formulary 05/16/2014 > improved 08/15/2014  - 08/15/2014 p extensive coaching HFA effectiveness =    75%  -med calendar 02/14/2015   Adequate control on present rx, reviewed > no change in rx needed    I had an extended discussion with the patient and wife reviewing all relevant studies completed to date and  lasting 15 to 20 minutes of a 25 minute visit    Each maintenance medication was reviewed in detail including most importantly the difference between maintenance and prns and under what circumstances the prns are to be triggered using an action plan format that is not reflected in the computer generated alphabetically organized AVS but trather by a customized med calendar that reflects the AVS meds with confirmed 100% correlation.   Please see instructions for details which were reviewed in writing and the patient given a copy highlighting the part that I personally wrote and discussed at today's ov.

## 2015-08-17 NOTE — Assessment & Plan Note (Signed)
See admit 10/30/13 > d/c'd on 4lpm 24/7  - as of 12/21/2013 rec 4lpm 247 except fo 6 lpm with activity  - 02/07/2014 93% RA so rec 02 2lpm at hs and with activity - 03/02/2014 2 lpm 84% > corrected on 4lpm - 03/06/2014 HC03 36 c/w chronic hypercarbia as well - 05/16/2014  Walked 2lpm  x 3 laps @ 185 ft each stopped due to end of study moderate pace no desat - 08/15/2014   Walked RA x one lap @ 185 stopped due to sat 90% and nl pace  - 11/15/2014  Walked 2lpm x 3 laps @ 185 ft each stopped due to  End of study, erratic sats but with head probe did not desat on last one  rx = 2lpm with sleep and exertion ok on RA at rest and around the house

## 2015-09-03 ENCOUNTER — Ambulatory Visit (INDEPENDENT_AMBULATORY_CARE_PROVIDER_SITE_OTHER): Payer: Medicare Other | Admitting: Pharmacist

## 2015-09-03 DIAGNOSIS — I4891 Unspecified atrial fibrillation: Secondary | ICD-10-CM

## 2015-09-03 DIAGNOSIS — I482 Chronic atrial fibrillation, unspecified: Secondary | ICD-10-CM

## 2015-09-03 DIAGNOSIS — Z5181 Encounter for therapeutic drug level monitoring: Secondary | ICD-10-CM

## 2015-09-03 LAB — POCT INR: INR: 2.9

## 2015-09-05 ENCOUNTER — Other Ambulatory Visit: Payer: Self-pay | Admitting: *Deleted

## 2015-09-05 MED ORDER — DIGOXIN 125 MCG PO TABS: 0.1250 mg | ORAL_TABLET | Freq: Every day | ORAL | Status: DC

## 2015-09-18 ENCOUNTER — Other Ambulatory Visit: Payer: Self-pay | Admitting: Internal Medicine

## 2015-09-25 ENCOUNTER — Other Ambulatory Visit: Payer: Self-pay

## 2015-09-27 MED ORDER — LEVOTHYROXINE SODIUM 75 MCG PO TABS: 75.0000 ug | ORAL_TABLET | Freq: Every day | ORAL | Status: DC

## 2015-09-27 NOTE — Telephone Encounter (Signed)
Pt called back and he said he needs his medicine today.

## 2015-09-27 NOTE — Telephone Encounter (Signed)
°*  STAT* If patient is at the pharmacy, call can be transferred to refill team.   1. Which medications need to be refilled? (please list name of each medication and dose if known) Levothyroxine-please call in this morning if possible  2. Which pharmacy/location (including street and city if local pharmacy) is medication to be sent to?[phone]  3. Do they need a 30 day or 90 day supply? 30 and refills

## 2015-10-01 ENCOUNTER — Other Ambulatory Visit: Payer: Self-pay | Admitting: *Deleted

## 2015-10-01 MED ORDER — LEVOTHYROXINE SODIUM 75 MCG PO TABS: 75.0000 ug | ORAL_TABLET | Freq: Every day | ORAL | Status: DC

## 2015-10-16 ENCOUNTER — Ambulatory Visit (INDEPENDENT_AMBULATORY_CARE_PROVIDER_SITE_OTHER): Payer: Medicare Other | Admitting: *Deleted

## 2015-10-16 DIAGNOSIS — I4891 Unspecified atrial fibrillation: Secondary | ICD-10-CM

## 2015-10-16 DIAGNOSIS — Z5181 Encounter for therapeutic drug level monitoring: Secondary | ICD-10-CM

## 2015-10-16 DIAGNOSIS — I482 Chronic atrial fibrillation, unspecified: Secondary | ICD-10-CM

## 2015-10-16 LAB — POCT INR: INR: 2.2

## 2015-11-26 ENCOUNTER — Ambulatory Visit (INDEPENDENT_AMBULATORY_CARE_PROVIDER_SITE_OTHER): Payer: Medicare Other

## 2015-11-26 DIAGNOSIS — Z5181 Encounter for therapeutic drug level monitoring: Secondary | ICD-10-CM

## 2015-11-26 DIAGNOSIS — I482 Chronic atrial fibrillation, unspecified: Secondary | ICD-10-CM

## 2015-11-26 DIAGNOSIS — I4891 Unspecified atrial fibrillation: Secondary | ICD-10-CM | POA: Diagnosis not present

## 2015-11-26 LAB — POCT INR: INR: 2.5

## 2015-12-04 IMAGING — CR DG CHEST 1V PORT
1 series · 1 of 1 positions shown · non-contrast
Comparison: 11/24/2014.

CLINICAL DATA: Pneumonia.

EXAM:
PORTABLE CHEST - 1 VIEW

[AP]
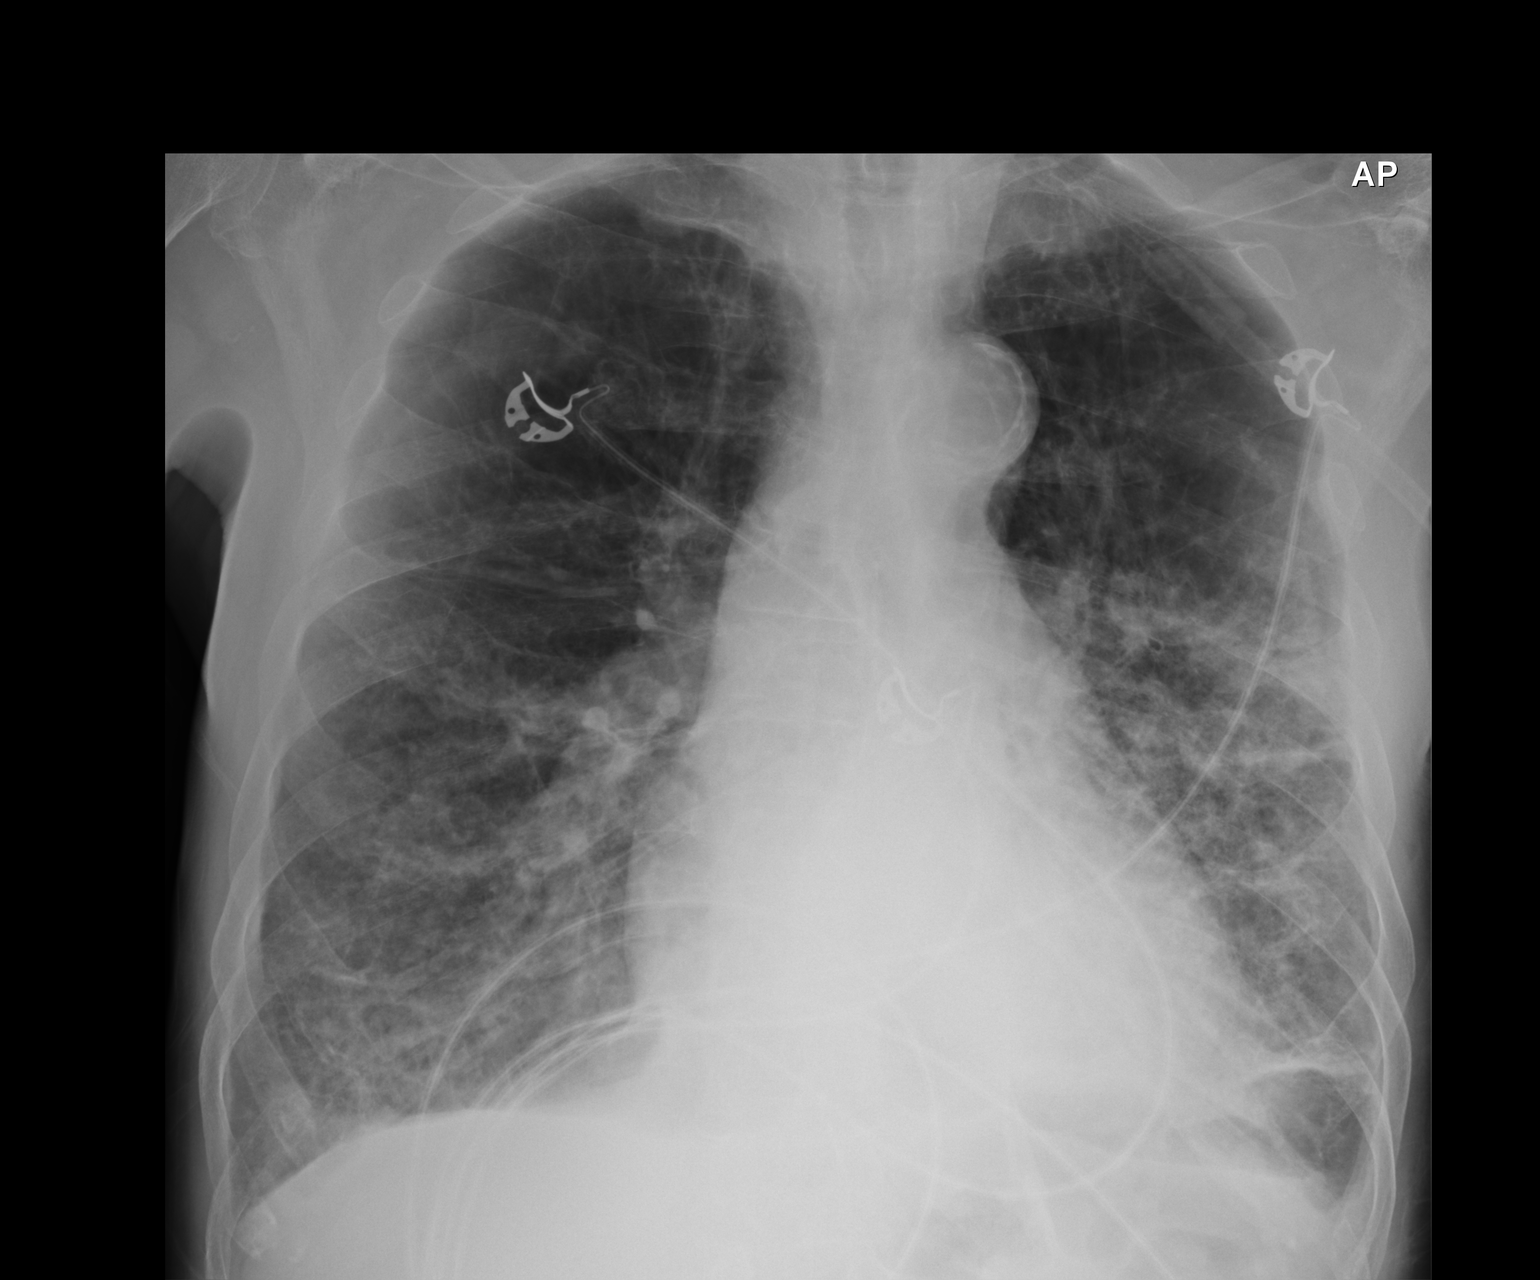

[1 of 1 positions shown; findings below may reference images not displayed]

FINDINGS: Mediastinum hilar structures are normal. Persistent bilateral
pulmonary alveolar infiltrates again noted, no change. Persistent
cardiomegaly. Small left pleural effusion. No pneumothorax.
IMPRESSION: 1. Persistent bilateral pulmonary infiltrates without change. These
patch that could be secondary to pulmonary edema and/or pneumonia.
No interim change.
2. Persistent cardiomegaly. No prominent pulmonary venous congestion
.

## 2015-12-06 IMAGING — CR DG CHEST 1V PORT
1 series · 1 of 1 positions shown · non-contrast
Comparison: 12/07/2014

CLINICAL DATA: Followup of pneumonia.

EXAM:
PORTABLE CHEST - 1 VIEW

[AP]
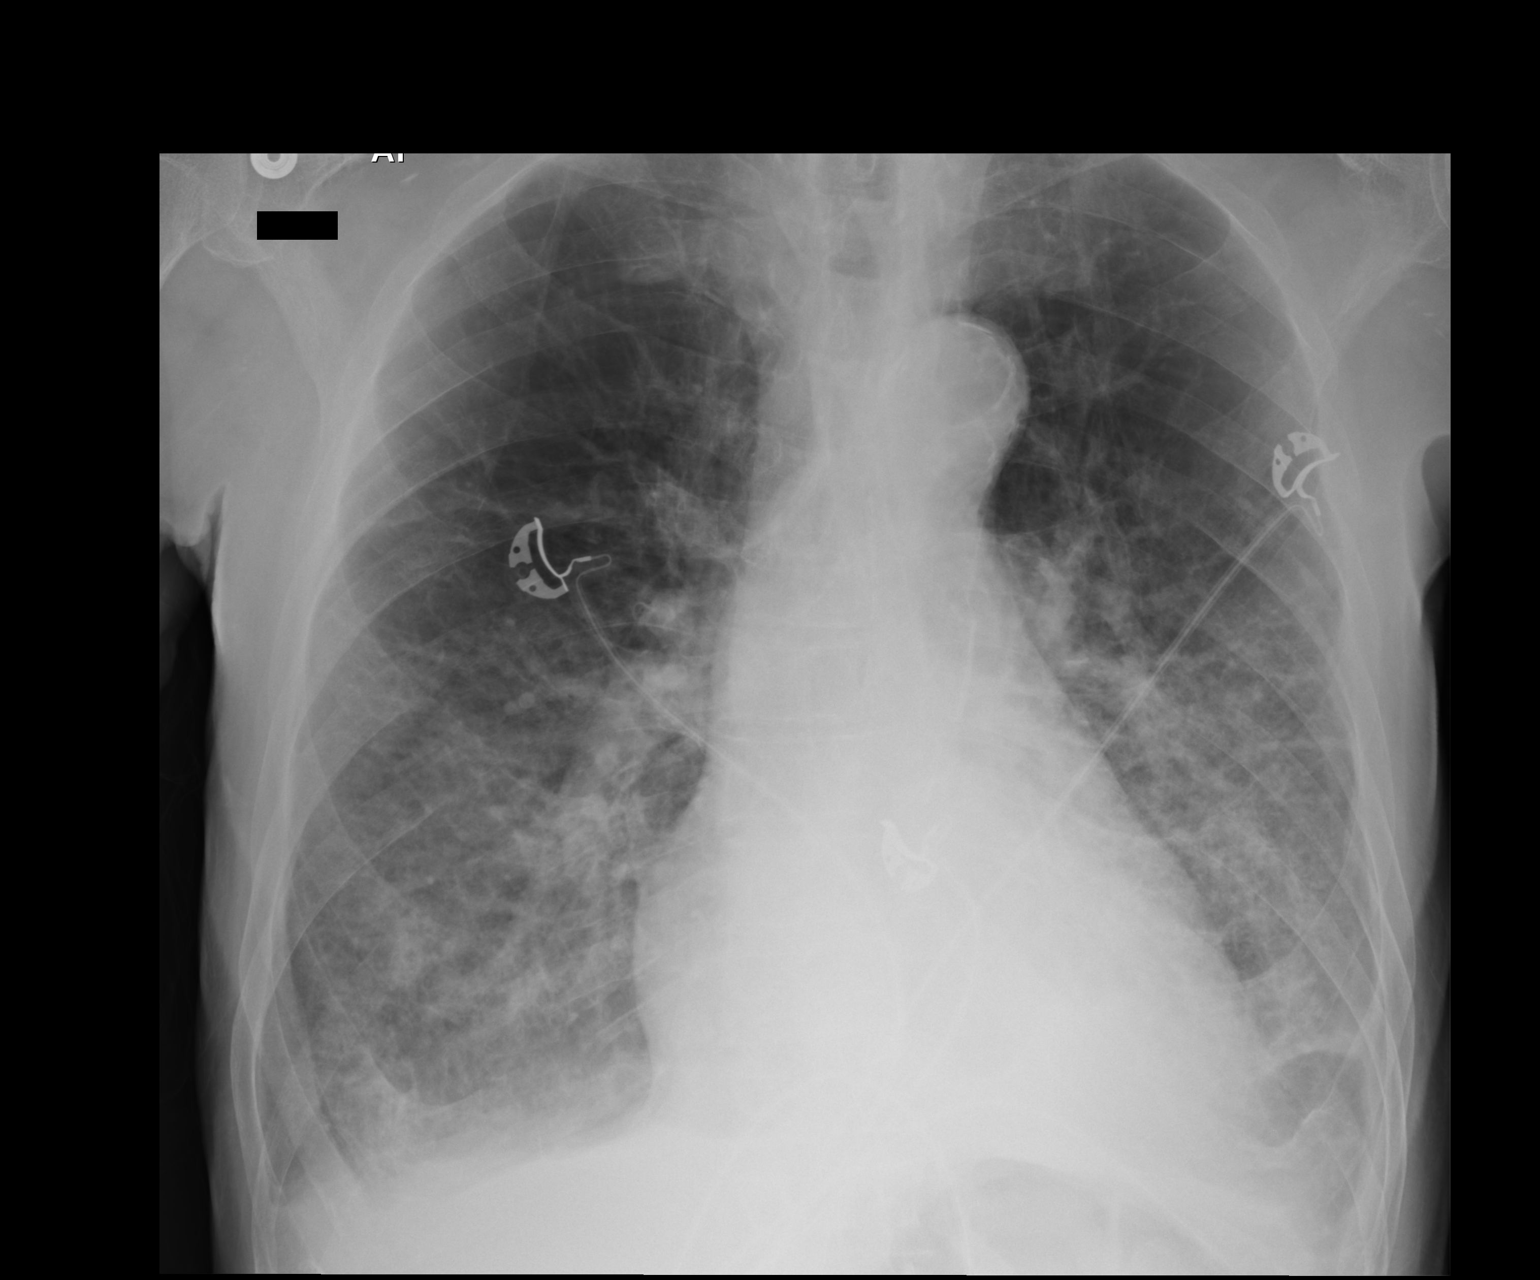

[1 of 1 positions shown; findings below may reference images not displayed]

FINDINGS: Hyperinflation. Numerous leads and wires project over the chest.
Midline trachea. Mild cardiomegaly with transverse aortic
atherosclerosis. Probable small bilateral pleural effusions. No
pneumothorax. Interstitial edema is moderate. Lower lobe predominant
airspace opacities are not significantly changed.
IMPRESSION: No significant change since the prior exam.

Congestive heart failure with small bilateral pleural effusions.

Bibasilar airspace disease which could represent atelectasis or
concurrent infection.

## 2015-12-10 ENCOUNTER — Other Ambulatory Visit: Payer: Self-pay

## 2015-12-10 MED ORDER — WARFARIN SODIUM 3 MG PO TABS: ORAL_TABLET | ORAL | Status: DC

## 2015-12-21 ENCOUNTER — Inpatient Hospital Stay (HOSPITAL_COMMUNITY)
Admission: EM | Admit: 2015-12-21 | Discharge: 2015-12-24 | DRG: 190 | Disposition: A | Discharge status: Home / Self Care | Payer: Medicare Other | Attending: Infectious Disease | Admitting: Infectious Disease

## 2015-12-21 ENCOUNTER — Emergency Department (HOSPITAL_COMMUNITY): Payer: Medicare Other

## 2015-12-21 ENCOUNTER — Encounter (HOSPITAL_COMMUNITY): Payer: Self-pay

## 2015-12-21 DIAGNOSIS — R911 Solitary pulmonary nodule: Secondary | ICD-10-CM | POA: Diagnosis present

## 2015-12-21 DIAGNOSIS — J441 Chronic obstructive pulmonary disease with (acute) exacerbation: Secondary | ICD-10-CM

## 2015-12-21 DIAGNOSIS — N179 Acute kidney failure, unspecified: Secondary | ICD-10-CM | POA: Diagnosis present

## 2015-12-21 DIAGNOSIS — I420 Dilated cardiomyopathy: Secondary | ICD-10-CM | POA: Diagnosis present

## 2015-12-21 DIAGNOSIS — Z87891 Personal history of nicotine dependence: Secondary | ICD-10-CM

## 2015-12-21 DIAGNOSIS — I493 Ventricular premature depolarization: Secondary | ICD-10-CM

## 2015-12-21 DIAGNOSIS — J189 Pneumonia, unspecified organism: Secondary | ICD-10-CM

## 2015-12-21 DIAGNOSIS — I482 Chronic atrial fibrillation, unspecified: Secondary | ICD-10-CM

## 2015-12-21 DIAGNOSIS — R0600 Dyspnea, unspecified: Secondary | ICD-10-CM | POA: Diagnosis not present

## 2015-12-21 DIAGNOSIS — Z9981 Dependence on supplemental oxygen: Secondary | ICD-10-CM

## 2015-12-21 DIAGNOSIS — J9621 Acute and chronic respiratory failure with hypoxia: Secondary | ICD-10-CM | POA: Diagnosis present

## 2015-12-21 DIAGNOSIS — J9691 Respiratory failure, unspecified with hypoxia: Secondary | ICD-10-CM

## 2015-12-21 DIAGNOSIS — J9622 Acute and chronic respiratory failure with hypercapnia: Secondary | ICD-10-CM | POA: Diagnosis present

## 2015-12-21 DIAGNOSIS — E861 Hypovolemia: Secondary | ICD-10-CM | POA: Diagnosis present

## 2015-12-21 DIAGNOSIS — Z8521 Personal history of malignant neoplasm of larynx: Secondary | ICD-10-CM

## 2015-12-21 DIAGNOSIS — Z7982 Long term (current) use of aspirin: Secondary | ICD-10-CM

## 2015-12-21 DIAGNOSIS — J440 Chronic obstructive pulmonary disease with acute lower respiratory infection: Principal | ICD-10-CM | POA: Diagnosis present

## 2015-12-21 DIAGNOSIS — J9601 Acute respiratory failure with hypoxia: Secondary | ICD-10-CM

## 2015-12-21 DIAGNOSIS — E039 Hypothyroidism, unspecified: Secondary | ICD-10-CM | POA: Diagnosis present

## 2015-12-21 DIAGNOSIS — I270 Primary pulmonary hypertension: Secondary | ICD-10-CM | POA: Diagnosis present

## 2015-12-21 DIAGNOSIS — Z7901 Long term (current) use of anticoagulants: Secondary | ICD-10-CM

## 2015-12-21 DIAGNOSIS — I5032 Chronic diastolic (congestive) heart failure: Secondary | ICD-10-CM | POA: Diagnosis present

## 2015-12-21 DIAGNOSIS — Z8701 Personal history of pneumonia (recurrent): Secondary | ICD-10-CM

## 2015-12-21 DIAGNOSIS — J449 Chronic obstructive pulmonary disease, unspecified: Secondary | ICD-10-CM

## 2015-12-21 DIAGNOSIS — E785 Hyperlipidemia, unspecified: Secondary | ICD-10-CM | POA: Diagnosis present

## 2015-12-21 LAB — COMPREHENSIVE METABOLIC PANEL
ALBUMIN: 3.3 g/dL — AB (ref 3.5–5.0)
ALK PHOS: 65 U/L (ref 38–126)
ALT: 20 U/L (ref 17–63)
ANION GAP: 18 — AB (ref 5–15)
AST: 41 U/L (ref 15–41)
BILIRUBIN TOTAL: 0.7 mg/dL (ref 0.3–1.2)
BUN: 29 mg/dL — ABNORMAL HIGH (ref 6–20)
CALCIUM: 8.9 mg/dL (ref 8.9–10.3)
CO2: 20 mmol/L — ABNORMAL LOW (ref 22–32)
CREATININE: 1.28 mg/dL — AB (ref 0.61–1.24)
Chloride: 98 mmol/L — ABNORMAL LOW (ref 101–111)
GFR calc Af Amer: 55 mL/min — ABNORMAL LOW (ref >60)
GFR calc non Af Amer: 48 mL/min — ABNORMAL LOW (ref >60)
GLUCOSE: 170 mg/dL — AB (ref 65–99)
Potassium: 4.5 mmol/L (ref 3.5–5.1)
Sodium: 136 mmol/L (ref 135–145)
TOTAL PROTEIN: 6.8 g/dL (ref 6.5–8.1)

## 2015-12-21 LAB — URINALYSIS, ROUTINE W REFLEX MICROSCOPIC
BILIRUBIN URINE: NEGATIVE
Glucose, UA: NEGATIVE mg/dL
HGB URINE DIPSTICK: NEGATIVE
KETONES UR: NEGATIVE mg/dL
Leukocytes, UA: NEGATIVE
Nitrite: NEGATIVE
Protein, ur: NEGATIVE mg/dL
pH: 6 (ref 5.0–8.0)

## 2015-12-21 LAB — CBC WITH DIFFERENTIAL/PLATELET
BASOS ABS: 0 10*3/uL (ref 0.0–0.1)
Basophils Relative: 0 %
EOS PCT: 0 %
Eosinophils Absolute: 0 10*3/uL (ref 0.0–0.7)
HEMATOCRIT: 45.9 % (ref 39.0–52.0)
Hemoglobin: 14.8 g/dL (ref 13.0–17.0)
LYMPHS ABS: 1.4 10*3/uL (ref 0.7–4.0)
LYMPHS PCT: 8 %
MCH: 31.4 pg (ref 26.0–34.0)
MCHC: 32.2 g/dL (ref 30.0–36.0)
MCV: 97.5 fL (ref 78.0–100.0)
MONO ABS: 1 10*3/uL (ref 0.1–1.0)
MONOS PCT: 6 %
NEUTROS ABS: 14.9 10*3/uL — AB (ref 1.7–7.7)
Neutrophils Relative %: 86 %
PLATELETS: 234 10*3/uL (ref 150–400)
RBC: 4.71 MIL/uL (ref 4.22–5.81)
RDW: 14.2 % (ref 11.5–15.5)
WBC: 17.2 10*3/uL — ABNORMAL HIGH (ref 4.0–10.5)

## 2015-12-21 LAB — I-STAT ARTERIAL BLOOD GAS, ED
ACID-BASE DEFICIT: 1 mmol/L (ref 0.0–2.0)
Bicarbonate: 24.6 mEq/L — ABNORMAL HIGH (ref 20.0–24.0)
O2 SAT: 95 %
PH ART: 7.365 (ref 7.350–7.450)
PO2 ART: 77 mmHg — AB (ref 80.0–100.0)
TCO2: 26 mmol/L (ref 0–100)
pCO2 arterial: 43 mmHg (ref 35.0–45.0)

## 2015-12-21 LAB — BRAIN NATRIURETIC PEPTIDE: B Natriuretic Peptide: 163.4 pg/mL — ABNORMAL HIGH (ref 0.0–100.0)

## 2015-12-21 LAB — I-STAT CG4 LACTIC ACID, ED: Lactic Acid, Venous: 3.05 mmol/L (ref 0.5–2.0)

## 2015-12-21 LAB — PROTIME-INR
INR: 3.83 — ABNORMAL HIGH (ref 0.00–1.49)
Prothrombin Time: 36.8 seconds — ABNORMAL HIGH (ref 11.6–15.2)

## 2015-12-21 LAB — TROPONIN I: TROPONIN I: 0.05 ng/mL — AB (ref <0.031)

## 2015-12-21 LAB — LIPASE, BLOOD: Lipase: 20 U/L (ref 11–51)

## 2015-12-21 MED ORDER — FUROSEMIDE 20 MG PO TABS: 40.0000 mg | ORAL_TABLET | Freq: Every day | ORAL | Status: DC

## 2015-12-21 MED ORDER — SODIUM CHLORIDE 0.9 % IV BOLUS (SEPSIS)
30.0000 mL/kg | Freq: Once | INTRAVENOUS | Status: AC
  Administered 2015-12-21: 1983 mL via INTRAVENOUS

## 2015-12-21 MED ORDER — ADULT MULTIVITAMIN W/MINERALS CH
1.0000 | ORAL_TABLET | Freq: Every day | ORAL | Status: DC
  Administered 2015-12-22 – 2015-12-24 (×3): 1 via ORAL
  Filled 2015-12-21 (×3): qty 1

## 2015-12-21 MED ORDER — AZITHROMYCIN 1 G PO PACK
1.0000 g | PACK | Freq: Once | ORAL | Status: DC
  Filled 2015-12-21: qty 1

## 2015-12-21 MED ORDER — GUAIFENESIN ER 600 MG PO TB12: 600.0000 mg | ORAL_TABLET | Freq: Two times a day (BID) | ORAL | Status: DC | PRN

## 2015-12-21 MED ORDER — SODIUM CHLORIDE 0.9 % IV SOLN
INTRAVENOUS | Status: DC
  Administered 2015-12-21: via INTRAVENOUS

## 2015-12-21 MED ORDER — ACETAMINOPHEN 650 MG RE SUPP: 650.0000 mg | Freq: Four times a day (QID) | RECTAL | Status: DC | PRN

## 2015-12-21 MED ORDER — TAMSULOSIN HCL 0.4 MG PO CAPS
0.4000 mg | ORAL_CAPSULE | Freq: Every day | ORAL | Status: DC
  Administered 2015-12-22 – 2015-12-24 (×3): 0.4 mg via ORAL
  Filled 2015-12-21 (×3): qty 1

## 2015-12-21 MED ORDER — ATORVASTATIN CALCIUM 40 MG PO TABS
40.0000 mg | ORAL_TABLET | Freq: Every day | ORAL | Status: DC
  Administered 2015-12-22 – 2015-12-23 (×2): 40 mg via ORAL
  Filled 2015-12-21 (×2): qty 1

## 2015-12-21 MED ORDER — DILTIAZEM HCL 25 MG/5ML IV SOLN
10.0000 mg | Freq: Once | INTRAVENOUS | Status: DC
  Filled 2015-12-21: qty 5

## 2015-12-21 MED ORDER — ASPIRIN EC 81 MG PO TBEC
81.0000 mg | DELAYED_RELEASE_TABLET | Freq: Every day | ORAL | Status: DC
  Administered 2015-12-22 – 2015-12-24 (×3): 81 mg via ORAL
  Filled 2015-12-21 (×3): qty 1

## 2015-12-21 MED ORDER — ACETAMINOPHEN 325 MG PO TABS: 650.0000 mg | ORAL_TABLET | Freq: Four times a day (QID) | ORAL | Status: DC | PRN

## 2015-12-21 MED ORDER — DEXTROSE 5 % IV SOLN
1.0000 g | Freq: Once | INTRAVENOUS | Status: AC
  Administered 2015-12-21: 1 g via INTRAVENOUS
  Filled 2015-12-21: qty 10

## 2015-12-21 MED ORDER — WARFARIN - PHARMACIST DOSING INPATIENT: Freq: Every day | Status: DC

## 2015-12-21 MED ORDER — PREDNISONE 20 MG PO TABS
40.0000 mg | ORAL_TABLET | Freq: Every day | ORAL | Status: DC
  Administered 2015-12-22 – 2015-12-24 (×3): 40 mg via ORAL
  Filled 2015-12-21 (×3): qty 2

## 2015-12-21 MED ORDER — DEXTROSE 5 % IV SOLN
500.0000 mg | Freq: Once | INTRAVENOUS | Status: AC
  Administered 2015-12-21: 500 mg via INTRAVENOUS
  Filled 2015-12-21: qty 500

## 2015-12-21 MED ORDER — IPRATROPIUM-ALBUTEROL 0.5-2.5 (3) MG/3ML IN SOLN
3.0000 mL | Freq: Four times a day (QID) | RESPIRATORY_TRACT | Status: DC
  Administered 2015-12-21 – 2015-12-24 (×12): 3 mL via RESPIRATORY_TRACT
  Filled 2015-12-21 (×12): qty 3

## 2015-12-21 MED ORDER — IPRATROPIUM-ALBUTEROL 0.5-2.5 (3) MG/3ML IN SOLN
3.0000 mL | Freq: Once | RESPIRATORY_TRACT | Status: AC
  Administered 2015-12-21: 3 mL via RESPIRATORY_TRACT
  Filled 2015-12-21: qty 3

## 2015-12-21 MED ORDER — SODIUM CHLORIDE 0.9% FLUSH: 3.0000 mL | INTRAVENOUS | Status: DC | PRN

## 2015-12-21 MED ORDER — LEVOTHYROXINE SODIUM 75 MCG PO TABS
75.0000 ug | ORAL_TABLET | Freq: Every day | ORAL | Status: DC
  Administered 2015-12-22 – 2015-12-24 (×3): 75 ug via ORAL
  Filled 2015-12-21 (×4): qty 1

## 2015-12-21 MED ORDER — DIGOXIN 125 MCG PO TABS: 0.1250 mg | ORAL_TABLET | Freq: Every day | ORAL | Status: DC

## 2015-12-21 MED ORDER — SODIUM CHLORIDE 0.9% FLUSH
3.0000 mL | Freq: Two times a day (BID) | INTRAVENOUS | Status: DC
  Administered 2015-12-21 – 2015-12-24 (×6): 3 mL via INTRAVENOUS

## 2015-12-21 NOTE — ED Notes (Signed)
Pt just now reports nausea, vomiting, diarrhea 3-4x/day.

## 2015-12-21 NOTE — ED Provider Notes (Signed)
CSN: [id number]     Arrival date & time 12/21/15  1615 History   First MD Initiated Contact with Patient 12/21/15 1620     Chief Complaint  Patient presents with  . Shortness of Breath  . Atrial Fibrillation     (Consider location/radiation/quality/duration/timing/severity/associated sxs/prior Treatment) HPI Patient reports he's had increased cough and shortness of breath for about 3 days. No fever that he is aware of. No chest pain. He reports he has become very short of breath as of today. He also reports he's had vomiting and diarrhea for the past 3 days. He estimates 3-4 episodes of diarrhea per day. Patient uses home oxygen at 2 L. He has increase that to 4 L. Patient has chronic atrial fibrillation. EMS reports on their arrival patient had oxygen saturation around 70-80% on his 4 L. They also report heart rate was at the 150s to 160s. A she was given an albuterol treatment and Solu-Medrol with improved oxygen saturation up to 97% and heart rate down to 120s. Patient reports he has had pneumonia several times in the past. Past Medical History  Diagnosis Date  . Chronic atrial fibrillation ([place])   . Dilated cardiomyopathy ([place])   . PVC's (premature ventricular contractions)   . Hyperlipidemia   . COPD (chronic obstructive pulmonary disease) ([place])   . Hypothyroidism   . Renal calculi   . Chronic anticoagulation   . CHF (congestive heart failure) ([facility])     DUE TO SYSTOLIC DYSFUNCTION  . Shortness of breath     exertional  . Cancer ([facility])     vocal cord - radiation    Past Surgical History  Procedure Laterality Date  . Cardiac catheterization  02/26/2007    EF 35-40%  . Appendectomy    . Tonsillectomy and adenoidectomy    . US echocardiography  07/08/2010    EF 50-55%  . US echocardiography  02/16/2007    EF 25-35%  . Transthoracic echocardiogram  08/26/2001    EF 25-30%  . Cardiovascular stress test  02/18/2007    EF 40%  . Microlaryngoscopy with co2 laser and excision of vocal  cord lesion      15 years ago  . Lithotripsy    . Inguinal hernia repair Right 12/28/2012    Procedure: HERNIA REPAIR INGUINAL ADULT;  Surgeon: Joyice Faster. Cornett, MD;  Location: [facility] OR;  Service: General;  Laterality: Right;   Family History  Problem Relation Age of Onset  . Stroke Mother   . Seizures Mother   . Heart attack Father   . Heart failure Father   . Heart attack Brother    Social History  Substance Use Topics  . Smoking status: Former Smoker -- 1.00 packs/day for 35 years    Types: Cigarettes    Quit date: 07/11/1961  . Smokeless tobacco: None  . Alcohol Use: No    Review of Systems  10 Systems reviewed and are negative for acute change except as noted in the HPI.   Allergies  Review of patient's allergies indicates no known allergies.  Home Medications   Prior to Admission medications   Medication Sig Start Date End Date Taking? Authorizing Provider  ANORO ELLIPTA 62.5-25 MCG/INH AEPB INHALE 1 PUFF INTO THE LUNGS DAILY. 09/18/15   Tanda Rockers, MD  aspirin 81 MG tablet Take 81 mg by mouth daily.    Historical Provider, MD  Calcium-Magnesium-Zinc (CAL-MAG-ZINC PO) Take 1 tablet by mouth daily.     Historical Provider, MD  dextromethorphan-guaiFENesin (MUCINEX DM) 30-600 MG per 12 hr tablet Take 1-2 tablets by mouth every 12 (twelve) hours as needed (cough, congestion).    Historical Provider, MD  digoxin (LANOXIN) 0.125 MG tablet Take 1 tablet (0.125 mg total) by mouth daily. 09/05/15   Peter M Martinique, MD  furosemide (LASIX) 40 MG tablet Take 40 mg by mouth daily. May take 1 extra daily as needed for leg swelling 03/27/14   Peter M Martinique, MD  levothyroxine (SYNTHROID, LEVOTHROID) 75 MCG tablet Take 1 tablet (75 mcg total) by mouth daily. 10/01/15   Peter M Martinique, MD  losartan (COZAAR) 25 MG tablet Take 25 mg by mouth daily.  12/14/13   Historical Provider, MD  Multiple Vitamin (MULTIVITAMIN) tablet Take 1 tablet by mouth daily.     Historical Provider, MD  PROAIR  HFA 108 (90 BASE) MCG/ACT inhaler Inhale 2 puffs into the lungs every 4 (four) hours as needed for wheezing or shortness of breath.  03/02/14   Historical Provider, MD  simvastatin (ZOCOR) 80 MG tablet Take 1 tablet (80 mg total) by mouth at bedtime. 04/05/15   Peter M Martinique, MD  tamsulosin (FLOMAX) 0.4 MG CAPS capsule Take 0.4 mg by mouth daily after breakfast.     Historical Provider, MD  warfarin (COUMADIN) 3 MG tablet Take as directed by coumadin clinic 12/10/15   Peter M Martinique, MD   BP 107/63 mmHg  Pulse 115  Temp(Src) 97.4 F (36.3 C) (Oral)  Resp 21  Wt 125 lb (56.7 kg)  SpO2 93% Physical Exam  Constitutional: He is oriented to person, place, and time. He appears well-developed and well-nourished.  Patient is alert. He has moderate respiratory distress at rest. He is nontoxic.  HENT:  Head: Normocephalic and atraumatic.  Eyes: EOM are normal. Pupils are equal, round, and reactive to light.  Neck: Neck supple.  Cardiovascular:  Tachycardia, irregularly irregular heart rhythm.  Pulmonary/Chest:  Moderate respiratory distress at rest, speaking in short sentences. Rales and wheezes throughout lung fields.  Abdominal: Soft. Bowel sounds are normal. He exhibits no distension. There is no tenderness.  Musculoskeletal: Normal range of motion. He exhibits no edema or tenderness.  Neurological: He is alert and oriented to person, place, and time. He has normal strength. Coordination normal. GCS eye subscore is 4. GCS verbal subscore is 5. GCS motor subscore is 6.  Skin: Skin is warm, dry and intact.  Psychiatric: He has a normal mood and affect.    ED Course  Procedures (including critical care time) CRITICAL CARE Performed by: Charlesetta Shanks   Total critical care time: 45 minutes  Critical care time was exclusive of separately billable procedures and treating other patients.  Critical care was necessary to treat or prevent imminent or life-threatening deterioration.  Critical  care was time spent personally by me on the following activities: development of treatment plan with patient and/or surrogate as well as nursing, discussions with consultants, evaluation of patient's response to treatment, examination of patient, obtaining history from patient or surrogate, ordering and performing treatments and interventions, ordering and review of laboratory studies, ordering and review of radiographic studies, pulse oximetry and re-evaluation of patient's condition. Labs Review Labs Reviewed  BRAIN NATRIURETIC PEPTIDE - Abnormal; Notable for the following:    B Natriuretic Peptide 163.4 (*)    All other components within normal limits  CBC WITH DIFFERENTIAL/PLATELET - Abnormal; Notable for the following:    WBC 17.2 (*)    Neutro Abs 14.9 (*)  All other components within normal limits  PROTIME-INR - Abnormal; Notable for the following:    Prothrombin Time 36.8 (*)    INR 3.83 (*)    All other components within normal limits  I-STAT CG4 LACTIC ACID, ED - Abnormal; Notable for the following:    Lactic Acid, Venous 3.05 (*)    All other components within normal limits  I-STAT ARTERIAL BLOOD GAS, ED - Abnormal; Notable for the following:    pO2, Arterial 77.0 (*)    Bicarbonate 24.6 (*)    All other components within normal limits  CULTURE, BLOOD (ROUTINE X 2)  CULTURE, BLOOD (ROUTINE X 2)  COMPREHENSIVE METABOLIC PANEL  LIPASE, BLOOD  TROPONIN I  URINALYSIS, ROUTINE W REFLEX MICROSCOPIC (NOT AT [facility])    Imaging Review Dg Chest Port 1 View  12/21/2015  CLINICAL DATA:  Acute shortness of Breath EXAM: PORTABLE CHEST 1 VIEW COMPARISON:  11/29/2014 FINDINGS: Mild bilateral interstitial and alveolar opacities, left greater than right, improved somewhat since prior study, more so on the right. Suspect small effusions. Heart is upper limits normal in size. Underlying COPD/hyperinflation. IMPRESSION: Improving interstitial and alveolar opacities with mild persistent asymmetric  edema pattern, left greater than right. Suspect small effusions. Electronically Signed   By: Rolm Baptise M.D.   On: 12/21/2015 16:55   I have personally reviewed and evaluated these images and lab results as part of my medical decision-making.   EKG Interpretation   Date/Time:  Friday December 21 2015 16:22:05 EST Ventricular Rate:  144 PR Interval:    QRS Duration: 87 QT Interval:  367 QTC Calculation: 568 R Axis:   96 Text Interpretation:  Atrial fibrillation with rapid V-rate Ventricular  premature complex Anterior infarct, age indeterminate Confirmed by  Johnney Killian, MD, Jeannie Done [id number]) on 12/21/2015 6:19:12 PM     Recheck 17:10 patient has improved on BiPAP. He is alert. He reports subjective improvement. Heart rate now is atrial fibrillation low 100s. Blood pressure 110. At this time, with heart rate having decreased significantly with treatment I will hold the Cardizem and proceed with pneumonia treatment. Chest x-ray shows patchy areas of infiltrate, patient has leukocytosis and elevated lactic acid. Consult: 1750 resident for unassigned for Dr. Gilles Chiquito. Except for stepdown placement Recheck 18:16 patient is tolerating BiPAP well. He is resting quietly and awakens to light voice with clear mental status. MDM   Final diagnoses:  Community acquired pneumonia  Chronic obstructive pulmonary disease, unspecified COPD type ([place])   Patient presents with respiratory distress. He has had cough and history of COPD as well as atrial fibrillation. Patient had a treated with an albuterol treatment and Solu-Medrol on route. The patient had moderate to severe increased work of breathing. He is placed on BiPAP with good tolerance and improved respiratory status. Antibiotics and fluids have been initiated for community-acquired pneumonia. Heart rate has improved significantly since treatment. Initial heart rate I EMS report was up to 150s and 160s. As patient is making improvement with fluids and  respiratory treatment, Cardizem has not yet been initiated. Patient will be admitted to step down for continued treatment.    Charlesetta Shanks, MD 12/21/15 [phone]

## 2015-12-21 NOTE — ED Notes (Signed)
Admitting MD gave verbal order to try the venturi mask at 8L and if unsuccessful then to pt pt back on Bi-pap.

## 2015-12-21 NOTE — ED Notes (Signed)
Paged Dr Daryll Drown to inform her of elevated troponin. Dr Daryll Drown now at bedside with patient.

## 2015-12-21 NOTE — H&P (Signed)
Date: 12/21/2015               Patient Name:  Logan [name] MRN: [id number]  DOB: [birth year] Age / Sex: 82 y.o., male   PCP: Leonard Downing, MD         Medical Service: Internal Medicine Teaching Service         Attending Physician: Dr. Sid Falcon, MD    First Contact: Dr. Ignacia Marvel Pager: [phone]  Second Contact: Dr. Julious Oka Pager: [phone]       After Hours (After 5p/  First Contact Pager: [phone]  weekends / holidays): Second Contact Pager: [phone]   Chief Complaint: Nausea/Vomiting/Diarrhea and Shortness of breath  History of Present Illness: Logan [name] is an 82 year old man with a past medical history of COPD GOLD stage IV, A.fib on warfarin, CHF with dilated cardiomyopathy, hypothyroidism, previous vocal cord cancer s/p radiation presents to [facility] ED tonight with nausea/vomiting/diarrhea and worsening shortness of breath. He reports that he has had the N/V/D for the past 3 days. He has had 3-4 episodes of non-bloody diarrhea a day with associated nausea and vomiting. Symptoms have been improving but he reports worsening shortness of breath for the past 2 nights. He reports using 2L O2 at night and when he leaves home but does not regularly use O2 during the day. Denies any fevers or chills. No increased cough or sputum production from baseline and no change in sputum color. No recent antibiotic use or travel. Reports that his wife was admitted with similar symptoms of nausea/vomiting/diarrhea and discharged today. He is unable to recall what she was diagnosed with at that time. He called EMS today concerned with his shortness of breath. When EMS arrived his O2 sat was 70-80% on home 2L O2 with HR 150-160s. Received albuterol and solu-medrol with improved O2 sat to 97% and HR improved to 120s. Noted to have significant increased work of breathing and diffuse wheezing and rales on arrival to the ED. Placed on BiPAP with improvement.  Reports a history of PNA. Denies any  myalgias, chest pain, headache. Did get his flu shot this year.   Meds: No current facility-administered medications for this encounter.   Current Outpatient Prescriptions  Medication Sig Dispense Refill  . ANORO ELLIPTA 62.5-25 MCG/INH AEPB INHALE 1 PUFF INTO THE LUNGS DAILY. 60 each 11  . aspirin 81 MG tablet Take 81 mg by mouth daily.    . Calcium-Magnesium-Zinc (CAL-MAG-ZINC PO) Take 1 tablet by mouth daily.     Marland Kitchen dextromethorphan-guaiFENesin (MUCINEX DM) 30-600 MG per 12 hr tablet Take 1-2 tablets by mouth every 12 (twelve) hours as needed (cough, congestion).    Marland Kitchen digoxin (LANOXIN) 0.125 MG tablet Take 1 tablet (0.125 mg total) by mouth daily. 30 tablet 11  . furosemide (LASIX) 40 MG tablet Take 40 mg by mouth daily. May take 1 extra daily as needed for leg swelling    . levothyroxine (SYNTHROID, LEVOTHROID) 75 MCG tablet Take 1 tablet (75 mcg total) by mouth daily. 30 tablet 2  . losartan (COZAAR) 25 MG tablet Take 25 mg by mouth daily.     . Multiple Vitamin (MULTIVITAMIN) tablet Take 1 tablet by mouth daily.     Marland Kitchen PROAIR HFA 108 (90 BASE) MCG/ACT inhaler Inhale 2 puffs into the lungs every 4 (four) hours as needed for wheezing or shortness of breath.     . simvastatin (ZOCOR) 80 MG tablet Take 1 tablet (80 mg total) by mouth  at bedtime. 30 tablet 9  . tamsulosin (FLOMAX) 0.4 MG CAPS capsule Take 0.4 mg by mouth daily after breakfast.     . warfarin (COUMADIN) 3 MG tablet Take as directed by coumadin clinic 135 tablet 1    Allergies: Allergies as of 12/21/2015  . (No Known Allergies)   Past Medical History  Diagnosis Date  . Chronic atrial fibrillation ([place])   . Dilated cardiomyopathy ([place])   . PVC's (premature ventricular contractions)   . Hyperlipidemia   . COPD (chronic obstructive pulmonary disease) ([place])   . Hypothyroidism   . Renal calculi   . Chronic anticoagulation   . CHF (congestive heart failure) (HCC)     DUE TO SYSTOLIC DYSFUNCTION  . Shortness of breath      exertional  . Cancer (HCC)     vocal cord - radiation    Past Surgical History  Procedure Laterality Date  . Cardiac catheterization  02/26/2007    EF 35-40%  . Appendectomy    . Tonsillectomy and adenoidectomy    . US echocardiography  07/08/2010    EF 50-55%  . US echocardiography  02/16/2007    EF 25-35%  . Transthoracic echocardiogram  08/26/2001    EF 25-30%  . Cardiovascular stress test  02/18/2007    EF 40%  . Microlaryngoscopy with co2 laser and excision of vocal cord lesion      15 years ago  . Lithotripsy    . Inguinal hernia repair Right 12/28/2012    Procedure: HERNIA REPAIR INGUINAL ADULT;  Surgeon: Joyice Faster. Cornett, MD;  Location: [facility];  Service: General;  Laterality: Right;   Family History  Problem Relation Age of Onset  . Stroke Mother   . Seizures Mother   . Heart attack Father   . Heart failure Father   . Heart attack Brother    Social History   Social History  . Marital Status: Married    Spouse Name: N/A  . Number of Children: 0  . Years of Education: N/A   Occupational History  . electrician-retired    Social History Main Topics  . Smoking status: Former Smoker -- 1.00 packs/day for 35 years    Types: Cigarettes    Quit date: 07/11/1961  . Smokeless tobacco: Not on file  . Alcohol Use: No  . Drug Use: No  . Sexual Activity: Not on file   Other Topics Concern  . Not on file   Social History Narrative    Review of Systems: Pertinent items noted in HPI and remainder of comprehensive ROS otherwise negative.  Physical Exam: Blood pressure 93/63, pulse 107, temperature 97.4 F (36.3 C), temperature source Oral, resp. rate 22, weight 125 lb (56.7 kg), SpO2 98 %. General: alert, well-developed, and cooperative to examination.  Head: normocephalic and atraumatic.  Eyes: vision grossly intact, pupils equal, pupils round, pupils reactive to light, no injection and anicteric.  Mouth: pharynx pink and moist, no erythema, and no exudates.  Neck:  supple, full ROM, no thyromegaly, + JVD, and no carotid bruits.  Lungs: normal respiratory effort, no accessory muscle use, coarse breath sounds at bilateral lung bases, no wheezing Heart: tachycardic, irregularly irregular, no murmur, no gallop, and no rub.  Abdomen: soft, non-tender, normal bowel sounds, no distention, no guarding, no rebound tenderness, no hepatomegaly, and no splenomegaly.  Msk: no joint swelling, no joint warmth, and no redness over joints.  Pulses: 2+ DP/PT pulses bilaterally Extremities: no pedal edema Neurologic: alert & oriented X3,  cranial nerves II-XII intact, no focal deficits Skin: turgor normal and no rashes.  Psych: normal mood and affect  Lab results: Basic Metabolic Panel:  Recent Labs  12/21/15 1633  NA 136  K 4.5  CL 98*  CO2 20*  GLUCOSE 170*  BUN 29*  CREATININE 1.28*  CALCIUM 8.9   Liver Function Tests:  Recent Labs  12/21/15 1633  AST 41  ALT 20  ALKPHOS 65  BILITOT 0.7  PROT 6.8  ALBUMIN 3.3*    Recent Labs  12/21/15 1633  LIPASE 20   CBC:  Recent Labs  12/21/15 1633  WBC 17.2*  NEUTROABS 14.9*  HGB 14.8  HCT 45.9  MCV 97.5  PLT 234   Cardiac Enzymes:  Recent Labs  12/21/15 1633  TROPONINI 0.05*   Coagulation:  Recent Labs  12/21/15 1633  LABPROT 36.8*  INR 3.83*   Urinalysis:  Recent Labs  12/21/15 2026  COLORURINE YELLOW  LABSPEC >1.030*  PHURINE 6.0  GLUCOSEU NEGATIVE  HGBUR NEGATIVE  BILIRUBINUR NEGATIVE  KETONESUR NEGATIVE  PROTEINUR NEGATIVE  NITRITE NEGATIVE  LEUKOCYTESUR NEGATIVE   Imaging results:  Dg Chest Port 1 View  12/21/2015  CLINICAL DATA:  Acute shortness of Breath EXAM: PORTABLE CHEST 1 VIEW COMPARISON:  11/29/2014 FINDINGS: Mild bilateral interstitial and alveolar opacities, left greater than right, improved somewhat since prior study, more so on the right. Suspect small effusions. Heart is upper limits normal in size. Underlying COPD/hyperinflation. IMPRESSION:  Improving interstitial and alveolar opacities with mild persistent asymmetric edema pattern, left greater than right. Suspect small effusions. Electronically Signed   By: Rolm Baptise M.D.   On: 12/21/2015 16:55   Other results: EKG: atrial fibrillation, rate 144.  Assessment & Plan by Problem:  Acute hypoxic respiratory failure likely 2/2 acute viral illness: Patient with 3 day history of nausea/vomiting/diarrhea. Symptoms improving but has had worsening shortness of breath. Was found to be hypoxic with O2 sats 70-80% by EMS on his home 2L O2. ABG 7.36/43/77/24. Improved with BiPAP in the ED. CXR showed improving interstitial and alveolar opacities (compared to 11/2014 CXR) with mild persistent asymmetric edema, L>R likely small effusions vs interstitial infiltrates. Afebrile in the ED but tachycardic, tachypneic with leukocytosis to 17.5. Lactate elevated at 3.05. Received Azithromycin and Ceftriaxone in the ED. Received Solumedrol en route to the ED. Patient denies any worsening cough, sputum production or change in sputum color. Appears euvolemic to mildly fluid up on exam. Weights stable from previous records. BNP 163, improved from measurements in the past though was 73 last year. Likely mild COPD exacerbation in the setting of viral illness.  Wife recently sick with bronchitis. N/V/D improving, no recent abx or travel. Not convinced of PNA on CXR, will check 2 view in to further elucidate.  -Hold Lasix -Prednisone 40 mg PO daily -Guaifenesin prn -Duonebs q6hr -Telemetry -BMP and CBC in am -Flu swab -Trend lactate  -PT/OT eval and treat -Maintain O2 sats 90-92% -BiPAP prn -Follow up blood cultures -CXR PA/Lateral in am -Strep pneum and legionella urine Ags  Elevated Troponin: Trop 0.05 -> 0.7. No changes on EKG, in A. Fib. Denies any chest pain. Likely 2/2 hypoxia. -trending troponin -repeat EKG in am  AKI: Cr 1.28 today up form baseline 0.8 in February. Likely prerenal 2/2 diarrhea.  Diarrhea improving. Will hold his home dose Lasix and monitor.  -Hold lasix  -BMP in am  GOLD Stage IV COPD: PFT's 02/07/14 FEV1 0.69 (31%) ratio 41 no change p B2 and  DLCO 30%. Uses Anoro Ellipta inhaler daily. Reports he does not use albuterol inhaler. On 2L O2 at night and when leaves the home but does not use it during the day routinely.  -Duonebs q6hr -Keep O2 sats 90-92%  Chronic A. Fib: On warfarin, coreg and digoxin at home. Metoprolol noted on 12/2014 cardiology note but not on home med list. HR 150-160s initially. Improved to 100-110.  -Warfain per pharmacy  -Daily INR -Digoxin 0.125 mcg daily  Hypothyroidism: On levothyroxine 75 mcg daily.  continue home dose synthroid  Diastolic CHF with dilated cardiomyopathy: In 2008 ejection fraction was 25-35%. Repeat echocardiogram in Jan 2015 showed EF of 50-55%. Last cardiology note from March 2016 shows he is suppose to be taking metoprolol but it is not listed on his home medications. On Losartan 25 mg daily and Lasix 40 mg PO daily. ASA noted on home med list but last cardiology note shows not recommended. Will hold ASA.  -losartan 25 mg daily -holding lasix per above  HLD: Patient on simvastatin at home. -Continue home dose simvastatin  Dispo: Disposition is deferred at this time, awaiting improvement of current medical problems. Anticipated discharge in approximately 1-2 day(s).   The patient does have a current PCP Redmond Pulling Arna Medici, MD) and does need an [facility] hospital follow-up appointment after discharge.  The patient does not have transportation limitations that hinder transportation to clinic appointments.  Signed: Maryellen Pile, MD 12/21/2015, 10:06 PM

## 2015-12-21 NOTE — ED Notes (Signed)
Pt's o2 sat on 2L [place] in the mid 80s, Admitting MD paged.

## 2015-12-21 NOTE — ED Notes (Addendum)
Per EMS - pt hx afib. Pt usually wears 2L [place] at home, increased to 4L [place] over last 3-4 days. Pt wife sick w/ bronchitis. Pt increased shortness of breath x 3-4 days. Wheezing and rhonchi in all lobes, given 5 albuterol. Pt initially in afib around 150-160bpm. SpO2 78-80% on 4L. Now 97% on breathing tx. Hr 130bpm. Cardizem was hung but never given. Given 125mg  solumedrol

## 2015-12-21 NOTE — Progress Notes (Signed)
ANTICOAGULATION CONSULT NOTE - Initial Consult  Pharmacy Consult for Warfarin Indication: atrial fibrillation  No Known Allergies  Patient Measurements: Weight: 125 lb (56.7 kg) Heparin Dosing Weight:   Vital Signs: Temp: 97.4 F (36.3 C) (03/03 1636) Temp Source: Oral (03/03 1636) BP: 100/65 mmHg (03/03 2200) Pulse Rate: 53 (03/03 2200)  Labs:  Recent Labs  12/21/15 1633  HGB 14.8  HCT 45.9  PLT 234  LABPROT 36.8*  INR 3.83*  CREATININE 1.28*  TROPONINI 0.05*    Estimated Creatinine Clearance: 31.4 mL/min (by C-G formula based on Cr of 1.28).   Medical History: Past Medical History  Diagnosis Date  . Chronic atrial fibrillation ([place])   . Dilated cardiomyopathy ([place])   . PVC's (premature ventricular contractions)   . Hyperlipidemia   . COPD (chronic obstructive pulmonary disease) ([place])   . Hypothyroidism   . Renal calculi   . Chronic anticoagulation   . CHF (congestive heart failure) (H[facility])     DUE TO SYSTOLIC DYSFUNCTION  . Shortness of breath     exertional  . Cancer (H[facility])     vocal cord - radiation     Medications:   (Not in a hospital admission) Scheduled:  . ipratropium-albuterol  3 mL Nebulization Q6H  . [START ON 12/22/2015] predniSONE  40 mg Oral Q breakfast   Infusions:    Assessment: 82yo male with history of Afib on warfarin PTA presents with N/V/D and worsening SOB. Pharmacy is consulted to dose warfarin for atrial fibrillation. INR 3.83, CBC wnl, sCr 1.28.  PTA Warfarin Dose: 3mg  Sun and 4.5mg  AODs with last dose 3/3  Goal of Therapy:  INR 2-3 Monitor platelets by anticoagulation protocol: Yes   Plan:  Hold warfarin tonight Daily INR Monitor s/sx of bleeding  Andrey Cota. Diona Foley, PharmD, [place] Pharmacist Pager [phone] 12/21/2015,10:24 PM

## 2015-12-22 DIAGNOSIS — Z7901 Long term (current) use of anticoagulants: Secondary | ICD-10-CM

## 2015-12-22 DIAGNOSIS — E039 Hypothyroidism, unspecified: Secondary | ICD-10-CM

## 2015-12-22 DIAGNOSIS — R197 Diarrhea, unspecified: Secondary | ICD-10-CM

## 2015-12-22 DIAGNOSIS — Z9981 Dependence on supplemental oxygen: Secondary | ICD-10-CM | POA: Diagnosis not present

## 2015-12-22 DIAGNOSIS — Z87891 Personal history of nicotine dependence: Secondary | ICD-10-CM

## 2015-12-22 DIAGNOSIS — R911 Solitary pulmonary nodule: Secondary | ICD-10-CM | POA: Diagnosis present

## 2015-12-22 DIAGNOSIS — N179 Acute kidney failure, unspecified: Secondary | ICD-10-CM | POA: Diagnosis present

## 2015-12-22 DIAGNOSIS — J189 Pneumonia, unspecified organism: Secondary | ICD-10-CM | POA: Diagnosis present

## 2015-12-22 DIAGNOSIS — R0600 Dyspnea, unspecified: Secondary | ICD-10-CM | POA: Diagnosis present

## 2015-12-22 DIAGNOSIS — J449 Chronic obstructive pulmonary disease, unspecified: Secondary | ICD-10-CM | POA: Diagnosis not present

## 2015-12-22 DIAGNOSIS — I482 Chronic atrial fibrillation: Secondary | ICD-10-CM

## 2015-12-22 DIAGNOSIS — J9622 Acute and chronic respiratory failure with hypercapnia: Secondary | ICD-10-CM | POA: Diagnosis present

## 2015-12-22 DIAGNOSIS — Z7982 Long term (current) use of aspirin: Secondary | ICD-10-CM | POA: Diagnosis not present

## 2015-12-22 DIAGNOSIS — I5032 Chronic diastolic (congestive) heart failure: Secondary | ICD-10-CM | POA: Diagnosis present

## 2015-12-22 DIAGNOSIS — I493 Ventricular premature depolarization: Secondary | ICD-10-CM | POA: Diagnosis not present

## 2015-12-22 DIAGNOSIS — J9621 Acute and chronic respiratory failure with hypoxia: Secondary | ICD-10-CM | POA: Diagnosis present

## 2015-12-22 DIAGNOSIS — Z8701 Personal history of pneumonia (recurrent): Secondary | ICD-10-CM | POA: Diagnosis not present

## 2015-12-22 DIAGNOSIS — I270 Primary pulmonary hypertension: Secondary | ICD-10-CM | POA: Diagnosis present

## 2015-12-22 DIAGNOSIS — J441 Chronic obstructive pulmonary disease with (acute) exacerbation: Secondary | ICD-10-CM | POA: Diagnosis present

## 2015-12-22 DIAGNOSIS — I420 Dilated cardiomyopathy: Secondary | ICD-10-CM

## 2015-12-22 DIAGNOSIS — E861 Hypovolemia: Secondary | ICD-10-CM | POA: Diagnosis present

## 2015-12-22 DIAGNOSIS — J440 Chronic obstructive pulmonary disease with acute lower respiratory infection: Secondary | ICD-10-CM | POA: Diagnosis present

## 2015-12-22 DIAGNOSIS — I4891 Unspecified atrial fibrillation: Secondary | ICD-10-CM | POA: Diagnosis not present

## 2015-12-22 DIAGNOSIS — J9601 Acute respiratory failure with hypoxia: Secondary | ICD-10-CM | POA: Diagnosis present

## 2015-12-22 DIAGNOSIS — Z8521 Personal history of malignant neoplasm of larynx: Secondary | ICD-10-CM | POA: Diagnosis not present

## 2015-12-22 DIAGNOSIS — R778 Other specified abnormalities of plasma proteins: Secondary | ICD-10-CM

## 2015-12-22 DIAGNOSIS — R918 Other nonspecific abnormal finding of lung field: Secondary | ICD-10-CM

## 2015-12-22 DIAGNOSIS — E785 Hyperlipidemia, unspecified: Secondary | ICD-10-CM | POA: Diagnosis present

## 2015-12-22 LAB — PROTIME-INR
INR: 5.37 — AB (ref 0.00–1.49)
Prothrombin Time: 47.7 seconds — ABNORMAL HIGH (ref 11.6–15.2)

## 2015-12-22 LAB — MAGNESIUM: MAGNESIUM: 2 mg/dL (ref 1.7–2.4)

## 2015-12-22 LAB — CBC WITH DIFFERENTIAL/PLATELET
BASOS PCT: 0 %
Basophils Absolute: 0 10*3/uL (ref 0.0–0.1)
Eosinophils Absolute: 0 10*3/uL (ref 0.0–0.7)
Eosinophils Relative: 0 %
HEMATOCRIT: 40.2 % (ref 39.0–52.0)
HEMOGLOBIN: 13.4 g/dL (ref 13.0–17.0)
LYMPHS ABS: 0.5 10*3/uL — AB (ref 0.7–4.0)
LYMPHS PCT: 4 %
MCH: 32.5 pg (ref 26.0–34.0)
MCHC: 33.3 g/dL (ref 30.0–36.0)
MCV: 97.6 fL (ref 78.0–100.0)
MONOS PCT: 3 %
Monocytes Absolute: 0.4 10*3/uL (ref 0.1–1.0)
NEUTROS ABS: 11.2 10*3/uL — AB (ref 1.7–7.7)
NEUTROS PCT: 93 %
Platelets: 188 10*3/uL (ref 150–400)
RBC: 4.12 MIL/uL — ABNORMAL LOW (ref 4.22–5.81)
RDW: 14.4 % (ref 11.5–15.5)
WBC: 12.1 10*3/uL — ABNORMAL HIGH (ref 4.0–10.5)

## 2015-12-22 LAB — BASIC METABOLIC PANEL
ANION GAP: 14 (ref 5–15)
BUN: 31 mg/dL — ABNORMAL HIGH (ref 6–20)
CHLORIDE: 103 mmol/L (ref 101–111)
CO2: 21 mmol/L — AB (ref 22–32)
Calcium: 8.4 mg/dL — ABNORMAL LOW (ref 8.9–10.3)
Creatinine, Ser: 1.32 mg/dL — ABNORMAL HIGH (ref 0.61–1.24)
GFR calc non Af Amer: 46 mL/min — ABNORMAL LOW (ref >60)
GFR, EST AFRICAN AMERICAN: 53 mL/min — AB (ref >60)
Glucose, Bld: 172 mg/dL — ABNORMAL HIGH (ref 65–99)
Potassium: 4.3 mmol/L (ref 3.5–5.1)
Sodium: 138 mmol/L (ref 135–145)

## 2015-12-22 LAB — TROPONIN I
Troponin I: 0.07 ng/mL — ABNORMAL HIGH (ref <0.031)
Troponin I: 0.06 ng/mL — ABNORMAL HIGH (ref <0.031)
Troponin I: 0.05 ng/mL — ABNORMAL HIGH (ref <0.031)

## 2015-12-22 LAB — EXPECTORATED SPUTUM ASSESSMENT W REFEX TO RESP CULTURE

## 2015-12-22 LAB — EXPECTORATED SPUTUM ASSESSMENT W GRAM STAIN, RFLX TO RESP C

## 2015-12-22 LAB — INFLUENZA PANEL BY PCR (TYPE A & B)
H1N1FLUPCR: NOT DETECTED
Influenza A By PCR: NEGATIVE
Influenza B By PCR: NEGATIVE

## 2015-12-22 LAB — MRSA PCR SCREENING: MRSA by PCR: NEGATIVE

## 2015-12-22 LAB — LACTIC ACID, PLASMA
LACTIC ACID, VENOUS: 3.2 mmol/L — AB (ref 0.5–2.0)
LACTIC ACID, VENOUS: 2.7 mmol/L — AB (ref 0.5–2.0)
Lactic Acid, Venous: 2.5 mmol/L (ref 0.5–2.0)

## 2015-12-22 LAB — STREP PNEUMONIAE URINARY ANTIGEN: Strep Pneumo Urinary Antigen: NEGATIVE

## 2015-12-22 LAB — DIGOXIN LEVEL: DIGOXIN LVL: 0.5 ng/mL — AB (ref 0.8–2.0)

## 2015-12-22 LAB — PROCALCITONIN: Procalcitonin: 4.5 ng/mL

## 2015-12-22 LAB — TSH: TSH: 0.728 u[IU]/mL (ref 0.350–4.500)

## 2015-12-22 MED ORDER — CEFTRIAXONE SODIUM 1 G IJ SOLR
1.0000 g | INTRAMUSCULAR | Status: DC
  Administered 2015-12-22 – 2015-12-24 (×3): 1 g via INTRAVENOUS
  Filled 2015-12-22 (×3): qty 10

## 2015-12-22 MED ORDER — SODIUM CHLORIDE 0.9 % IV SOLN
INTRAVENOUS | Status: AC
  Administered 2015-12-22: 05:00:00 via INTRAVENOUS

## 2015-12-22 MED ORDER — CETYLPYRIDINIUM CHLORIDE 0.05 % MT LIQD
7.0000 mL | Freq: Two times a day (BID) | OROMUCOSAL | Status: DC
  Administered 2015-12-23 – 2015-12-24 (×3): 7 mL via OROMUCOSAL

## 2015-12-22 MED ORDER — SODIUM CHLORIDE 0.9 % IV BOLUS (SEPSIS)
500.0000 mL | Freq: Once | INTRAVENOUS | Status: AC
  Administered 2015-12-22: 500 mL via INTRAVENOUS

## 2015-12-22 MED ORDER — METOPROLOL TARTRATE 12.5 MG HALF TABLET
12.5000 mg | ORAL_TABLET | Freq: Two times a day (BID) | ORAL | Status: DC
  Administered 2015-12-22 – 2015-12-24 (×4): 12.5 mg via ORAL
  Filled 2015-12-22 (×4): qty 1

## 2015-12-22 MED ORDER — DEXTROSE 5 % IV SOLN
250.0000 mg | INTRAVENOUS | Status: DC
  Administered 2015-12-22 – 2015-12-24 (×3): 250 mg via INTRAVENOUS
  Filled 2015-12-22 (×5): qty 250

## 2015-12-22 MED ORDER — METOPROLOL TARTRATE 12.5 MG HALF TABLET
12.5000 mg | ORAL_TABLET | Freq: Once | ORAL | Status: AC
  Administered 2015-12-22: 12.5 mg via ORAL
  Filled 2015-12-22: qty 1

## 2015-12-22 NOTE — ED Notes (Signed)
Pt's spo2 at 84% on 4L [place], Dr Redmond Pulling notified and stated to put pt back on BiPap. Respiratory notified and in room now to place pt back on bipap

## 2015-12-22 NOTE — Progress Notes (Signed)
PT Cancellation Note  Patient Details Name: Logan [name] MRN: [id number] DOB: [birth year]   Cancelled Treatment:    Reason Eval/Treat Not Completed: Medical issues which prohibited therapy (HR fluctuating between 39-120 bpm; Runs VTach. MD aware.)Spoke with Festus Holts, Nursing who states to HOLD today and check back tomorrow.  Will return when able.  Thanks.    Irwin Brakeman F 12/22/2015, 3:05 PM [facility] [facility] [phone] (pager)

## 2015-12-22 NOTE — Consult Note (Signed)
Requesting provider: Dr Vernelle Emerald. Primary cardiologist: Dr Peter Martinique Consulting cardiologist: Dr Carlyle Dolly MD  Reason for consultation: wide complex tachycardia  Clinical Summary Mr. Zenisek is a 82 y.o.male hstory of permanent afib, chronic diastolic heart failure, history of PVCs, COPD hypothyroidism admitted with SOB. Diagnosed with sepsis secondary to respiratory infection, metabolic/lactic acidosis, and COPD exacerbation managed by primary team. Cardiology is consulted for afib and frequent PVCs. Patient denies any palpitations, no chest pain.   -INR 3.8, Hgb 14.8, WBC 17.2, trop 0.05-->0.07-->0.06, BNP 163, K 4.5, Cr 1.28 (1 year ago 0.8), BUN 29, CO2 20, lactic acid 3, digoxin 0.5, ABG 7.36/43/77/25.  - CXR alveolar opacities - EKG low voltage, afib with RVR, PVCs - echo Jan 2015: LVEF 50-55%, cannot eval diastolic function, PASP 62    No Known Allergies  Medications Scheduled Medications: . aspirin EC  81 mg Oral Daily  . atorvastatin  40 mg Oral q1800  . azithromycin  250 mg Intravenous Q24H  . cefTRIAXone (ROCEPHIN)  IV  1 g Intravenous Q24H  . ipratropium-albuterol  3 mL Nebulization Q6H  . levothyroxine  75 mcg Oral QAC breakfast  . multivitamin with minerals  1 tablet Oral Daily  . predniSONE  40 mg Oral Q breakfast  . sodium chloride flush  3 mL Intravenous Q12H  . tamsulosin  0.4 mg Oral QPC breakfast  . Warfarin - Pharmacist Dosing Inpatient   Does not apply q1800     Infusions:     PRN Medications:  acetaminophen **OR** acetaminophen, guaiFENesin, sodium chloride flush   Past Medical History  Diagnosis Date  . Chronic atrial fibrillation ([place])   . Dilated cardiomyopathy ([place])   . PVC's (premature ventricular contractions)   . Hyperlipidemia   . COPD (chronic obstructive pulmonary disease) ([place])   . Hypothyroidism   . Renal calculi   . Chronic anticoagulation   . CHF (congestive heart failure) ([facility])     DUE TO SYSTOLIC  DYSFUNCTION  . Shortness of breath     exertional  . Cancer (H[facility])     vocal cord - radiation     Past Surgical History  Procedure Laterality Date  . Cardiac catheterization  02/26/2007    EF 35-40%  . Appendectomy    . Tonsillectomy and adenoidectomy    . US echocardiography  07/08/2010    EF 50-55%  . US echocardiography  02/16/2007    EF 25-35%  . Transthoracic echocardiogram  08/26/2001    EF 25-30%  . Cardiovascular stress test  02/18/2007    EF 40%  . Microlaryngoscopy with co2 laser and excision of vocal cord lesion      15 years ago  . Lithotripsy    . Inguinal hernia repair Right 12/28/2012    Procedure: HERNIA REPAIR INGUINAL ADULT;  Surgeon: Joyice Faster. Cornett, MD;  Location: [facility] OR;  Service: General;  Laterality: Right;    Family History  Problem Relation Age of Onset  . Stroke Mother   . Seizures Mother   . Heart attack Father   . Heart failure Father   . Heart attack Brother     Social History Mr. Longbrake reports that he quit smoking about 54 years ago. His smoking use included Cigarettes. He has a 35 pack-year smoking history. He does not have any smokeless tobacco history on file. Mr. Gromek reports that he does not drink alcohol.  Review of Systems Complete review of systems negative except as otherwise outlined in the clinical summary  and also the following.  Physical Examination Blood pressure 101/61, pulse 115, temperature 97.2 F (36.2 C), temperature source Oral, resp. rate 20, height 5\' 9"  (1.753 m), weight 126 lb 1.7 oz (57.2 kg), SpO2 93 %.  Intake/Output Summary (Last 24 hours) at 12/22/15 1504 Last data filed at 12/22/15 1200  Gross per 24 hour  Intake   2286 ml  Output    500 ml  Net   1786 ml   Gen: NAD HEENT: sclera clear, throat clear CV: irreg, rate 110, no m/r/g Pulm: coarse bilaterally Abd: Soft, NT, ND Ext: no LE edema Neuro: no focal deficits Skin: no rash Psych: appropriate affect   Lab Results  Basic Metabolic  Panel:  Recent Labs Lab 12/21/15 1633 12/22/15 0320  NA 136 138  K 4.5 4.3  CL 98* 103  CO2 20* 21*  GLUCOSE 170* 172*  BUN 29* 31*  CREATININE 1.28* 1.32*  CALCIUM 8.9 8.4*    Liver Function Tests:  Recent Labs Lab 12/21/15 1633  AST 41  ALT 20  ALKPHOS 65  BILITOT 0.7  PROT 6.8  ALBUMIN 3.3*    CBC:  Recent Labs Lab 12/21/15 1633 12/22/15 0320  WBC 17.2* 12.1*  NEUTROABS 14.9* 11.2*  HGB 14.8 13.4  HCT 45.9 40.2  MCV 97.5 97.6  PLT 234 188    Cardiac Enzymes:  Recent Labs Lab 12/21/15 1633 12/21/15 2313 12/22/15 0320 12/22/15 1041  TROPONINI 0.05* 0.07* 0.06* 0.05*    BNP: Invalid input(s): POCBNP    Impression/Recommendations 1. Sepsis secondary to respiratory infection with lactic acidosis - management per primary team  2. COPD exacerbation - management per primary team  3. Afib - presented with afib with RVR - cannot find full details of his afib history but he is listed as having permanent afib - Mg pending, TSH pending - last cardiology clinic note mentions being rate controlled with metoprolol and digoxin. H&P mentions home control with coreg and digoxin. His documented home med rec does not show a beta blocker. He himself does not recall what he was taking at home. He currnently is only on digoxin - given his age and stage II CKD would lean toward getting him off digoxin if possible. Baseline bp appears to be low 100s/60s, so unlikely to tolerate aggressive beta blocker or CCB dosing. Will start lopressor 12.5mg  bid and discontinue digoxin.   4. PVCs - frequent PVCs on tele, patient is asymptomatic. Primiarly isolated and couplets, longest run 5 beats. - low heart rates by dynamap likely due to nondetected PVCs, do not see any corresponding bradycardia by telemetry - PVCs in setting of acute illness, likely partially catecholamine driven - start beta blocker, repeat echo as he has a prior history of LV systolic dysfunction that  normalized. F/u Mg and TSH.    4. AKI - per primary team.    Carlyle Dolly MD

## 2015-12-22 NOTE — ED Notes (Signed)
Pt tolerating venturi mask well, Spo2 staying in the mid 90s

## 2015-12-22 NOTE — Progress Notes (Signed)
ANTICOAGULATION CONSULT NOTE - Follow-up Consult  Pharmacy Consult for Warfarin Indication: atrial fibrillation  No Known Allergies  Patient Measurements: Height: 5\' 9"  (175.3 cm) Weight: 126 lb 1.7 oz (57.2 kg) IBW/kg (Calculated) : 70.7   Vital Signs: Temp: 97.5 F (36.4 C) (03/04 0749) Temp Source: Oral (03/04 0749) BP: 101/58 mmHg (03/04 0500) Pulse Rate: 105 (03/04 0500)  Labs:  Recent Labs  12/21/15 1633 12/21/15 2313 12/22/15 0320  HGB 14.8  --  13.4  HCT 45.9  --  40.2  PLT 234  --  188  LABPROT 36.8*  --  47.7*  INR 3.83*  --  5.37*  CREATININE 1.28*  --  1.32*  TROPONINI 0.05* 0.07* 0.06*    Estimated Creatinine Clearance: 30.7 mL/min (by C-G formula based on Cr of 1.32).   Medical History: Past Medical History  Diagnosis Date  . Chronic atrial fibrillation ([place])   . Dilated cardiomyopathy ([place])   . PVC's (premature ventricular contractions)   . Hyperlipidemia   . COPD (chronic obstructive pulmonary disease) ([place])   . Hypothyroidism   . Renal calculi   . Chronic anticoagulation   . CHF (congestive heart failure) ([facility])     DUE TO SYSTOLIC DYSFUNCTION  . Shortness of breath     exertional  . Cancer ([facility])     vocal cord - radiation     Medications:  Prescriptions prior to admission  Medication Sig Dispense Refill Last Dose  . ANORO ELLIPTA 62.5-25 MCG/INH AEPB INHALE 1 PUFF INTO THE LUNGS DAILY. 60 each 11   . aspirin 81 MG tablet Take 81 mg by mouth daily.   Taking  . Calcium-Magnesium-Zinc (CAL-MAG-ZINC PO) Take 1 tablet by mouth daily.    Taking  . dextromethorphan-guaiFENesin (MUCINEX DM) 30-600 MG per 12 hr tablet Take 1-2 tablets by mouth every 12 (twelve) hours as needed (cough, congestion).   Taking  . digoxin (LANOXIN) 0.125 MG tablet Take 1 tablet (0.125 mg total) by mouth daily. 30 tablet 11   . furosemide (LASIX) 40 MG tablet Take 40 mg by mouth daily. May take 1 extra daily as needed for leg swelling   Taking  . levothyroxine  (SYNTHROID, LEVOTHROID) 75 MCG tablet Take 1 tablet (75 mcg total) by mouth daily. 30 tablet 2   . losartan (COZAAR) 25 MG tablet Take 25 mg by mouth daily.    Taking  . Multiple Vitamin (MULTIVITAMIN) tablet Take 1 tablet by mouth daily.    Taking  . PROAIR HFA 108 (90 BASE) MCG/ACT inhaler Inhale 2 puffs into the lungs every 4 (four) hours as needed for wheezing or shortness of breath.    Taking  . simvastatin (ZOCOR) 80 MG tablet Take 1 tablet (80 mg total) by mouth at bedtime. 30 tablet 9 Taking  . tamsulosin (FLOMAX) 0.4 MG CAPS capsule Take 0.4 mg by mouth daily after breakfast.    Taking  . warfarin (COUMADIN) 3 MG tablet Take as directed by coumadin clinic (Patient taking differently: Take as directed by coumadin clinic Takes 3mg  Sun and 4.5mg  all other days) 135 tablet 1 12/21/2015   Scheduled:  . aspirin EC  81 mg Oral Daily  . atorvastatin  40 mg Oral q1800  . ipratropium-albuterol  3 mL Nebulization Q6H  . levothyroxine  75 mcg Oral QAC breakfast  . multivitamin with minerals  1 tablet Oral Daily  . predniSONE  40 mg Oral Q breakfast  . sodium chloride flush  3 mL Intravenous Q12H  . tamsulosin  0.4 mg Oral QPC breakfast  . Warfarin - Pharmacist Dosing Inpatient   Does not apply q1800   Infusions:  . sodium chloride Stopped (12/22/15 0530)    Assessment: 82yo male with history of Afib on warfarin PTA presents with N/V/D and worsening SOB. Pharmacy is consulted to dose warfarin for atrial fibrillation. INR 3.83 on admit, CBC wnl  INR today 5.37, no bleeding per RN.   PTA Warfarin Dose: 3mg  Sun and 4.5mg  AODs with last dose 3/3  Goal of Therapy:  INR 2-3 Monitor platelets by anticoagulation protocol: Yes   Plan:  Hold warfarin tonight Daily INR Monitor s/sx of bleeding  Stalin Gruenberg C. Lennox Grumbles, PharmD Pharmacy Resident  Pager: [phone] 12/22/2015 9:37 AM

## 2015-12-22 NOTE — ED Notes (Signed)
Lab called to inform this RN that the pt's Lactic Acid is 2.5

## 2015-12-22 NOTE — Progress Notes (Signed)
Patient having frequent runs of non sustained v tach -5 beats. Patient is asymptomatic. Dr. Benjamine Mola made aware cardiology consult pending.

## 2015-12-22 NOTE — ED Notes (Signed)
Dr Redmond Pulling gave verbal order to put pt on 4L [place] and to try to keep pt's spo2 between 88%-92% with the [place]. If pt falls below 88% put the venturi back on and notify MD.

## 2015-12-22 NOTE — Progress Notes (Signed)
Placed patient on nasal cannula set at 3lpm for transport. Patient remained stable on cannula with Sp02=96-99% and no signs of respiratory distress. Patient stated he was breathing fine. Will continue to monitor patient and provide Bipap if patient shows any signs of respiratory distress.

## 2015-12-22 NOTE — Progress Notes (Signed)
Subjective: Patient was on BiPap part of overnight but resting comfortably on nasal cannula this morning. He feels near his baselnie and thinks dyspnea and work of breathing are better now. He has continued to be in Afib at a rate in the 100-110s with a period of asymptomatic bradycardia noted.  Objective: Vital signs in last 24 hours: Filed Vitals:   12/22/15 0749 12/22/15 0800 12/22/15 1141 12/22/15 1200  BP:  103/64  101/61  Pulse:  52  115  Temp: 97.5 F (36.4 C)  97.2 F (36.2 C)   TempSrc: Oral  Oral   Resp:  28  20  Height:      Weight:      SpO2:  98%  93%   Weight change:   Intake/Output Summary (Last 24 hours) at 12/22/15 1355 Last data filed at 12/22/15 1200  Gross per 24 hour  Intake   2286 ml  Output    500 ml  Net   1786 ml   GENERAL- Chronically ill, thin, elderly man in NAD HEENT- Atraumatic, PERRL, oral mucosa appears dry, no cervical LN enlargement. CARDIAC- tachycardic and irregular RESP- Loud upper airway noises, fair air movement throughout and in both bases with  coarse breath sounds ABDOMEN- Soft, nontender, no guarding or rebound, normoactive bowel sounds present EXTREMITIES- symmetric, no pedal edema. SKIN- Warm, dry, No rash or lesion. PSYCH- Normal mood and affect, appropriate thought content and speech.   Lab Results: Basic Metabolic Panel:  Recent Labs Lab 12/21/15 1633 12/22/15 0320  NA 136 138  K 4.5 4.3  CL 98* 103  CO2 20* 21*  GLUCOSE 170* 172*  BUN 29* 31*  CREATININE 1.28* 1.32*  CALCIUM 8.9 8.4*   Liver Function Tests:  Recent Labs Lab 12/21/15 1633  AST 41  ALT 20  ALKPHOS 65  BILITOT 0.7  PROT 6.8  ALBUMIN 3.3*    Recent Labs Lab 12/21/15 1633  LIPASE 20   No results for input(s): AMMONIA in the last 168 hours. CBC:  Recent Labs Lab 12/21/15 1633 12/22/15 0320  WBC 17.2* 12.1*  NEUTROABS 14.9* 11.2*  HGB 14.8 13.4  HCT 45.9 40.2  MCV 97.5 97.6  PLT 234 188   Cardiac Enzymes:  Recent  Labs Lab 12/21/15 2313 12/22/15 0320 12/22/15 1041  TROPONINI 0.07* 0.06* 0.05*   BNP: No results for input(s): PROBNP in the last 168 hours. D-Dimer: No results for input(s): DDIMER in the last 168 hours. CBG: No results for input(s): GLUCAP in the last 168 hours. Hemoglobin A1C: No results for input(s): HGBA1C in the last 168 hours. Fasting Lipid Panel: No results for input(s): CHOL, HDL, LDLCALC, TRIG, CHOLHDL, LDLDIRECT in the last 168 hours. Thyroid Function Tests: No results for input(s): TSH, T4TOTAL, FREET4, T3FREE, THYROIDAB in the last 168 hours. Coagulation:  Recent Labs Lab 12/21/15 1633 12/22/15 0320  LABPROT 36.8* 47.7*  INR 3.83* 5.37*   Anemia Panel: No results for input(s): VITAMINB12, FOLATE, FERRITIN, TIBC, IRON, RETICCTPCT in the last 168 hours. Urine Drug Screen: Drugs of Abuse  No results found for: LABOPIA, COCAINSCRNUR, LABBENZ, AMPHETMU, THCU, LABBARB  Alcohol Level: No results for input(s): ETH in the last 168 hours. Urinalysis:  Recent Labs Lab 12/21/15 2026  COLORURINE YELLOW  LABSPEC >1.030*  PHURINE 6.0  GLUCOSEU NEGATIVE  HGBUR NEGATIVE  BILIRUBINUR NEGATIVE  KETONESUR NEGATIVE  PROTEINUR NEGATIVE  NITRITE NEGATIVE  LEUKOCYTESUR NEGATIVE   Micro Results: Recent Results (from the past 240 hour(s))  Culture, blood (routine x 2)  Status: None (Preliminary result)   Collection Time: 12/21/15  4:33 PM  Result Value Ref Range Status   Specimen Description BLOOD RIGHT FOREARM  Final   Special Requests BOTTLES DRAWN AEROBIC AND ANAEROBIC 5CC  Final   Culture NO GROWTH < 24 HOURS  Final   Report Status PENDING  Incomplete  MRSA PCR Screening     Status: None   Collection Time: 12/22/15  5:25 AM  Result Value Ref Range Status   MRSA by PCR NEGATIVE NEGATIVE Final    Comment:        The GeneXpert MRSA Assay (FDA approved for NASAL specimens only), is one component of a comprehensive MRSA colonization surveillance program. It  is not intended to diagnose MRSA infection nor to guide or monitor treatment for MRSA infections.   Culture, expectorated sputum-assessment     Status: None   Collection Time: 12/22/15  8:16 AM  Result Value Ref Range Status   Specimen Description SPUTUM  Final   Special Requests NONE  Final   Sputum evaluation   Final    THIS SPECIMEN IS ACCEPTABLE. RESPIRATORY CULTURE REPORT TO FOLLOW.   Report Status 12/22/2015 FINAL  Final   Studies/Results: Dg Chest Port 1 View  12/21/2015  CLINICAL DATA:  Acute shortness of Breath EXAM: PORTABLE CHEST 1 VIEW COMPARISON:  11/29/2014 FINDINGS: Mild bilateral interstitial and alveolar opacities, left greater than right, improved somewhat since prior study, more so on the right. Suspect small effusions. Heart is upper limits normal in size. Underlying COPD/hyperinflation. IMPRESSION: Improving interstitial and alveolar opacities with mild persistent asymmetric edema pattern, left greater than right. Suspect small effusions. Electronically Signed   By: Rolm Baptise M.D.   On: 12/21/2015 16:55   Medications: I have reviewed the patient's current medications. Scheduled Meds: . aspirin EC  81 mg Oral Daily  . atorvastatin  40 mg Oral q1800  . azithromycin  250 mg Intravenous Q24H  . cefTRIAXone (ROCEPHIN)  IV  1 g Intravenous Q24H  . ipratropium-albuterol  3 mL Nebulization Q6H  . levothyroxine  75 mcg Oral QAC breakfast  . multivitamin with minerals  1 tablet Oral Daily  . predniSONE  40 mg Oral Q breakfast  . sodium chloride flush  3 mL Intravenous Q12H  . tamsulosin  0.4 mg Oral QPC breakfast  . Warfarin - Pharmacist Dosing Inpatient   Does not apply q1800   Continuous Infusions:  PRN Meds:.acetaminophen **OR** acetaminophen, guaiFENesin, sodium chloride flush Assessment/Plan: Acute hypoxic respiratory failure 2/2 pneumonia: Patient has a natural history very consistent for a viral pneumonia. However PCT positive and significant leukocytosis too  early to be well explained by steroids, with predominant neutrophils. Flu negative. Exacerbating his underlying GOLD stage IV COPD. Does not seem to be from his diastolic heart failure since no edema, weight gain, was taking his medications. However he was ntoed to have some JVD, some vascular congestion on CXR. -Prednisone 40 mg PO, duonebs -Guaifenesin prn -Telemetry -BMP and CBC in am -PT/OT eval and treat -Maintain O2 sats 88-92% -BiPAP prn -Follow up blood cultures, sputum cultures -Strep pneum and legionella urine Ags  Elevated Troponin: Trop 0.05 -> 0.07 -> 0.05. He is in persistent rate controlled Afib with many PVCs. Asymptomatic and this is his baseline outpt rhythm. NTD.  AKI: Cr 1.28->1.32 today up from baseline 0.8 in February. Likely prerenal 2/2 diarrhea. Diarrhea improving. Will hold his home dose Lasix and monitor.  -Hold lasix  -Replace fluids gently 500+75/hrx5 -BMP in am  Chronic A. Fib: On warfarin, coreg and digoxin at home. Metoprolol noted on 12/2014 cardiology note but not on home med list. HR 150-160s initially. Improved to 100-110. Episode of 40s-60s. Currently supertherapeutic. -Warfarin per pharmacy  -Daily INR -repeat EKG -Hold digoxin  Diastolic CHF with dilated cardiomyopathy: In 2008 ejection fraction was 25-35%. Repeat echocardiogram in Jan 2015 showed EF of 50-55%. -losartan 25 mg daily -holding lasix per above -Can add beta blockade as needed for  Pulmonary nodules: 2014 CT noting suspicious for bronchogenic carcinoma nodules that were never followed up despite radiology recs for 6-46months. Needs discussion about rescanning particularly if failing to improve  Hypothyroidism: Stable, continue home levothyroxine 75 HLD: Stable, continue home simvastatin  Dispo: Disposition is deferred at this time, awaiting improvement of current medical problems. Anticipated discharge in approximately 1-2 day(s).   The patient does have a current PCP Redmond Pulling  Arna Medici, MD) and does need an [facility] hospital follow-up appointment after discharge.  The patient does not have transportation limitations that hinder transportation to clinic appointments.   LOS: 1 day   Collier Salina, MD 12/22/2015, 1:55 PM

## 2015-12-22 NOTE — Progress Notes (Signed)
Patients heart rate 40-50's remains in atrial fib with frequent pvc's denies any pain BP 101/61. Nurse assessed heart rate for 43min with a rate of 65. Dr. Benjamine Mola paged and made aware. Dr. Benjamine Mola to come to unit to assess patient.

## 2015-12-22 NOTE — ED Notes (Signed)
Lab called and pt's INR is 5.37

## 2015-12-23 ENCOUNTER — Inpatient Hospital Stay (HOSPITAL_COMMUNITY): Payer: Medicare Other

## 2015-12-23 DIAGNOSIS — J189 Pneumonia, unspecified organism: Secondary | ICD-10-CM

## 2015-12-23 DIAGNOSIS — J9601 Acute respiratory failure with hypoxia: Secondary | ICD-10-CM

## 2015-12-23 LAB — BASIC METABOLIC PANEL
Anion gap: 7 (ref 5–15)
BUN: 33 mg/dL — AB (ref 6–20)
CHLORIDE: 103 mmol/L (ref 101–111)
CO2: 28 mmol/L (ref 22–32)
CREATININE: 0.95 mg/dL (ref 0.61–1.24)
Calcium: 8.6 mg/dL — ABNORMAL LOW (ref 8.9–10.3)
GFR calc Af Amer: >60 mL/min (ref >60)
GFR calc non Af Amer: >60 mL/min (ref >60)
GLUCOSE: 130 mg/dL — AB (ref 65–99)
Potassium: 4.3 mmol/L (ref 3.5–5.1)
SODIUM: 138 mmol/L (ref 135–145)

## 2015-12-23 LAB — CBC
HEMATOCRIT: 36.8 % — AB (ref 39.0–52.0)
Hemoglobin: 12.1 g/dL — ABNORMAL LOW (ref 13.0–17.0)
MCH: 31.7 pg (ref 26.0–34.0)
MCHC: 32.9 g/dL (ref 30.0–36.0)
MCV: 96.3 fL (ref 78.0–100.0)
PLATELETS: 185 10*3/uL (ref 150–400)
RBC: 3.82 MIL/uL — ABNORMAL LOW (ref 4.22–5.81)
RDW: 14.5 % (ref 11.5–15.5)
WBC: 10.5 10*3/uL (ref 4.0–10.5)

## 2015-12-23 LAB — PROTIME-INR
INR: 5.78 (ref 0.00–1.49)
Prothrombin Time: 50.1 seconds — ABNORMAL HIGH (ref 11.6–15.2)

## 2015-12-23 MED ORDER — DIGOXIN 125 MCG PO TABS: 0.1250 mg | ORAL_TABLET | Freq: Every day | ORAL | Status: DC

## 2015-12-23 NOTE — Evaluation (Signed)
Physical Therapy Evaluation Patient Details Name: Logan [name] MRN: [id number] DOB: 1925/01/28 Today's Date: 12/23/2015   History of Present Illness  [name] is a 82 y.o.male hstory of permanent afib, chronic diastolic heart failure, history of PVCs, COPD hypothyroidism admitted with SOB. Diagnosed with sepsis secondary to respiratory infection, metabolic/lactic acidosis, and COPD exacerbation  Clinical Impression   Pt admitted with above diagnosis. Pt currently with functional limitations due to the deficits listed below (see PT Problem List). Remained tachycardic throughout walk; typically ranging 120-140s, but noted 161 observed highest; Pt will benefit from skilled PT to increase their independence and safety with mobility to allow discharge to the venue listed below.       Follow Up Recommendations Home health PT    Equipment Recommendations  3in1 (PT)    Recommendations for Other Services OT consult     Precautions / Restrictions Precautions Precautions: Fall Precaution Comments: wathc HR      Mobility  Bed Mobility Overal bed mobility: Needs Assistance Bed Mobility: Supine to Sit     Supine to sit: Supervision     General bed mobility comments: Supervision mostly for lines and leads; not much difficulty noted  Transfers Overall transfer level: Needs assistance Equipment used: Rolling walker (2 wheeled) Transfers: Sit to/from Stand Sit to Stand: Min guard         General transfer comment: Minguard assist to steady  Ambulation/Gait Ambulation/Gait assistance: Min guard Ambulation Distance (Feet): 80 Feet Assistive device: Rolling walker (2 wheeled) Gait Pattern/deviations: Step-through pattern;Decreased stride length     General Gait Details: Cues to self-monitor for activity tolerance: HR incr to 161 during amb; pt asymptomatic for dizziness  Stairs            Wheelchair Mobility    Modified Rankin (Stroke Patients Only)        Balance Overall balance assessment: Needs assistance   Sitting balance-Leahy Scale: Good       Standing balance-Leahy Scale: Fair                               Pertinent Vitals/Pain Pain Assessment: No/denies pain    Home Living Family/patient expects to be discharged to:: Private residence Living Arrangements: Spouse/significant other Available Help at Discharge: Family;Available 24 hours/day Type of Home: House Home Access: Stairs to enter Entrance Stairs-Rails: Psychiatric nurse of Steps: 5 Home Layout: One level Home Equipment: Walker - 2 wheels;Shower seat      Prior Function Level of Independence: Independent         Comments: I believe he is on home O2, but not exactly sure     Hand Dominance        Extremity/Trunk Assessment   Upper Extremity Assessment: Overall WFL for tasks assessed           Lower Extremity Assessment: Generalized weakness         Communication   Communication: No difficulties  Cognition Arousal/Alertness: Awake/alert Behavior During Therapy: WFL for tasks assessed/performed Overall Cognitive Status: Within Functional Limits for tasks assessed                      General Comments      Exercises        Assessment/Plan    PT Assessment Patient needs continued PT services  PT Diagnosis Difficulty walking;Generalized weakness   PT Problem List Decreased strength;Decreased activity tolerance;Decreased balance;Decreased mobility;Decreased knowledge of use of  DME;Cardiopulmonary status limiting activity  PT Treatment Interventions DME instruction;Gait training;Stair training;Functional mobility training;Therapeutic activities;Therapeutic exercise;Balance training;Cognitive remediation;Patient/family education   PT Goals (Current goals can be found in the Care Plan section) Acute Rehab PT Goals Patient Stated Goal: Hopes to go home soon Time For Goal Achievement:  01/06/16 Potential to Achieve Goals: Good    Frequency Min 3X/week   Barriers to discharge        Co-evaluation               End of Session Equipment Utilized During Treatment: Oxygen Activity Tolerance: Patient tolerated treatment well Patient left: in chair;with call bell/phone within reach Nurse Communication: Mobility status         Time: [id number] PT Time Calculation (min) (ACUTE ONLY): 30 min   Charges:   PT Evaluation $PT Eval Moderate Complexity: 1 Procedure PT Treatments $Gait Training: 8-22 mins   PT G Codes:        Logan [name] 12/23/2015, 2:58 PM  Logan [name], [place] Pager [phone] Office [phone]

## 2015-12-23 NOTE — Progress Notes (Signed)
  Date: 12/23/2015  Patient name: Logan [name]  Medical record number: [id number]  Date of birth: Feb 26, 1925   This patient has been seen and the plan of care was discussed with the house staff. Please see their note for complete details. I concur with their findings with the following additions/corrections:   Patient much improved, currently on rocephin and azithromycin for presumed CAP.  He has an elevated INR and is getting a TTE, hopefully today.  He was started on a beta blocker and digoxin was stopped.  Cardiology is following.    Likely discharge in 1-2 days.  Clinically he is much improved and can likely be transferred to a telemetry bed.    Sid Falcon, MD 12/23/2015, 9:22 AM

## 2015-12-23 NOTE — Progress Notes (Signed)
Attempted to call report, nurse is currently on break and will return the call.

## 2015-12-23 NOTE — Progress Notes (Signed)
Subjective: Denies any complaints, states he is a happy person.   Objective: Vital signs in last 24 hours: Filed Vitals:   12/23/15 0610 12/23/15 0700 12/23/15 0749 12/23/15 0800  BP:    101/62  Pulse: 89 110  77  Temp:   97.9 F (36.6 C)   TempSrc:   Oral   Resp: 16 25  17   Height:      Weight:      SpO2: 92% 89%  93%   Weight change: 4 lb 10.1 oz (2.1 kg)  Intake/Output Summary (Last 24 hours) at 12/23/15 0826 Last data filed at 12/23/15 0751  Gross per 24 hour  Intake   1595 ml  Output   1550 ml  Net     45 ml   GENERAL-pleasant, thin, elderly man in NAD HEENT- Atraumatic, PERRL, oral mucosa appears dry, no cervical LN enlargement. CARDIAC- tachycardic and irregular RESP- Loud upper airway noises, good air movement, no wheezing ABDOMEN- Soft, nontender, no guarding or rebound, normoactive bowel sounds present EXTREMITIES- symmetric, no pedal edema. SKIN- Warm, dry, No rash or lesion. PSYCH- Normal mood and affect, appropriate thought content and speech.   Lab Results: Basic Metabolic Panel:  Recent Labs Lab 12/22/15 0320 12/22/15 1548 12/23/15 0311  NA 138  --  138  K 4.3  --  4.3  CL 103  --  103  CO2 21*  --  28  GLUCOSE 172*  --  130*  BUN 31*  --  33*  CREATININE 1.32*  --  0.95  CALCIUM 8.4*  --  8.6*  MG  --  2.0  --    Liver Function Tests:  Recent Labs Lab 12/21/15 1633  AST 41  ALT 20  ALKPHOS 65  BILITOT 0.7  PROT 6.8  ALBUMIN 3.3*    Recent Labs Lab 12/21/15 1633  LIPASE 20   CBC:  Recent Labs Lab 12/21/15 1633 12/22/15 0320 12/23/15 0311  WBC 17.2* 12.1* 10.5  NEUTROABS 14.9* 11.2*  --   HGB 14.8 13.4 12.1*  HCT 45.9 40.2 36.8*  MCV 97.5 97.6 96.3  PLT 234 188 185   Cardiac Enzymes:  Recent Labs Lab 12/21/15 2313 12/22/15 0320 12/22/15 1041  TROPONINI 0.07* 0.06* 0.05*    Thyroid Function Tests:  Recent Labs Lab 12/22/15 1555  TSH 0.728   Coagulation:  Recent Labs Lab 12/21/15 1633  12/22/15 0320 12/23/15 0311  LABPROT 36.8* 47.7* 50.1*  INR 3.83* 5.37* 5.78*   Urinalysis:  Recent Labs Lab 12/21/15 2026  COLORURINE YELLOW  LABSPEC >1.030*  PHURINE 6.0  GLUCOSEU NEGATIVE  HGBUR NEGATIVE  BILIRUBINUR NEGATIVE  KETONESUR NEGATIVE  PROTEINUR NEGATIVE  NITRITE NEGATIVE  LEUKOCYTESUR NEGATIVE   Micro Results: Recent Results (from the past 240 hour(s))  Culture, blood (routine x 2)     Status: None (Preliminary result)   Collection Time: 12/21/15  4:33 PM  Result Value Ref Range Status   Specimen Description BLOOD RIGHT FOREARM  Final   Special Requests BOTTLES DRAWN AEROBIC AND ANAEROBIC 5CC  Final   Culture NO GROWTH < 24 HOURS  Final   Report Status PENDING  Incomplete  MRSA PCR Screening     Status: None   Collection Time: 12/22/15  5:25 AM  Result Value Ref Range Status   MRSA by PCR NEGATIVE NEGATIVE Final    Comment:        The GeneXpert MRSA Assay (FDA approved for NASAL specimens only), is one component of a comprehensive MRSA  colonization surveillance program. It is not intended to diagnose MRSA infection nor to guide or monitor treatment for MRSA infections.   Culture, expectorated sputum-assessment     Status: None   Collection Time: 12/22/15  8:16 AM  Result Value Ref Range Status   Specimen Description SPUTUM  Final   Special Requests NONE  Final   Sputum evaluation   Final    THIS SPECIMEN IS ACCEPTABLE. RESPIRATORY CULTURE REPORT TO FOLLOW.   Report Status 12/22/2015 FINAL  Final   Studies/Results: Dg Chest 2 View  12/23/2015  CLINICAL DATA:  Dyspnea, history chronic atrial fibrillation, dilated cardiomyopathy, COPD, CHF cough pulmonary hypertension EXAM: CHEST  2 VIEW COMPARISON:  12/21/2015 FINDINGS: Upper normal size of cardiac silhouette. Atherosclerotic calcification aorta. Mediastinal contours and pulmonary vascularity normal. Emphysematous and bronchitic changes consistent with COPD. Diffuse interstitial prominence in the  mid to lower lungs appears chronic. Superimposed airspace infiltrates particularly in mid inferior LEFT hemi thorax appear improved since previous study. Probable tiny bibasilar effusions. No pneumothorax. Bones demineralized. IMPRESSION: Changes of COPD and chronic interstitial disease with improving superimposed infiltrates particularly in LEFT lung. Electronically Signed   By: Lavonia Dana M.D.   On: 12/23/2015 07:49   Dg Chest Port 1 View  12/21/2015  CLINICAL DATA:  Acute shortness of Breath EXAM: PORTABLE CHEST 1 VIEW COMPARISON:  11/29/2014 FINDINGS: Mild bilateral interstitial and alveolar opacities, left greater than right, improved somewhat since prior study, more so on the right. Suspect small effusions. Heart is upper limits normal in size. Underlying COPD/hyperinflation. IMPRESSION: Improving interstitial and alveolar opacities with mild persistent asymmetric edema pattern, left greater than right. Suspect small effusions. Electronically Signed   By: Rolm Baptise M.D.   On: 12/21/2015 16:55   Medications: I have reviewed the patient's current medications. Scheduled Meds: . antiseptic oral rinse  7 mL Mouth Rinse BID  . aspirin EC  81 mg Oral Daily  . atorvastatin  40 mg Oral q1800  . azithromycin  250 mg Intravenous Q24H  . cefTRIAXone (ROCEPHIN)  IV  1 g Intravenous Q24H  . ipratropium-albuterol  3 mL Nebulization Q6H  . levothyroxine  75 mcg Oral QAC breakfast  . metoprolol tartrate  12.5 mg Oral BID  . multivitamin with minerals  1 tablet Oral Daily  . predniSONE  40 mg Oral Q breakfast  . sodium chloride flush  3 mL Intravenous Q12H  . tamsulosin  0.4 mg Oral QPC breakfast  . Warfarin - Pharmacist Dosing Inpatient   Does not apply q1800   Continuous Infusions:  PRN Meds:.acetaminophen **OR** acetaminophen, guaiFENesin, sodium chloride flush Assessment/Plan:  Acute hypoxic respiratory failure 2/2 pneumonia: CXR this morning + for improving superimposed infiltrates - continue  azithro and rocephin -Prednisone 40 mg PO, duonebs -Guaifenesin prn -PT/OT eval and treat -Maintain O2 sats 88-92% - BiPAP prn - blood cultures, sputum cultures NGTD -Strep pneum negative, legionella urine Ag pending   AKI: resolving, Cr 0.95 today -Hold lasix  - repeat bmet in the am  Chronic A. Fib: On warfarin, coreg and digoxin at home. TSH nl. Mag nl. Currently supertherapeutic INR. - cardiology consulted, appreciate their recommendations - ECHO ordered - lopressor 12.5mg  BID - digoxin stopped -Warfarin per pharmacy  -Daily INR   Diastolic CHF with dilated cardiomyopathy: In 2008 ejection fraction was 25-35%. Repeat echocardiogram in Jan 2015 showed EF of 50-55%. -losartan 25 mg daily -holding lasix per above - lopressor 12.5 mg BID  Pulmonary nodules: 2014 CT noting suspicious for bronchogenic carcinoma  nodules that were never followed up despite radiology recs for 6-32months. Needs discussion about rescanning particularly if failing to improve  Hypothyroidism: Stable, continue home levothyroxine 75 HLD: Stable, continue home simvastatin  Dispo: Disposition is deferred at this time, awaiting improvement of current medical problems. Anticipated discharge in approximately 0-1 day(s).   The patient does have a current PCP Redmond Pulling Arna Medici, MD) and does need an [facility] hospital follow-up appointment after discharge.  The patient does not have transportation limitations that hinder transportation to clinic appointments.   LOS: 2 days   Norman Herrlich, MD 12/23/2015, 8:26 AM

## 2015-12-23 NOTE — Progress Notes (Signed)
ANTICOAGULATION CONSULT NOTE - Follow-up Consult  Pharmacy Consult for Warfarin Indication: atrial fibrillation  No Known Allergies  Patient Measurements: Height: 5\' 9"  (175.3 cm) Weight: 129 lb 10.1 oz (58.8 kg) IBW/kg (Calculated) : 70.7   Vital Signs: Temp: 97.9 F (36.6 C) (03/05 0749) Temp Source: Oral (03/05 0749) BP: 96/54 mmHg (03/05 0400) Pulse Rate: 110 (03/05 0700)  Labs:  Recent Labs  12/21/15 1633 12/21/15 2313 12/22/15 0320 12/22/15 1041 12/23/15 0311  HGB 14.8  --  13.4  --  12.1*  HCT 45.9  --  40.2  --  36.8*  PLT 234  --  188  --  185  LABPROT 36.8*  --  47.7*  --  50.1*  INR 3.83*  --  5.37*  --  5.78*  CREATININE 1.28*  --  1.32*  --  0.95  TROPONINI 0.05* 0.07* 0.06* 0.05*  --     Estimated Creatinine Clearance: 43.8 mL/min (by C-G formula based on Cr of 0.95).   Medical History: Past Medical History  Diagnosis Date  . Chronic atrial fibrillation ([place])   . Dilated cardiomyopathy ([place])   . PVC's (premature ventricular contractions)   . Hyperlipidemia   . COPD (chronic obstructive pulmonary disease) ([place])   . Hypothyroidism   . Renal calculi   . Chronic anticoagulation   . CHF (congestive heart failure) ([facility])     DUE TO SYSTOLIC DYSFUNCTION  . Shortness of breath     exertional  . Cancer ([facility])     vocal cord - radiation     Medications:  Prescriptions prior to admission  Medication Sig Dispense Refill Last Dose  . aspirin 81 MG tablet Take 81 mg by mouth daily.   12/21/2015 at Unknown time  . Calcium-Magnesium-Zinc (CAL-MAG-ZINC PO) Take 1 tablet by mouth daily.    12/21/2015 at Unknown time  . dextromethorphan-guaiFENesin (MUCINEX DM) 30-600 MG per 12 hr tablet Take 1-2 tablets by mouth every 12 (twelve) hours as needed (cough, congestion).   Past Week at Unknown time  . digoxin (LANOXIN) 0.125 MG tablet Take 1 tablet (0.125 mg total) by mouth daily. 30 tablet 11 12/21/2015 at Unknown time  . furosemide (LASIX) 40 MG tablet Take 40 mg  by mouth daily. May take 1 extra daily as needed for leg swelling   12/21/2015 at Unknown time  . levothyroxine (SYNTHROID, LEVOTHROID) 75 MCG tablet Take 1 tablet (75 mcg total) by mouth daily. 30 tablet 2 12/21/2015 at Unknown time  . losartan (COZAAR) 25 MG tablet Take 25 mg by mouth daily.    12/21/2015 at Unknown time  . Multiple Vitamin (MULTIVITAMIN) tablet Take 1 tablet by mouth daily.    12/21/2015 at Unknown time  . PROAIR HFA 108 (90 BASE) MCG/ACT inhaler Inhale 2 puffs into the lungs every 4 (four) hours as needed for wheezing or shortness of breath.    unknown at Unknown time  . simvastatin (ZOCOR) 80 MG tablet Take 1 tablet (80 mg total) by mouth at bedtime. 30 tablet 9 12/21/2015 at Unknown time  . tamsulosin (FLOMAX) 0.4 MG CAPS capsule Take 0.4 mg by mouth daily after breakfast.    12/21/2015 at Unknown time  . umeclidinium-vilanterol (ANORO ELLIPTA) 62.5-25 MCG/INH AEPB Inhale 1 puff into the lungs daily as needed. For shortness of breath per patient   unknown at unknown  . warfarin (COUMADIN) 3 MG tablet Take as directed by coumadin clinic (Patient taking differently: Take as directed by coumadin clinic Takes 3mg  Sun and 4.5mg   all other days) 135 tablet 1 12/21/2015 at Unknown time  . ANORO ELLIPTA 62.5-25 MCG/INH AEPB INHALE 1 PUFF INTO THE LUNGS DAILY. (Patient not taking: Reported on 12/22/2015) 60 each 11 Not Taking at unknown   Scheduled:  . antiseptic oral rinse  7 mL Mouth Rinse BID  . aspirin EC  81 mg Oral Daily  . atorvastatin  40 mg Oral q1800  . azithromycin  250 mg Intravenous Q24H  . cefTRIAXone (ROCEPHIN)  IV  1 g Intravenous Q24H  . ipratropium-albuterol  3 mL Nebulization Q6H  . levothyroxine  75 mcg Oral QAC breakfast  . metoprolol tartrate  12.5 mg Oral BID  . multivitamin with minerals  1 tablet Oral Daily  . predniSONE  40 mg Oral Q breakfast  . sodium chloride flush  3 mL Intravenous Q12H  . tamsulosin  0.4 mg Oral QPC breakfast  . Warfarin - Pharmacist Dosing  Inpatient   Does not apply q1800   Infusions:     Assessment: 82yo male with history of Afib on warfarin PTA presents with N/V/D and worsening SOB. Pharmacy is consulted to dose warfarin for atrial fibrillation. INR 3.83 on admit, CBC wnl  INR today 5.78, no bleeding per RN.   PTA Warfarin Dose: 3mg  Sun and 4.5mg  AODs with last dose 3/3  Goal of Therapy:  INR 2-3 Monitor platelets by anticoagulation protocol: Yes   Plan:  Hold warfarin tonight Daily INR Monitor s/sx of bleeding  Geraldina Parrott C. Lennox Grumbles, PharmD Pharmacy Resident  Pager: [phone] 12/23/2015 7:51 AM

## 2015-12-23 NOTE — Progress Notes (Signed)
Report called to Margaretha Sheffield RN. Patient made aware of transfer. Patient to be transferred to 3E24 via wheelchair.

## 2015-12-23 NOTE — Progress Notes (Signed)
Patient ID: Logan [name], male   DOB: 03-02-1925, 82 y.o.   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 Dyspnea appears improved. Denies fever or chills.  CURRENT MEDS . antiseptic oral rinse  7 mL Mouth Rinse BID  . aspirin EC  81 mg Oral Daily  . atorvastatin  40 mg Oral q1800  . azithromycin  250 mg Intravenous Q24H  . cefTRIAXone (ROCEPHIN)  IV  1 g Intravenous Q24H  . ipratropium-albuterol  3 mL Nebulization Q6H  . levothyroxine  75 mcg Oral QAC breakfast  . metoprolol tartrate  12.5 mg Oral BID  . multivitamin with minerals  1 tablet Oral Daily  . predniSONE  40 mg Oral Q breakfast  . sodium chloride flush  3 mL Intravenous Q12H  . tamsulosin  0.4 mg Oral QPC breakfast  . Warfarin - Pharmacist Dosing Inpatient   Does not apply q1800    OBJECTIVE  Filed Vitals:   12/23/15 0700 12/23/15 0749 12/23/15 0800 12/23/15 0947  BP:   101/62 101/62  Pulse: 110  77 124  Temp:  97.9 F (36.6 C)    TempSrc:  Oral    Resp: 25  17   Height:      Weight:      SpO2: 89%  93%     Intake/Output Summary (Last 24 hours) at 12/23/15 1003 Last data filed at 12/23/15 0915  Gross per 24 hour  Intake   1675 ml  Output   1450 ml  Net    225 ml   Filed Weights   12/21/15 1636 12/22/15 0525 12/23/15 0338  Weight: 125 lb (56.7 kg) 126 lb 1.7 oz (57.2 kg) 129 lb 10.1 oz (58.8 kg)    PHYSICAL EXAM  General: Pleasant, older man, NAD. Neuro: Alert and  oriented X 3. Moves all extremities spontaneously. Psych: Normal affect. HEENT:  Normal  Neck: Supple without bruits or JVD. Lungs:  Resp regular and unlabored, CTA. Heart: IRIRR no s3, s4, or murmurs. Abdomen: Soft, non-tender, non-distended, BS + x 4.  Extremities: No clubbing, cyanosis or edema. DP/PT/Radials 2+ and equal bilaterally.  Accessory Clinical Findings  CBC  Recent Labs  12/21/15 1633 12/22/15 0320 12/23/15 0311  WBC 17.2* 12.1* 10.5  NEUTROABS 14.9* 11.2*  --   HGB 14.8 13.4 12.1*  HCT 45.9 40.2 36.8*  MCV 97.5 97.6 96.3  PLT 234 188 [id number]   Basic Metabolic Panel  Recent Labs  12/22/15 0320 12/22/15 1548 12/23/15 0311  NA 138  --  138  K 4.3  --  4.3  CL 103  --  103  CO2 21*  --  28  GLUCOSE 172*  --  130*  BUN 31*  --  33*  CREATININE 1.32*  --  0.95  CALCIUM 8.4*  --  8.6*  MG  --  2.0  --    Liver Function Tests  Recent Labs  12/21/15 1633  AST 41  ALT 20  ALKPHOS 65  BILITOT 0.7  PROT 6.8  ALBUMIN 3.3*    Recent Labs  12/21/15 1633  LIPASE 20   Cardiac Enzymes  Recent Labs  12/21/15 2313 12/22/15 0320 12/22/15 1041  TROPONINI 0.07* 0.06* 0.05*   BNP Invalid input(s): POCBNP D-Dimer No results for input(s): DDIMER in the last 72 hours. Hemoglobin A1C No results for input(s): HGBA1C in the last 72 hours. Fasting Lipid Panel No results for input(s): CHOL, HDL, LDLCALC, TRIG, CHOLHDL, LDLDIRECT in the last 72 hours. Thyroid Function Tests  Recent Labs  12/22/15 1555  TSH 0.728    TELE  Atrial fib with a RVR and frequent PVC's with couplets  Radiology/Studies  Dg Chest 2 View  12/23/2015  CLINICAL DATA:  Dyspnea, history chronic atrial fibrillation, dilated cardiomyopathy, COPD, CHF cough pulmonary hypertension EXAM: CHEST  2 VIEW COMPARISON:  12/21/2015 FINDINGS: Upper normal size of cardiac silhouette. Atherosclerotic calcification aorta. Mediastinal contours and pulmonary vascularity normal. Emphysematous and  bronchitic changes consistent with COPD. Diffuse interstitial prominence in the mid to lower lungs appears chronic. Superimposed airspace infiltrates particularly in mid inferior LEFT hemi thorax appear improved since previous study. Probable tiny bibasilar effusions. No pneumothorax. Bones demineralized. IMPRESSION: Changes of COPD and chronic interstitial disease with improving superimposed infiltrates particularly in LEFT lung. Electronically Signed   By: Lavonia Dana M.D.   On: 12/23/2015 07:49   Dg Chest Port 1 View  12/21/2015  CLINICAL DATA:  Acute shortness of Breath EXAM: PORTABLE CHEST 1 VIEW COMPARISON:  11/29/2014 FINDINGS: Mild bilateral interstitial and alveolar opacities, left greater than right, improved somewhat since prior study, more so on the right. Suspect small effusions. Heart is upper limits normal in size. Underlying COPD/hyperinflation. IMPRESSION: Improving interstitial and alveolar opacities with mild persistent asymmetric edema pattern, left greater than right. Suspect small effusions. Electronically Signed   By: Rolm Baptise M.D.   On: 12/21/2015 16:55    ASSESSMENT AND PLAN  1. Apparent chronic atrial fib - his rate control should improve once his pneumonia improves. Would continue beta blocker and uptitrate if blood pressure allows. 2. PVC's - he does not feel palpitations. Will follow. As an outpatient, he will need a 24 hour holter monitor.  Gregg Taylor,M.D.  12/23/2015 10:03 AM

## 2015-12-23 NOTE — Progress Notes (Signed)
CRITICAL VALUE ALERT  Critical value received:  INR 5.78  Date of notification:  12/23/15  Time of notification:  0410  Critical value read back: yes  Nurse who received alert:  Diannia Ruder RN  MD notified (1st page):  Dr. Juleen China  Time of first page:  0410  MD notified (2nd page):  Time of second page:  Responding MD:  Dr. Juleen China  Time MD responded:  [phone]

## 2015-12-24 ENCOUNTER — Ambulatory Visit (HOSPITAL_COMMUNITY): Payer: Medicare Other

## 2015-12-24 DIAGNOSIS — I4891 Unspecified atrial fibrillation: Secondary | ICD-10-CM

## 2015-12-24 DIAGNOSIS — J189 Pneumonia, unspecified organism: Secondary | ICD-10-CM | POA: Insufficient documentation

## 2015-12-24 DIAGNOSIS — J449 Chronic obstructive pulmonary disease, unspecified: Secondary | ICD-10-CM | POA: Insufficient documentation

## 2015-12-24 LAB — PROCALCITONIN: PROCALCITONIN: 1.18 ng/mL

## 2015-12-24 LAB — LEGIONELLA ANTIGEN, URINE

## 2015-12-24 LAB — PROTIME-INR
INR: 4.33 — AB (ref 0.00–1.49)
Prothrombin Time: 40.3 seconds — ABNORMAL HIGH (ref 11.6–15.2)

## 2015-12-24 MED ORDER — AMOXICILLIN-POT CLAVULANATE 875-125 MG PO TABS: 1.0000 | ORAL_TABLET | Freq: Two times a day (BID) | ORAL | Status: DC

## 2015-12-24 MED ORDER — METOPROLOL TARTRATE 25 MG PO TABS: 25.0000 mg | ORAL_TABLET | Freq: Two times a day (BID) | ORAL | Status: DC

## 2015-12-24 MED ORDER — PREDNISONE 20 MG PO TABS: 40.0000 mg | ORAL_TABLET | Freq: Every day | ORAL | Status: DC

## 2015-12-24 NOTE — Progress Notes (Signed)
  Date: 12/24/2015  Patient name: Logan [name]  Medical record number: [id number]  Date of birth: 08-07-1925   This patient's plan of care was discussed with the house staff. Please see Dr. Marveen Reeks note for complete details. I concur with his findings.  Patient appears relatively comfortable today is some decreased breath sounds at the bases. When sitting up he became more tachypneic and he sided and we had lowered his oxygen from 4 L to 2 L. We will back up to 3 L and he began saturating in the 88-90% range.  Today will be day 4 of ceftriaxone therapy and he can change over to oral Augmentin tomorrow.  We are trying to see how much oxygen he may need when he leaves the hospital and also cardiac sure wewith PT and OT in terms of his ability to go home.     Truman Hayward, MD 12/24/2015, 10:58 AM

## 2015-12-24 NOTE — Progress Notes (Signed)
PT Cancellation Note  Patient Details Name: Logan [name] MRN: [id number] DOB: Jun 18, 1925   Cancelled Treatment:    Reason Eval/Treat Not Completed: Medical issues which prohibited therapy;Other (comment) (HR was 135-139).  Try again tomorrow, pt has fluctuated between 40's to 140.   Ramond Dial 12/24/2015, 12:39 PM   Mee Hives, PT MS Acute Rehab Dept. Number: [facility] [id number] and [place] [phone]

## 2015-12-24 NOTE — Discharge Summary (Signed)
Name: Logan [name] MRN: [id number] DOB: February 05, 1925 82 y.o. PCP: Leonard Downing, MD  Date of Admission: 12/21/2015  4:15 PM Date of Discharge: 12/25/2015 Attending Physician: Dr. 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     ANORO ELLIPTA 62.5-25 MCG/INH Aepb  Generic drug:  umeclidinium-vilanterol  Inhale 1 puff into the lungs daily as needed. For shortness of breath per patient     aspirin 81 MG tablet  Take 81 mg by mouth daily.     CAL-MAG-ZINC PO  Take 1 tablet by mouth daily.     dextromethorphan-guaiFENesin 30-600 MG 12hr tablet  Commonly known as:  MUCINEX DM  Take 1-2 tablets by mouth every 12 (twelve) hours as needed (cough, congestion).     digoxin 0.125 MG tablet  Commonly known as:  LANOXIN  Take 1 tablet (0.125 mg total) by mouth daily.     furosemide 40 MG tablet  Commonly known as:  LASIX  Take 40 mg by mouth daily. May take 1 extra daily as needed for leg swelling     levothyroxine 75 MCG tablet  Commonly known as:  SYNTHROID, LEVOTHROID  Take 1 tablet (75 mcg total) by mouth daily.     losartan 25 MG tablet  Commonly known as:  COZAAR  Take 25 mg by mouth daily.     metoprolol tartrate 25 MG tablet  Commonly known as:  LOPRESSOR  Take 1 tablet  (25 mg total) by mouth 2 (two) times daily.     multivitamin tablet  Take 1 tablet by mouth daily.     predniSONE 20 MG tablet  Commonly known as:  DELTASONE  Take 2 tablets (40 mg total) by mouth daily with breakfast.     PROAIR HFA 108 (90 Base) MCG/ACT inhaler  Generic drug:  albuterol  Inhale 2 puffs into the lungs every 4 (four) hours as needed for wheezing or shortness of breath.     simvastatin 80 MG tablet  Commonly known as:  ZOCOR  Take 1 tablet (80 mg total) by mouth at bedtime.     tamsulosin 0.4 MG Caps capsule  Commonly known as:  FLOMAX  Take 0.4 mg by mouth daily after breakfast.     warfarin 3 MG tablet  Commonly known as:  COUMADIN  Take as directed by coumadin clinic        Disposition and follow-up:   Mr.Kourtland F Oster was discharged from [facility] in [place] condition.  At the hospital follow up visit please address:  1.  Acute on chronic hypoxic respiratory failure: Due to baseline COPD stage IV and community acquired pneumonia. Patient discharged on augmentin BID and prednisone 40mg  to complete treatment course. Instructed to use home oxygen continuously and increase his rate from 2L to  4L for any physical activity and for any dyspnea. Needs oxygenation reassessment, repeat CXR to check for improving resolution of his pneumonia.  2. Continuous atrial fibrillation: Discharged on metoprolol 25mg  BID with goal to titrate up dose if blood pressure tolerates. Likely his heart rate is also acutely worsened by infection, breathing treatments, and steroids. Not sure when/why he stopped metoprolol since last year. Was previously on digoxin, consider use if failure to control on current medications.  3. Chronic dCHF, with previously reduced EF: Lasix held for AKI, that improved with holding. Needs metabolic panel checked and diuretic resumed as tolerating.  4. Supertherapeutic INR: Presented with INR > 5, no bleeding, normal Hgb. Had been vomiting  x3 days with altered PO intake. Follow up INR monitoring.  5. Solitary Pulmonary nodule: Demonstrated on CT scan 2014 with concern for bronchogenic carcinoma and never followed up at the indicated 6-12 months. Consider repeat imaging in setting of severe chronic lung disease, smoker, poor respiratory status.  6. Labs / imaging needed at time of follow-up: Chest x-ray, O2 sat/vitals, Bmet   Follow-up Appointments: Follow-up Information    Follow up with Leonard Downing, MD On 01/01/2016.   Specialty:  Family Medicine   Why:  @12 :30 pm For pneumonia recheck, oxygen recheck   Contact information:   [place]  [phone]       Discharge Instructions: Discharge Instructions    Face-to-face encounter (required for Medicare/Medicaid patients)    Complete by:  As directed   I Hinton Lovely certify that this patient is under my care and that I, or a nurse practitioner or physician's assistant working with me, had a face-to-face encounter that meets the physician face-to-face encounter requirements with this patient on 12/24/2015. The encounter with the patient was in whole, or in part for the following medical condition(s) which is the primary reason for home health care (List medical condition): Pneumonia, atrial fibrillation, COPD  The encounter with the patient was in whole, or in part, for the following medical condition, which is the primary reason for home health care:  Pneumonia, atrial fibrillation, COPD  I certify that, based on my findings, the following services are medically necessary home health services:  Physical therapy  Reason for Medically Necessary Home Health Services:  Skilled Nursing- Skilled Assessment/Observation  My clinical findings support the need for the above services:  Shortness of breath with activity  Further, I certify that my clinical findings support that this patient is homebound due to:  Shortness of Breath with activity       Home Health    Complete by:  As directed   To provide the following care/treatments:   PT OT Respiratory Care    Gait, strength training, assessment of home supplemental oxygen use and requirement           Consultations: Treatment Team:  Arnoldo Lenis, MD  Procedures Performed:  Dg Chest 2 View  12/23/2015  CLINICAL DATA:  Dyspnea, history chronic atrial fibrillation, dilated cardiomyopathy, COPD, CHF cough pulmonary hypertension EXAM: CHEST  2 VIEW COMPARISON:  12/21/2015 FINDINGS: Upper normal size of cardiac silhouette. Atherosclerotic calcification aorta. Mediastinal contours and pulmonary vascularity normal. Emphysematous and bronchitic changes consistent with COPD. Diffuse interstitial prominence in the mid to lower lungs appears chronic. Superimposed airspace infiltrates particularly in mid inferior LEFT hemi thorax appear improved since previous study. Probable tiny bibasilar effusions. No pneumothorax. Bones demineralized. IMPRESSION: Changes of COPD and chronic interstitial disease with improving superimposed infiltrates  particularly in LEFT lung. Electronically Signed   By: Lavonia Dana M.D.   On: 12/23/2015 07:49   Dg Chest Port 1 View  12/21/2015  CLINICAL DATA:  Acute shortness of Breath EXAM: PORTABLE CHEST 1 VIEW COMPARISON:  11/29/2014 FINDINGS: Mild bilateral interstitial and alveolar opacities, left greater than right, improved somewhat since prior study, more so on the right. Suspect small effusions. Heart is upper limits normal in size. Underlying COPD/hyperinflation. IMPRESSION: Improving interstitial and alveolar opacities with mild persistent asymmetric edema pattern, left greater than right. Suspect small effusions. Electronically Signed   By: Rolm Baptise M.D.   On: 12/21/2015 16:55    2D Echo: LV EF: 55% -  60%  ------------------------------------------------------------------- Indications:   Atrial fibrillation -  427.31.  ------------------------------------------------------------------- History:  PMH: Cardiomyopathy. Chronic anticoagulants. Dyspnea. Primary pulmonary hypertension. Risk factors: Dyslipidemia.  ------------------------------------------------------------------- Study Conclusions  - Left ventricle: The cavity size was normal. Wall thickness was increased in a pattern of mild LVH. Systolic function was normal. The estimated ejection fraction was in the range of 55% to 60%. - Left atrium: The atrium was moderately dilated. - Right ventricle: RV is not well seen to evalaute function. RVEF may be normal to mildly decreased. - Right atrium: The atrium was moderately dilated. - Pulmonary arteries: PA peak pressure: 53 mm Hg (S).  Transthoracic echocardiography. M-mode, complete 2D, spectral Doppler, and color Doppler. Birthdate: Patient birthdate: 12-May-1925. Age: Patient is 82 yr old. Sex: Gender: male. BMI: 19.1 kg/m^2. Blood pressure:   128/87 Patient status: Inpatient. Study date: Study date: 12/24/2015. Study time: 10:38 AM. Location: Bedside.  -------------------------------------------------------------------  ------------------------------------------------------------------- Left ventricle: The cavity size was normal. Wall thickness was increased in a pattern of mild LVH. Systolic function was normal. The estimated ejection fraction was in the range of 55% to 60%.  ------------------------------------------------------------------- Aortic valve:  Mildly thickened, mildly calcified leaflets. Doppler: There was no significant regurgitation.  ------------------------------------------------------------------- Aorta: Aortic root is mildly dilated at 43 mm Ascending aorta is mildly dilated at 41 mm.  ------------------------------------------------------------------- Mitral valve:  Mildly thickened leaflets . Doppler: There was  no significant regurgitation.  Peak gradient (D): 2 mm Hg.  ------------------------------------------------------------------- Left atrium: The atrium was moderately dilated.  ------------------------------------------------------------------- Right ventricle: RV is not well seen to evalaute function. RVEF may be normal to mildly decreased. The cavity size was normal. Wall thickness was normal.  ------------------------------------------------------------------- Pulmonic valve:  Poorly visualized. Doppler: There was no significant regurgitation.  ------------------------------------------------------------------- Tricuspid valve:  Structurally normal valve.  Leaflet separation was normal. Doppler: Transvalvular velocity was within the normal range. There was mild regurgitation.  ------------------------------------------------------------------- Right atrium: The atrium was moderately dilated.  ------------------------------------------------------------------- Pericardium: A trivial pericardial effusion was identified.  ------------------------------------------------------------------- Systemic veins: Inferior vena cava: The vessel was dilated. The respirophasic diameter changes were blunted (< 50%), consistent with elevated central venous pressure.  ------------------------------------------------------------------- Post procedure conclusions Ascending Aorta:  - Aortic root is mildly dilated at 43 mm Ascending aorta is mildly dilated at 41 mm.  ------------------------------------------------------------------- Measurements  Left ventricle              Value    Reference LV ID, ED, PLAX chordal     (L)   31.8 mm   43 - 52 LV ID, ES, PLAX chordal     (L)   21.5 mm   23 - 38 LV fx shortening, PLAX chordal      32  %   >=29 LV PW thickness, ED  12.6 mm   --------- IVS/LV PW  ratio, ED           1.14     <=1.3 Stroke volume, 2D            30  ml   --------- Stroke volume/bsa, 2D          18  ml/m^2 --------- LV ejection fraction, 1-p A4C      61  %   --------- LV end-diastolic volume, 2-p       60  ml   --------- LV end-systolic volume, 2-p       25  ml   --------- LV ejection fraction, 2-p        58  %   --------- Stroke volume, 2-p            35  ml   --------- LV end-diastolic volume/bsa, 2-p     37  ml/m^2 --------- LV end-systolic volume/bsa, 2-p     15  ml/m^2 --------- Stroke volume/bsa, 2-p          21.3 ml/m^2 --------- LV e&', lateral              13.3 cm/s  --------- LV E/e&', lateral             5.68     --------- LV e&', medial              12.2 cm/s  --------- LV E/e&', medial             6.19     --------- LV e&', average              12.75 cm/s  --------- LV E/e&', average             5.92     ---------  Ventricular septum            Value    Reference IVS thickness, ED            14.4 mm   ---------  LVOT                   Value    Reference LVOT ID, S                21  mm   --------- LVOT area                3.46 cm^2  --------- LVOT peak velocity, S          61.7 cm/s  --------- LVOT mean velocity, S          40  cm/s  --------- LVOT VTI, S               8.53 cm   --------- LVOT peak gradient, S          2   mm Hg ---------  Aorta                  Value    Reference Aortic root ID, ED            43  mm   ---------  Left atrium               Value    Reference LA ID, A-P, ES               37  mm   --------- LA ID/bsa, A-P          (H)   2.25  cm/m^2 <=2.2 LA volume, S               54  ml   --------- LA volume/bsa, S             32.9 ml/m^2 --------- LA volume, ES, 1-p A4C          72  ml   --------- LA volume/bsa, ES, 1-p A4C        43.8 ml/m^2 --------- LA volume, ES, 1-p A2C          37  ml   --------- LA volume/bsa, ES, 1-p A2C        22.5 ml/m^2 ---------  Mitral valve               Value    Reference Mitral E-wave peak velocity       75.5 cm/s  --------- Mitral deceleration time         222  ms   150 - 230 Mitral peak gradient, D         2   mm Hg ---------  Pulmonary arteries            Value    Reference PA pressure, S, DP        (H)   53  mm Hg <=30  Tricuspid valve             Value    Reference Tricuspid regurg peak velocity      307  cm/s  --------- Tricuspid peak RV-RA gradient      38  mm Hg ---------  Systemic veins              Value    Reference Estimated CVP              15  mm Hg ---------  Right ventricle             Value    Reference RV pressure, S, DP        (H)   53  mm Hg <=30 RV s&', lateral, S            14.8 cm/s  ---------  Admission HPI: Mr. Diberardino is an 82 year old man with a past medical history of COPD GOLD stage IV, A.fib on warfarin, CHF with dilated cardiomyopathy, hypothyroidism, previous vocal cord cancer s/p radiation presents to [facility] ED tonight with nausea/vomiting/diarrhea and worsening shortness of breath. He reports that he has had the N/V/D for the past 3 days. He has had 3-4 episodes of non-bloody diarrhea a day with associated nausea and vomiting. Symptoms have been improving but he reports worsening shortness of breath for  the past 2 nights. He reports using 2L O2 at night and when he leaves home but does not regularly use O2 during the day. Denies any fevers or chills. No increased cough or sputum production from baseline and no change in sputum color. No recent antibiotic use or travel. Reports that his wife was admitted with similar symptoms of nausea/vomiting/diarrhea and discharged today. He is unable to recall what she was diagnosed with at that time. He called EMS today concerned with his shortness of breath. When EMS arrived his O2 sat was 70-80% on home 2L O2 with HR 150-160s. Received albuterol and solu-medrol with improved O2 sat to 97% and HR improved to 120s. Noted to have significant increased work of breathing and diffuse wheezing and rales on arrival to the ED. Placed on BiPAP with improvement. Reports  a history of PNA. Denies any myalgias, chest pain, headache. Did get his flu shot this year.   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 He was placed on Bipap and started Iv solumedtrol with improvement in saturation and work of breathing. CXR demonstrated interstitial and alveolar opacities infiltrates vs effusions L>R. Leukocytosis to 17.5 with neutrophil predominance and elevated LA of 3.05. BNP checked of 163 (previous exacerbations much higher).  Initially concerned for viral vs bacterial respiratory infection obtained PCT of 4.5 and continued abtx for CAP azithromycin and ceftriaxone. Decreased steroids to 40mg  prednisone by hospital day 1. Influenza PCR negative. Respiratory sputum culture positive for GPC in clusters. His breathing was much improved by day 2 however still required 3-4L of oxygen to maintain saturation 90% or better which is more than his baseline 2L.  Atrial fibrillation, with RVR: Has permanent Afib on anticoagulation. Noted to have tachycardia up to 120s-150s at ED. On monitor throughout course continued varying heart rate from 70s-130s with numerous paired and triplet PVCs. Completely asymptomatic with systolic blood pressure [id number]. Cardiology was consulted for advice and patient was restarted on metoprolol then titrated to 25mg  BID. Digoxin was discontinued.  Chronic dCHF, with previously reduced EF: Presented with elevated SCr of 1.32 so held lasix. Improved down to 0.95 after gentle IV fluids and holding diuretics. Obtained repeat TTE that showed no worsening systolic or diastolic function compared to previous study in 2015.  Supertherapeutic INR: INR 5.37 on admission to 5.78 on repeat. Warfarin was held during hospitalization with decrease to 4.33 prior to discharge.  Discharge Vitals:   BP 117/74 mmHg  Pulse 76  Temp(Src) 97.4 F (36.3 C) (Oral)  Resp 18  Ht 5\' 8"  (1.727 m)  Wt 56.926 kg (125 lb 8 oz)  BMI 19.09 kg/m2  SpO2 96%  Discharge Labs:  No results found for this or any previous visit (from the past 24 hour(s)).  Signed: Collier Salina, MD 12/25/2015, 7:31 AM    Services Ordered on Discharge: Home health PT, OT, respiratory

## 2015-12-24 NOTE — Care Management Important Message (Signed)
Important Message  Patient Details  Name: Logan [name] MRN: [id number] Date of Birth: 04/29/1925   Medicare Important Message Given:  Yes    [name] [name] 12/24/2015, 2:58 PM

## 2015-12-24 NOTE — Progress Notes (Signed)
ANTICOAGULATION CONSULT NOTE - Follow-up Consult  Pharmacy Consult for Warfarin Indication: atrial fibrillation  No Known Allergies  Patient Measurements: Height: 5\' 8"  (172.7 cm) Weight: 125 lb 8 oz (56.926 kg) (scale b) IBW/kg (Calculated) : 68.4   Vital Signs: Temp: 97.6 F (36.4 C) (03/06 0456) Temp Source: Oral (03/06 0456) BP: 128/87 mmHg (03/06 0859) Pulse Rate: 89 (03/06 0859)  Labs:  Recent Labs  12/21/15 1633 12/21/15 2313 12/22/15 0320 12/22/15 1041 12/23/15 0311 12/24/15 0450  HGB 14.8  --  13.4  --  12.1*  --   HCT 45.9  --  40.2  --  36.8*  --   PLT 234  --  188  --  185  --   LABPROT 36.8*  --  47.7*  --  50.1* 40.3*  INR 3.83*  --  5.37*  --  5.78* 4.33*  CREATININE 1.28*  --  1.32*  --  0.95  --   TROPONINI 0.05* 0.07* 0.06* 0.05*  --   --     Estimated Creatinine Clearance: 42.4 mL/min (by C-G formula based on Cr of 0.95).  Assessment: 82yo male with history of Afib on warfarin PTA presents with N/V/D and worsening SOB. Pharmacy is consulted to dose warfarin for atrial fibrillation. INR 3.83 on admit, CBC wnl  INR today 4.33, still supratherapeutic, no bleeding per chart.   PTA Warfarin Dose: 3mg  Sun and 4.5mg  AODs with last dose 3/3  Goal of Therapy:  INR 2-3 Monitor platelets by anticoagulation protocol: Yes   Plan:  Hold warfarin tonight Daily INR Monitor s/sx of bleeding  Maryanna Shape, PharmD, BCPS  Clinical Pharmacist  Pager: [phone]   12/24/2015 10:42 AM

## 2015-12-24 NOTE — Progress Notes (Signed)
Subjective: Denies any complaints, feels he is doing well about normal for him. SpO2 still varying needing up to 4L O2 periodically overnight and this morning. No GI complaints. Wants to get home so he can see his cardiologist in clinic tomorrow.  Objective: Vital signs in last 24 hours: Filed Vitals:   12/24/15 0253 12/24/15 0456 12/24/15 0737 12/24/15 0859  BP:  106/69  128/87  Pulse:  115  89  Temp:  97.6 F (36.4 C)    TempSrc:  Oral    Resp:  18    Height:      Weight:  56.926 kg (125 lb 8 oz)    SpO2: 95% 95% 91%    Weight change: -1.3 kg (-2 lb 13.9 oz)  Intake/Output Summary (Last 24 hours) at 12/24/15 1002 Last data filed at 12/24/15 0802  Gross per 24 hour  Intake    295 ml  Output    900 ml  Net   -605 ml   GENERAL-pleasant, thin, elderly man in NAD HEENT- Atraumatic, PERRL, oral mucosa appears dry, no cervical LN enlargement. CARDIAC- tachycardic and irregular RESP-Fair air movement, diffuse coarse breath sounds, no wheezes ABDOMEN- Soft, nontender, no guarding or rebound EXTREMITIES- symmetric, no pedal edema. SKIN- Warm, dry, No rash or lesion. PSYCH- Normal mood and affect, appropriate thought content and speech.   Lab Results: Basic Metabolic Panel:  Recent Labs Lab 12/22/15 0320 12/22/15 1548 12/23/15 0311  NA 138  --  138  K 4.3  --  4.3  CL 103  --  103  CO2 21*  --  28  GLUCOSE 172*  --  130*  BUN 31*  --  33*  CREATININE 1.32*  --  0.95  CALCIUM 8.4*  --  8.6*  MG  --  2.0  --    Liver Function Tests:  Recent Labs Lab 12/21/15 1633  AST 41  ALT 20  ALKPHOS 65  BILITOT 0.7  PROT 6.8  ALBUMIN 3.3*    Recent Labs Lab 12/21/15 1633  LIPASE 20   CBC:  Recent Labs Lab 12/21/15 1633 12/22/15 0320 12/23/15 0311  WBC 17.2* 12.1* 10.5  NEUTROABS 14.9* 11.2*  --   HGB 14.8 13.4 12.1*  HCT 45.9 40.2 36.8*  MCV 97.5 97.6 96.3  PLT 234 188 185   Cardiac Enzymes:  Recent Labs Lab 12/21/15 2313 12/22/15 0320  12/22/15 1041  TROPONINI 0.07* 0.06* 0.05*    Thyroid Function Tests:  Recent Labs Lab 12/22/15 1555  TSH 0.728   Coagulation:  Recent Labs Lab 12/21/15 1633 12/22/15 0320 12/23/15 0311 12/24/15 0450  LABPROT 36.8* 47.7* 50.1* 40.3*  INR 3.83* 5.37* 5.78* 4.33*   Urinalysis:  Recent Labs Lab 12/21/15 2026  COLORURINE YELLOW  LABSPEC >1.030*  PHURINE 6.0  GLUCOSEU NEGATIVE  HGBUR NEGATIVE  BILIRUBINUR NEGATIVE  KETONESUR NEGATIVE  PROTEINUR NEGATIVE  NITRITE NEGATIVE  LEUKOCYTESUR NEGATIVE   Micro Results: Recent Results (from the past 240 hour(s))  Culture, blood (routine x 2)     Status: None (Preliminary result)   Collection Time: 12/21/15  4:33 PM  Result Value Ref Range Status   Specimen Description BLOOD RIGHT FOREARM  Final   Special Requests BOTTLES DRAWN AEROBIC AND ANAEROBIC 5CC  Final   Culture NO GROWTH 2 DAYS  Final   Report Status PENDING  Incomplete  Culture, blood (routine x 2)     Status: None (Preliminary result)   Collection Time: 12/21/15  4:48 PM  Result Value Ref  Range Status   Specimen Description BLOOD RIGHT HAND  Final   Special Requests BOTTLES DRAWN AEROBIC AND ANAEROBIC 5CC  Final   Culture NO GROWTH 1 DAY  Final   Report Status PENDING  Incomplete  MRSA PCR Screening     Status: None   Collection Time: 12/22/15  5:25 AM  Result Value Ref Range Status   MRSA by PCR NEGATIVE NEGATIVE Final    Comment:        The GeneXpert MRSA Assay (FDA approved for NASAL specimens only), is one component of a comprehensive MRSA colonization surveillance program. It is not intended to diagnose MRSA infection nor to guide or monitor treatment for MRSA infections.   Culture, expectorated sputum-assessment     Status: None   Collection Time: 12/22/15  8:16 AM  Result Value Ref Range Status   Specimen Description SPUTUM  Final   Special Requests NONE  Final   Sputum evaluation   Final    THIS SPECIMEN IS ACCEPTABLE. RESPIRATORY CULTURE  REPORT TO FOLLOW.   Report Status 12/22/2015 FINAL  Final  Culture, respiratory (NON-Expectorated)     Status: None (Preliminary result)   Collection Time: 12/22/15  8:16 AM  Result Value Ref Range Status   Specimen Description SPUTUM  Final   Special Requests NONE  Final   Gram Stain   Final    ABUNDANT WBC PRESENT, PREDOMINANTLY PMN NO SQUAMOUS EPITHELIAL CELLS SEEN ABUNDANT GRAM POSITIVE COCCI IN CLUSTERS FEW YEAST Performed at [facility]    Culture PENDING  Incomplete   Report Status PENDING  Incomplete   Studies/Results: Dg Chest 2 View  12/23/2015  CLINICAL DATA:  Dyspnea, history chronic atrial fibrillation, dilated cardiomyopathy, COPD, CHF cough pulmonary hypertension EXAM: CHEST  2 VIEW COMPARISON:  12/21/2015 FINDINGS: Upper normal size of cardiac silhouette. Atherosclerotic calcification aorta. Mediastinal contours and pulmonary vascularity normal. Emphysematous and bronchitic changes consistent with COPD. Diffuse interstitial prominence in the mid to lower lungs appears chronic. Superimposed airspace infiltrates particularly in mid inferior LEFT hemi thorax appear improved since previous study. Probable tiny bibasilar effusions. No pneumothorax. Bones demineralized. IMPRESSION: Changes of COPD and chronic interstitial disease with improving superimposed infiltrates particularly in LEFT lung. Electronically Signed   By: Lavonia Dana M.D.   On: 12/23/2015 07:49   Medications: I have reviewed the patient's current medications. Scheduled Meds: . antiseptic oral rinse  7 mL Mouth Rinse BID  . aspirin EC  81 mg Oral Daily  . atorvastatin  40 mg Oral q1800  . azithromycin  250 mg Intravenous Q24H  . cefTRIAXone (ROCEPHIN)  IV  1 g Intravenous Q24H  . ipratropium-albuterol  3 mL Nebulization Q6H  . levothyroxine  75 mcg Oral QAC breakfast  . metoprolol tartrate  12.5 mg Oral BID  . multivitamin with minerals  1 tablet Oral Daily  . predniSONE  40 mg Oral Q breakfast  .  sodium chloride flush  3 mL Intravenous Q12H  . tamsulosin  0.4 mg Oral QPC breakfast  . Warfarin - Pharmacist Dosing Inpatient   Does not apply q1800   Continuous Infusions:  PRN Meds:.acetaminophen **OR** acetaminophen, guaiFENesin, sodium chloride flush Assessment/Plan:  Acute hypoxic respiratory failure 2/2 pneumonia: Still requiring above his home 2L O2 to maintain saturations >88%. Feels asymptomatic. May require increase support above his baseline for a while while pneumonia is resolving. Likely plan to discharge on oxygen as needed by assessment this afternoon, with PCP followup needed to titrate and also assess PNA resolution,  and his ?nodules. -continue azithro and rocephin -> can change to augmentin for PO completion of course (Day 5 to 12/25/15) -Prednisone 40 mg PO, duonebs -Guaifenesin prn -PT/OT eval and treat -Maintain O2 sats 88-92% - blood cultures, sputum cultures NGTD -Strep pneum neg, legionella urine Ag neg  AKI: resolving, Cr 0.95 today - Resume home lasix tomorrow  Chronic A. Fib: On warfarin, coreg and digoxin at home. TSH nl. Mag nl. Currently supertherapeutic INR. - cardiology consulted, appreciate their recommendations - ECHO ordered on schedule today - lopressor 12.5mg  BID - digoxin stopped -Warfarin per pharmacy  -Daily INR  Diastolic CHF with dilated cardiomyopathy: In 2008 ejection fraction was 25-35%. Repeat echocardiogram in Jan 2015 showed EF of 50-55%. -losartan 25 mg daily -holding lasix per above -Lopressor 12.5 mg BID  Pulmonary nodules: 2014 CT noting suspicious for bronchogenic carcinoma nodules that were never followed up despite radiology recs for 6-71months. Needs discussion about rescanning particularly if failing to improve  Hypothyroidism: Stable, continue home levothyroxine 75 HLD: Stable, continue home simvastatin  Dispo: Disposition is deferred at this time, awaiting improvement of current medical problems. Anticipated discharge  to home later today.  The patient does have a current PCP Redmond Pulling Arna Medici, MD) and does need an [facility] hospital follow-up appointment after discharge.  The patient does not have transportation limitations that hinder transportation to clinic appointments.   LOS: 3 days   Collier Salina, MD 12/24/2015, 10:02 AM

## 2015-12-24 NOTE — Discharge Instructions (Addendum)
You were admitted for an infection in the lung - pneumonia. This also appears to have worsened your chronic atrial fibrillation that may also worsen shortness of breath.  >>We recommend you use your home oxygen continuously at first and may increase the rate up to 4 liter/minute if you feel any shortness of breath or start any physical exertion. >>You will also need to take metoprolol 25mg  TWICE DAILY or a dose as directed by your physician at follow up appointment.  After discharge you should finish your pneumonia treatment by taking: >>Augmentin 1 pill TWICE DAILY >>Prednisone 2 pills daily Both medicines starting tomorrow March 7 for 2 days.  Also follow up with your Cardiologist Dr. Martinique at clinic tomorrow and he may wish to change your heart medication or arrange to start a prolonged cardiac monitoring for your heart rhythm.

## 2015-12-24 NOTE — Progress Notes (Signed)
  Echocardiogram 2D Echocardiogram has been performed.  Logan [name] 12/24/2015, 11:50 AM

## 2015-12-24 NOTE — Progress Notes (Signed)
    Subjective:  Denies CP or dyspnea   Objective:  Filed Vitals:   12/24/15 0456 12/24/15 0737 12/24/15 0859 12/24/15 1200  BP: 106/69  128/87 117/74  Pulse: 115  89 76  Temp: 97.6 F (36.4 C)   97.4 F (36.3 C)  TempSrc: Oral   Oral  Resp: 18   18  Height:      Weight: 125 lb 8 oz (56.926 kg)     SpO2: 95% 91%  96%    Intake/Output from previous day:  Intake/Output Summary (Last 24 hours) at 12/24/15 1207 Last data filed at 12/24/15 1203  Gross per 24 hour  Intake    295 ml  Output    800 ml  Net   -505 ml    Physical Exam: Physical exam: Well-developed frail in no acute distress.  Skin is warm and dry.  HEENT is normal.  Neck is supple. Chest with diminished BS throughout Cardiovascular exam is irregular and tachycardic Abdominal exam nontender or distended. No masses palpated. Extremities show no edema. neuro grossly intact    Lab Results: Basic Metabolic Panel:  Recent Labs  12/22/15 0320 12/22/15 1548 12/23/15 0311  NA 138  --  138  K 4.3  --  4.3  CL 103  --  103  CO2 21*  --  28  GLUCOSE 172*  --  130*  BUN 31*  --  33*  CREATININE 1.32*  --  0.95  CALCIUM 8.4*  --  8.6*  MG  --  2.0  --    CBC:  Recent Labs  12/21/15 1633 12/22/15 0320 12/23/15 0311  WBC 17.2* 12.1* 10.5  NEUTROABS 14.9* 11.2*  --   HGB 14.8 13.4 12.1*  HCT 45.9 40.2 36.8*  MCV 97.5 97.6 96.3  PLT 234 188 185   Cardiac Enzymes:  Recent Labs  12/21/15 2313 12/22/15 0320 12/22/15 1041  TROPONINI 0.07* 0.06* 0.05*     Assessment/Plan:  1 permanent atrial fibrillation-the patient's heart rate remains elevated. Increase metoprolol to 25 mg twice a day. His blood pressure will likely limit further advances. He may need resumption of digoxin in the future. We will plan outpatient monitor to further assess this once he follows up in clinic. Continue Coumadin. Await follow-up echocardiogram. Some elevated heart rate likely driven by COPD/pneumonia. 2  Pneumonia/COPD-antibiotics, steroids and bronchodilators per primary care.  Kirk Ruths 12/24/2015, 12:07 PM

## 2015-12-24 NOTE — Evaluation (Signed)
Occupational Therapy Evaluation Patient Details Name: Logan [name] MRN: [id number] DOB: 1925-06-28 Today's Date: 12/24/2015    History of Present Illness Logan [name] is a 82 y.o.male hstory of permanent afib, chronic diastolic heart failure, history of PVCs, COPD hypothyroidism admitted with SOB. Diagnosed with sepsis secondary to respiratory infection, metabolic/lactic acidosis, and COPD exacerbation   Clinical Impression   Pt admitted as above and is noted with fluctuating bradycardia during OT assessment today and he was a symptomatic throughout. Pt currently demonstrates deficits in his ability to perform ADL's (see OT problem list below). He reports that he feels close to baseline level, however he should benefit from acute OT to assist in maximizing independence with ADL's and for pt education/energy conservation techniques followed by intermittent supervision/assist and HHOT at discharge.   Follow Up Recommendations  Home health OT;Supervision - Intermittent    Equipment Recommendations  Other (comment) (Pt reports no DME needs)    Recommendations for Other Services       Precautions / Restrictions Precautions Precautions: Fall Precaution Comments: watch HR      Mobility Bed Mobility Overal bed mobility: Needs Assistance Bed Mobility: Supine to Sit     Supine to sit: Modified independent (Device/Increase time)        Transfers Overall transfer level: Needs assistance Equipment used: Rolling walker (2 wheeled) Transfers: Sit to/from [facility] Sit to Stand: Min guard Stand pivot transfers: Min guard       General transfer comment: Minguard assist to steady & vc's for RW as well as monitoring HR. Watch HR    Balance Overall balance assessment: Needs assistance Sitting-balance support: No upper extremity supported;Feet supported Sitting balance-Leahy Scale: Good     Standing balance support: Bilateral upper extremity  supported Standing balance-Leahy Scale: Fair                              ADL Overall ADL's : Needs assistance/impaired     Grooming: Wash/dry hands;Sitting;Supervision/safety   Upper Body Bathing: Supervision/ safety;Set up;Sitting   Lower Body Bathing: Set up;Sit to/from stand;Min guard   Upper Body Dressing : Modified independent;Set up;Sitting   Lower Body Dressing: Min guard;Sit to/from stand   Toilet Transfer: Min guard;Ambulation;BSC;RW   Toileting- Clothing Manipulation and Hygiene: Supervision/safety;Sitting/lateral lean       Functional mobility during ADLs: Min guard;Rolling walker General ADL Comments: Pt was noted with fluctuating bradycardia during OT assessment today and he was a symptomatic throughout. Lying down, pt was 87-132, sitting EOB was 32-93 and pt was asymptomatic and fluctuating greatly while sitting (ranged from 32-137 in sitting EOB), BP in sitting 116/61, 92, O2 93% on 2 L O2 via nasal cannula.  Pt requested to stand after HR  returned to normal and in standing was 116, pt HR  ranged from 77-116 in standing w/ O2 @84 %. Pt sat EOB and O2 returned to 93% and HR continued to fluctuate 38-130. Pt assisted back to bed and discussed role of OT .     Vision  Wears glasses, no change from baseline.   Perception     Praxis      Pertinent Vitals/Pain Pain Assessment: No/denies pain     Hand Dominance Right   Extremity/Trunk Assessment Upper Extremity Assessment Upper Extremity Assessment: Overall WFL for tasks assessed   Lower Extremity Assessment Lower Extremity Assessment: Defer to PT evaluation;Generalized weakness       Communication Communication Communication: No difficulties  Cognition Arousal/Alertness: Awake/alert Behavior During Therapy: WFL for tasks assessed/performed Overall Cognitive Status: Within Functional Limits for tasks assessed                     General Comments       Exercises        Shoulder Instructions      Home Living Family/patient expects to be discharged to:: Private residence Living Arrangements: Spouse/significant other Available Help at Discharge: Family;Available 24 hours/day Type of Home: House Home Access: Stairs to enter [employer] of Steps: 5 Entrance Stairs-Rails: Right;Left Home Layout: One level     Bathroom Shower/Tub: Tub/shower unit Shower/tub characteristics: Curtain Bathroom Toilet: Standard (Sink Close)     Home Equipment: Environmental consultant - 2 wheels;Shower seat          Prior Functioning/Environment Level of Independence: Independent        Comments: I believe he is on home O2, but not exactly sure, "I use it only at night time when I'm sleeping"    OT Diagnosis: Generalized weakness   OT Problem List: Cardiopulmonary status limiting activity;Decreased knowledge of use of DME or AE;Decreased strength   OT Treatment/Interventions: Self-care/ADL training;Energy conservation;DME and/or AE instruction;Patient/family education;Therapeutic activities    OT Goals(Current goals can be found in the care plan section) Acute Rehab OT Goals Patient Stated Goal: Hopes to go home today if possible Time For Goal Achievement: 01/07/16 Potential to Achieve Goals: Good ADL Goals Pt Will Perform Grooming: with modified independence;standing Pt Will Transfer to Toilet: with modified independence;ambulating;bedside commode Pt Will Perform Toileting - Clothing Manipulation and hygiene: with modified independence;with adaptive equipment;sitting/lateral leans;sit to/from stand Additional ADL Goal #1: Pt will be Mod I with energy conservation techniques and will implement independently during ADL's  OT Frequency: Min 2X/week   Barriers to D/C:            Co-evaluation              End of Session Equipment Utilized During Treatment: Gait belt;Rolling walker;Oxygen Nurse Communication: Mobility status;Other (comment) (Bradycardia in  sitting EOB)  Activity Tolerance: Treatment limited secondary to medical complications (Comment) (Pt bradycardic during OT session today. He was asymptomatic) Patient left: in bed;with call bell/phone within reach   Time: [phone] OT Time Calculation (min): 32 min Charges:  OT General Charges $OT Visit: 1 Procedure OT Evaluation $OT Eval Moderate Complexity: 1 Procedure OT Treatments $Therapeutic Activity: 8-22 mins G-Codes:    Almyra Deforest, OTR/L 12/24/2015, 10:41 AM

## 2015-12-25 ENCOUNTER — Encounter: Payer: Self-pay | Admitting: Cardiology

## 2015-12-25 ENCOUNTER — Ambulatory Visit (INDEPENDENT_AMBULATORY_CARE_PROVIDER_SITE_OTHER): Payer: Medicare Other | Admitting: Cardiology

## 2015-12-25 VITALS — BP 110/70 | HR 74 | Ht 68.0 in | Wt 128.0 lb

## 2015-12-25 DIAGNOSIS — I5032 Chronic diastolic (congestive) heart failure: Secondary | ICD-10-CM | POA: Diagnosis not present

## 2015-12-25 DIAGNOSIS — J189 Pneumonia, unspecified organism: Secondary | ICD-10-CM | POA: Diagnosis not present

## 2015-12-25 DIAGNOSIS — Z7901 Long term (current) use of anticoagulants: Secondary | ICD-10-CM | POA: Diagnosis not present

## 2015-12-25 DIAGNOSIS — I482 Chronic atrial fibrillation, unspecified: Secondary | ICD-10-CM

## 2015-12-25 DIAGNOSIS — J9612 Chronic respiratory failure with hypercapnia: Secondary | ICD-10-CM

## 2015-12-25 LAB — CULTURE, RESPIRATORY W GRAM STAIN

## 2015-12-25 LAB — CULTURE, RESPIRATORY

## 2015-12-25 NOTE — Patient Instructions (Signed)
Continue your current therapy  I will see you in 6 months.   

## 2015-12-26 LAB — CULTURE, BLOOD (ROUTINE X 2): Culture: NO GROWTH

## 2015-12-26 NOTE — Progress Notes (Signed)
Logan [name] Date of Birth: December 12, 1924   History of Present Illness: Logan [name] is seen for followup Afib and CHF. He has a history of permanent atrial fibrillation managed with rate control and anticoagulation. He also has chronic diastolic heart failure with last ejection fraction of 55-60%.  He was just discharged from the hospital yesterday after being admitted with PNA and respiratory failure with COPD exacerbation. He was DC on antibiotics and steroids. Oxygen therapy was increased. He was started on low dose metoprolol for rate control of Afib (HR increased due to stress of illness). He is still feeling quite weak. Appetite is poor. No diarrhea. No chest pain or edema.     Current Outpatient Prescriptions on File Prior to Visit  Medication Sig Dispense Refill  . amoxicillin-clavulanate (AUGMENTIN) 875-125 MG tablet Take 1 tablet by mouth 2 (two) times daily. 4 tablet 0  . aspirin 81 MG tablet Take 81 mg by mouth daily.    . Calcium-Magnesium-Zinc (CAL-MAG-ZINC PO) Take 1 tablet by mouth daily.     Marland Kitchen dextromethorphan-guaiFENesin (MUCINEX DM) 30-600 MG per 12 hr tablet Take 1-2 tablets by mouth every 12 (twelve) hours as needed (cough, congestion).    Marland Kitchen digoxin (LANOXIN) 0.125 MG tablet Take 1 tablet (0.125 mg total) by mouth daily. 30 tablet 11  . furosemide (LASIX) 40 MG tablet Take 40 mg by mouth daily. May take 1 extra daily as needed for leg swelling    . levothyroxine (SYNTHROID, LEVOTHROID) 75 MCG tablet Take 1 tablet (75 mcg total) by mouth daily. 30 tablet 2  . losartan (COZAAR) 25 MG tablet Take 25 mg by mouth daily.     . metoprolol tartrate (LOPRESSOR) 25 MG tablet Take 1 tablet (25 mg total) by mouth 2 (two) times daily. 60 tablet 0  . Multiple Vitamin (MULTIVITAMIN) tablet Take 1 tablet by mouth daily.     . predniSONE (DELTASONE) 20 MG tablet Take 2 tablets (40 mg total) by mouth daily with breakfast. 4 tablet 0  . PROAIR HFA 108 (90 BASE) MCG/ACT inhaler Inhale 2  puffs into the lungs every 4 (four) hours as needed for wheezing or shortness of breath.     . simvastatin (ZOCOR) 80 MG tablet Take 1 tablet (80 mg total) by mouth at bedtime. 30 tablet 9  . tamsulosin (FLOMAX) 0.4 MG CAPS capsule Take 0.4 mg by mouth daily after breakfast.     . umeclidinium-vilanterol (ANORO ELLIPTA) 62.5-25 MCG/INH AEPB Inhale 1 puff into the lungs daily as needed. For shortness of breath per patient    . warfarin (COUMADIN) 3 MG tablet Take as directed by coumadin clinic (Patient taking differently: Take as directed by coumadin clinic Takes 3mg  Sun and 4.5mg  all other days) 135 tablet 1   No current facility-administered medications on file prior to visit.    No Known Allergies  Past Medical History  Diagnosis Date  . Chronic atrial fibrillation ([place])   . Dilated cardiomyopathy ([place])   . PVC's (premature ventricular contractions)   . Hyperlipidemia   . COPD (chronic obstructive pulmonary disease) ([place])   . Hypothyroidism   . Renal calculi   . Chronic anticoagulation   . CHF (congestive heart failure) ([facility])     DUE TO SYSTOLIC DYSFUNCTION  . Shortness of breath     exertional  . Cancer ([facility])     vocal cord - radiation     Past Surgical History  Procedure Laterality Date  . Cardiac catheterization  02/26/2007  EF 35-40%  . Appendectomy    . Tonsillectomy and adenoidectomy    . US echocardiography  07/08/2010    EF 50-55%  . US echocardiography  02/16/2007    EF 25-35%  . Transthoracic echocardiogram  08/26/2001    EF 25-30%  . Cardiovascular stress test  02/18/2007    EF 40%  . Microlaryngoscopy with co2 laser and excision of vocal cord lesion      15 years ago  . Lithotripsy    . Inguinal hernia repair Right 12/28/2012    Procedure: HERNIA REPAIR INGUINAL ADULT;  Surgeon: Joyice Faster. Cornett, MD;  Location: [facility];  Service: General;  Laterality: Right;    History  Smoking status  . Former Smoker -- 1.00 packs/day for 35 years  . Types: Cigarettes   . Quit date: 07/11/1961  Smokeless tobacco  . Not on file    History  Alcohol Use No    Family History  Problem Relation Age of Onset  . Stroke Mother   . Seizures Mother   . Heart attack Father   . Heart failure Father   . Heart attack Brother     Review of Systems: As per history of present illness.  All other systems were reviewed and are negative.  Physical Exam: BP 110/70 mmHg  Pulse 74  Ht 5\' 8"  (1.727 m)  Wt 58.06 kg (128 lb)  BMI 19.47 kg/m2 He is a pleasant white male who appears chronically ill.  The HEENT exam is normal. The carotids are 2+ without bruits.  There is no thyromegaly.  There is no JVD.  The lungs reveal diminished BS with faint wheezes.   The heart exam reveals an irregular rate with a normal S1 and S2.  There are no murmurs, gallops, or rubs.  The PMI is not displaced.   Abdominal exam reveals good bowel sounds.  There is no hepatosplenomegaly or tenderness.    Exam of the legs no edema.  Pedal pulses are intact.  Cranial nerves II - XII are intact.  Motor and sensory functions are intact.  The gait is normal.  LABORATORY DATA: Lab Results  Component Value Date   WBC 10.5 12/23/2015   HGB 12.1* 12/23/2015   HCT 36.8* 12/23/2015   PLT 185 12/23/2015   GLUCOSE 130* 12/23/2015   CHOL 138 11/29/2014   TRIG 58 11/29/2014   HDL 36* 11/29/2014   LDLCALC 90 11/29/2014   ALT 20 12/21/2015   AST 41 12/21/2015   NA 138 12/23/2015   K 4.3 12/23/2015   CL 103 12/23/2015   CREATININE 0.95 12/23/2015   BUN 33* 12/23/2015   CO2 28 12/23/2015   TSH 0.728 12/22/2015   INR 4.33* 12/24/2015    Ecg from the hospital on 12/22/15 reviewed. Afib with frequent PVCs vs aberrancy. Low voltage. Old septal infarct. I have personally reviewed and interpreted this study.  Echo: 12/24/15:Study Conclusions  - Left ventricle: The cavity size was normal. Wall thickness was  increased in a pattern of mild LVH. Systolic function was normal.  The estimated ejection  fraction was in the range of 55% to 60%. - Left atrium: The atrium was moderately dilated. - Right ventricle: RV is not well seen to evalaute function. RVEF  may be normal to mildly decreased. - Right atrium: The atrium was moderately dilated. - Pulmonary arteries: PA peak pressure: 53 mm Hg (S).   Assessment / Plan: 1. Atrial fibrillation, permanent. Rate is well controlled today. We will continue on  his current dose of metoprolol.  Continue anticoagulation with Coumadin. INR was high in the hospital and he has follow up in the coumadin clinic.   2. History of dilated cardiomyopathy. In 2008 ejection fraction was 25-35%. Repeat echocardiogram in Jan 2015 showed EF of 50-55%. Repeat Echo on March 6 showed normal LV function with moderate Pulmonary HTN.  He will continue with metoprolol and ARB.   3. Chronic diastolic CHF. stable. Use lasix prn.  4. Hyperlipidemia. Continue simvastatin and fish oil.  5. Chronic respiratory failure s/p recent PNA in February.   Follow up with pulmonary- Dr. Melvyn Novas.  6. Pulmonary HTN mostly due to #5.  I will follow up in 6 months.

## 2015-12-27 LAB — CULTURE, BLOOD (ROUTINE X 2): CULTURE: NO GROWTH

## 2016-01-07 ENCOUNTER — Telehealth: Payer: Self-pay | Admitting: *Deleted

## 2016-01-07 ENCOUNTER — Ambulatory Visit (INDEPENDENT_AMBULATORY_CARE_PROVIDER_SITE_OTHER): Payer: Medicare Other | Admitting: *Deleted

## 2016-01-07 DIAGNOSIS — I482 Chronic atrial fibrillation, unspecified: Secondary | ICD-10-CM

## 2016-01-07 DIAGNOSIS — Z5181 Encounter for therapeutic drug level monitoring: Secondary | ICD-10-CM

## 2016-01-07 DIAGNOSIS — I4891 Unspecified atrial fibrillation: Secondary | ICD-10-CM

## 2016-01-07 LAB — PROTIME-INR
INR: 5.78 — AB (ref 0.00–1.49)
Prothrombin Time: 49.2 seconds — ABNORMAL HIGH (ref 11.6–15.2)

## 2016-01-07 LAB — POCT INR: INR: 8

## 2016-01-07 NOTE — Telephone Encounter (Signed)
Stat inr  5.8 called from lab   Sent message to [address]  to contact patient

## 2016-01-13 ENCOUNTER — Other Ambulatory Visit: Payer: Self-pay

## 2016-01-14 ENCOUNTER — Ambulatory Visit (INDEPENDENT_AMBULATORY_CARE_PROVIDER_SITE_OTHER): Payer: Medicare Other | Admitting: *Deleted

## 2016-01-14 DIAGNOSIS — I4891 Unspecified atrial fibrillation: Secondary | ICD-10-CM | POA: Diagnosis not present

## 2016-01-14 DIAGNOSIS — I482 Chronic atrial fibrillation, unspecified: Secondary | ICD-10-CM

## 2016-01-14 DIAGNOSIS — Z5181 Encounter for therapeutic drug level monitoring: Secondary | ICD-10-CM

## 2016-01-14 LAB — POCT INR: INR: 2.6

## 2016-01-19 ENCOUNTER — Other Ambulatory Visit: Payer: Self-pay | Admitting: Internal Medicine

## 2016-01-23 ENCOUNTER — Ambulatory Visit (INDEPENDENT_AMBULATORY_CARE_PROVIDER_SITE_OTHER): Payer: Medicare Other | Admitting: *Deleted

## 2016-01-23 DIAGNOSIS — I4891 Unspecified atrial fibrillation: Secondary | ICD-10-CM | POA: Diagnosis not present

## 2016-01-23 DIAGNOSIS — Z5181 Encounter for therapeutic drug level monitoring: Secondary | ICD-10-CM

## 2016-01-23 DIAGNOSIS — I482 Chronic atrial fibrillation, unspecified: Secondary | ICD-10-CM

## 2016-01-23 LAB — PROTIME-INR
INR: 4 — ABNORMAL HIGH (ref 0.00–1.49)
PROTHROMBIN TIME: 38 s — AB (ref 11.6–15.2)

## 2016-01-23 LAB — POCT INR: INR: 6.1

## 2016-02-01 ENCOUNTER — Ambulatory Visit (INDEPENDENT_AMBULATORY_CARE_PROVIDER_SITE_OTHER): Payer: Medicare Other | Admitting: *Deleted

## 2016-02-01 DIAGNOSIS — I4891 Unspecified atrial fibrillation: Secondary | ICD-10-CM | POA: Diagnosis not present

## 2016-02-01 DIAGNOSIS — I482 Chronic atrial fibrillation, unspecified: Secondary | ICD-10-CM

## 2016-02-01 DIAGNOSIS — Z5181 Encounter for therapeutic drug level monitoring: Secondary | ICD-10-CM

## 2016-02-01 LAB — POCT INR: INR: 2.5

## 2016-02-11 ENCOUNTER — Ambulatory Visit (INDEPENDENT_AMBULATORY_CARE_PROVIDER_SITE_OTHER)
Admission: RE | Admit: 2016-02-11 | Discharge: 2016-02-11 | Disposition: A | Discharge status: Home / Self Care | Payer: Medicare Other | Source: Ambulatory Visit | Attending: Internal Medicine | Admitting: Internal Medicine

## 2016-02-11 ENCOUNTER — Encounter: Payer: Self-pay | Admitting: Internal Medicine

## 2016-02-11 ENCOUNTER — Ambulatory Visit (INDEPENDENT_AMBULATORY_CARE_PROVIDER_SITE_OTHER): Payer: Medicare Other | Admitting: Internal Medicine

## 2016-02-11 ENCOUNTER — Ambulatory Visit (INDEPENDENT_AMBULATORY_CARE_PROVIDER_SITE_OTHER): Payer: Medicare Other | Admitting: *Deleted

## 2016-02-11 VITALS — BP 116/80 | HR 83 | Ht 68.0 in | Wt 123.4 lb

## 2016-02-11 DIAGNOSIS — I482 Chronic atrial fibrillation, unspecified: Secondary | ICD-10-CM

## 2016-02-11 DIAGNOSIS — Z5181 Encounter for therapeutic drug level monitoring: Secondary | ICD-10-CM | POA: Diagnosis not present

## 2016-02-11 DIAGNOSIS — J9612 Chronic respiratory failure with hypercapnia: Secondary | ICD-10-CM | POA: Diagnosis not present

## 2016-02-11 DIAGNOSIS — I4891 Unspecified atrial fibrillation: Secondary | ICD-10-CM | POA: Diagnosis not present

## 2016-02-11 DIAGNOSIS — J449 Chronic obstructive pulmonary disease, unspecified: Secondary | ICD-10-CM

## 2016-02-11 DIAGNOSIS — J9611 Chronic respiratory failure with hypoxia: Secondary | ICD-10-CM

## 2016-02-11 LAB — POCT INR: INR: 2.1

## 2016-02-11 NOTE — Progress Notes (Signed)
Subjective:    Patient ID: Logan [name], male    DOB: [birth year]   MRN: [id number]    Brief patient profile:  69 yowm quit smoking 1992 referred 11/24/2013 by Dr Arelia Sneddon to pulmonary clinic for eval of new resp failure dx as GOLD III/ IV with hypercapnia        02/07/2014 f/u ov/[name] re: chronic resp failure / GOLD III copd / cor pulmonale  Chief Complaint  Patient presents with  . Followup with PFT    Pt reports his breathing is doing well and denies any new co's today.   Not limited by breathing from desired activities  As long as on 02 2lpm / goal is to get off 02 completely rec Start Anoro take 2 puffs off one click each am Ok to leave off 02 at rest but wear it at 2lpm with acitivity and at bedtime Separating the top medications from the bottom group is fundamental to providing you adequate care going forward.        08/13/2015  f/u ov/[name] re: copd III on anoro and 02 2lpm at hs and prn daytime  Chief Complaint  Patient presents with  . Follow-up    Pt states his breathing overall doing well. He has not used rescue inhaler. Cough is unchanged.   doe = MMRC3 = can't walk 100 yards even at a slow pace at a flat grade s stopping due to sob  Has med calendar not really following action plan for cough/ reviewed. 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 Follow-up    Breathing is overall doing well. He states that he has not had to use proair. No new co's today.    doe baseline = MMRC3 = can't walk 100 yards even at a slow pace at a flat grade s stopping due to sob   No obvious day to day or daytime variability or assoc excess/ purulent sputum or mucus plugs    cp or chest tightness, subjective wheeze or overt sinus or hb symptoms. No unusual exp hx or h/o childhood pna/ asthma or knowledge of premature birth.  Sleeping ok without nocturnal  or early am exacerbation  of respiratory  c/o's or need for noct saba. Also denies any obvious fluctuation of symptoms with weather or environmental changes or other aggravating or alleviating factors except as outlined above   Current Medications, Allergies, Complete Past Medical History, Past Surgical History, Family History, and Social History were reviewed in [employer] record.  ROS  The following are not active complaints unless bolded sore throat, dysphagia, dental problems, itching, sneezing,  nasal  congestion or excess/ purulent secretions, ear ache,   fever, chills, sweats, unintended wt loss, classically pleuritic or exertional cp, hemoptysis,  orthopnea pnd or leg swelling, presyncope, palpitations, abdominal pain, anorexia, nausea, vomiting, diarrhea  or change in bowel or bladder habits, change in stools or urine, dysuria,hematuria,  rash, arthralgias, visual complaints, headache, numbness, weakness or ataxia or problems with walking or coordination,  change in mood/affect or memory.                 Objective:   Physical Exam  amb wm nad  Elderly and thin with rattling cough on FVC    12/21/2013 143>141 01/04/2014  > 02/07/2014  135 > 03/02/2014 137 > 03/06/2014  135 >130 04/04/2014 >  05/16/2014 132 > 08/15/2014   135 > 11/15/2014  133 >127 02/14/2015 > 08/13/2015  126 > 02/11/2016   123     HEENT mild turbinate edema.  Oropharynx no thrush or excess pnd or cobblestoning.  No JVD or cervical adenopathy. Mild accessory muscle hypertrophy. Trachea midline, nl thryroid. Chest was hyperinflated by percussion with diminished breath sounds and junky insp / exp rhonchi bilaterally. Regular rate and rhythm without murmur gallop or rub or increase P2 and No edema.  Abd: no hsm, nl excursion. Ext warm without cyanosis or clubbing.           CXR PA and Lateral:   02/11/2016 :    I personally reviewed images and agree with radiology impression as follows:    Hyperinflation and chronic interstitial thickening, likely related COPD/chronic bronchitis.  Trace bilateral pleural effusions.  Cardiomegaly and aortic atherosclerosis.         Assessment & Plan:

## 2016-02-11 NOTE — Progress Notes (Signed)
Quick Note:  Spoke with pt and notified of results per Dr. Wert. Pt verbalized understanding and denied any questions.  ______ 

## 2016-02-11 NOTE — Patient Instructions (Signed)
Please remember to go to the  x-ray department downstairs for your tests - we will call you with the results when they are available.  See calendar for specific medication instructions and bring it back for each and every office visit for every healthcare provider you see.  Without it,  you may not receive the best quality medical care that we feel you deserve.  You will note that the calendar groups together  your maintenance  medications that are timed at particular times of the day.  Think of this as your checklist for what your doctor has instructed you to do until your next evaluation to see what benefit  there is  to staying on a consistent group of medications intended to keep you well.  The other group at the bottom is entirely up to you to use as you see fit  for specific symptoms that may arise between visits that require you to treat them on an as needed basis.  Think of this as your action plan or "what if" list.   Separating the top medications from the bottom group is fundamental to providing you adequate care going forward.    See Tammy NP w/in 4 weeks with all your medications, even over the counter meds, separated in two separate bags, the ones you take no matter what vs the ones you stop once you feel better and take only as needed when you feel you need them.   Tammy  will generate for you a new user friendly medication calendar that will put [place] all on the same page re: your medication use.

## 2016-02-12 NOTE — Assessment & Plan Note (Addendum)
See admit 10/30/13 > d/c'd on 4lpm 24/7  - as of 12/21/2013 rec 4lpm 247 except fo 6 lpm with activity  - 02/07/2014 93% RA so rec 02 2lpm at hs and with activity - 03/02/2014 2 lpm 84% > corrected on 4lpm - 03/06/2014 HC03 36 c/w chronic hypercarbia as well - 05/16/2014  Walked 2lpm  x 3 laps @ 185 ft each stopped due to end of study moderate pace no desat - 08/15/2014   Walked RA x one lap @ 185 stopped due to sat 90% and nl pace  - 11/15/2014  Walked 2lpm x 3 laps @ 185 ft each stopped due to  End of study, erratic sats but with head probe did not desat on last one  - 02/11/2016 RA 94% at rest    rx as of  02/11/2016  use 02 2lpm with sleep and exertion but not at rest/ walking around the house or to restaurants/ churches etc where sits down on arrival

## 2016-02-12 NOTE — Assessment & Plan Note (Signed)
-   PFT's 02/07/14  FEV1  0.69 (31%) ratio 41 no change p B2 and DLCO 30% - Trial of anoro 02/07/2014 > no better - trial of spiriva/ striverdi 03/02/2014 > improved 03/06/2014 > changed to anoro per formulary 05/16/2014 > improved 08/15/2014  - 08/15/2014 p extensive coaching HFA effectiveness =    75%  -med calendar 02/14/2015   I had an extended discussion with the patient reviewing all relevant studies completed to date and  lasting 15 to 20 minutes of a 25 minute visit    Each maintenance medication was reviewed in detail including most importantly the difference between maintenance and prns and under what circumstances the prns are to be triggered using an action plan format that is not reflected in the computer generated alphabetically organized AVS but trather by a customized med calendar that reflects the AVS meds with confirmed 100% correlation.   Please see instructions for details which were reviewed in writing and the patient given a copy highlighting the part that I personally wrote and discussed at today's ov.

## 2016-03-03 ENCOUNTER — Ambulatory Visit (INDEPENDENT_AMBULATORY_CARE_PROVIDER_SITE_OTHER): Payer: Medicare Other | Admitting: *Deleted

## 2016-03-03 DIAGNOSIS — I4891 Unspecified atrial fibrillation: Secondary | ICD-10-CM | POA: Diagnosis not present

## 2016-03-03 DIAGNOSIS — Z5181 Encounter for therapeutic drug level monitoring: Secondary | ICD-10-CM

## 2016-03-03 DIAGNOSIS — I482 Chronic atrial fibrillation, unspecified: Secondary | ICD-10-CM

## 2016-03-03 LAB — POCT INR: INR: 2.2

## 2016-03-10 ENCOUNTER — Encounter: Payer: Medicare Other | Admitting: Adult Health

## 2016-03-11 ENCOUNTER — Encounter: Payer: Self-pay | Admitting: Adult Health

## 2016-03-11 ENCOUNTER — Ambulatory Visit (INDEPENDENT_AMBULATORY_CARE_PROVIDER_SITE_OTHER): Payer: Medicare Other | Admitting: Adult Health

## 2016-03-11 VITALS — BP 110/70 | HR 83 | Temp 97.5°F | Ht 68.0 in | Wt 121.0 lb

## 2016-03-11 DIAGNOSIS — J449 Chronic obstructive pulmonary disease, unspecified: Secondary | ICD-10-CM | POA: Diagnosis not present

## 2016-03-11 DIAGNOSIS — J9611 Chronic respiratory failure with hypoxia: Secondary | ICD-10-CM | POA: Diagnosis not present

## 2016-03-11 DIAGNOSIS — J9612 Chronic respiratory failure with hypercapnia: Secondary | ICD-10-CM | POA: Diagnosis not present

## 2016-03-11 NOTE — Assessment & Plan Note (Signed)
Compensated on present regimen  Plan  Follow medication calendar closely and bring to each visit. Follow up Dr. Melvyn Novas  In 6 months and As needed   Please contact office for sooner follow up if symptoms do not improve or worsen or seek emergency care

## 2016-03-11 NOTE — Progress Notes (Signed)
Subjective:    Patient ID: Logan [name], male    DOB: 11/24/1924   MRN: [id number]    Brief patient profile:  39 yowm quit smoking 1992 referred 11/24/2013 by Dr Arelia Sneddon to pulmonary clinic for eval of new resp failure dx as GOLD III/ IV with hypercapnia        02/07/2014 f/u ov/Wert re: chronic resp failure / GOLD III copd / cor pulmonale  Chief Complaint  Patient presents with  . Followup with PFT    Pt reports his breathing is doing well and denies any new co's today.   Not limited by breathing from desired activities  As long as on 02 2lpm / goal is to get off 02 completely rec Start Anoro take 2 puffs off one click each am Ok to leave off 02 at rest but wear it at 2lpm with acitivity and at bedtime Separating the top medications from the bottom group is fundamental to providing you adequate care going forward.        08/13/2015  f/u ov/Wert re: copd III on anoro and 02 2lpm at hs and prn daytime  Chief Complaint  Patient presents with  . Follow-up    Pt states his breathing overall doing well. He has not used rescue inhaler. Cough is unchanged.   doe = MMRC3 = can't walk 100 yards even at a slow pace at a flat grade s stopping due to sob  Has med calendar not really following action plan for cough/ reviewed. 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 Follow-up    Breathing is overall doing well. He states that he has not had to use proair. No new co's today.    doe baseline = MMRC3 = can't walk 100 yards even at a slow pace at a flat grade s stopping due to sob   No obvious day to day or daytime variability or assoc excess/ purulent sputum or mucus plugs    cp or chest tightness, subjective wheeze or overt sinus or hb symptoms. No unusual exp hx or h/o childhood pna/ asthma or knowledge of premature birth. >Chest x-ray showed chronic changes  03/11/2016 Follow up : COPD III/IV and O2 dependent Patient returns for a one-month follow-up. We reviewed  all his medications organize them into a medication count with patient education Appears to be taking his medications correctly  He says overall his breathing is doing okay. He remains on ANORO .  He denies flare of cough or wheezing  PVX and Prevnar are utd.  CXR last ov with chronic changes    Current Medications, Allergies, Complete Past Medical  History, Past Surgical History, Family History, and Social History were reviewed in [employer] record.  ROS  The following are not active complaints unless bolded sore throat, dysphagia, dental problems, itching, sneezing,  nasal congestion or excess/ purulent secretions, ear ache,   fever, chills, sweats, unintended wt loss, classically pleuritic or exertional cp, hemoptysis,  orthopnea pnd or leg swelling, presyncope, palpitations, abdominal pain, anorexia, nausea, vomiting, diarrhea  or change in bowel or bladder habits, change in stools or urine, dysuria,hematuria,  rash, arthralgias, visual complaints, headache, numbness, weakness or  ataxia or problems with walking or coordination,  change in mood/affect or memory.                 Objective:   Physical Exam  amb wm nad  Elderly and thin  Filed Vitals:   03/11/16 1003  BP: 110/70  Pulse: 83  Temp: 97.5 F (36.4 C)  TempSrc: Oral  Height: 5\' 8"  (1.727 m)  Weight: 121 lb (54.885 kg)  SpO2: 94%     12/21/2013 143>141 01/04/2014  > 02/07/2014  135 > 03/02/2014 137 > 03/06/2014  135 >130 04/04/2014 >  05/16/2014 132 > 08/15/2014   135 > 11/15/2014  133 >127 02/14/2015 > 08/13/2015  126 > 02/11/2016   123     HEENT mild turbinate edema.  Oropharynx no thrush or excess pnd or cobblestoning.  No JVD or cervical adenopathy. Mild accessory muscle hypertrophy. Trachea midline, nl thryroid. Chest was hyperinflated by percussion with diminished breath sounds  Regular rate and rhythm without murmur gallop or rub or increase P2 and No edema.  Abd: no hsm, nl excursion. Ext warm without cyanosis or clubbing.           CXR PA and Lateral:   02/11/2016 :     Hyperinflation and chronic interstitial thickening, likely related COPD/chronic bronchitis.  Trace bilateral pleural effusions.  Cardiomegaly and aortic atherosclerosis.    Maxmilian Trostel NP-C  [facility]  03/11/2016      Assessment & Plan:

## 2016-03-11 NOTE — Assessment & Plan Note (Signed)
Cont on O2  Follow medication calendar closely and bring to each visit. Follow up Dr. Melvyn Novas  In 6 months and As needed   Please contact office for sooner follow up if symptoms do not improve or worsen or seek emergency care

## 2016-03-11 NOTE — Progress Notes (Signed)
Chart and office note reviewed in detail  > agree with a/p as outlined    

## 2016-03-11 NOTE — Patient Instructions (Signed)
Follow medication calendar closely and bring to each visit. Follow up Dr. Wert  In 6 months and As needed   Please contact office for sooner follow up if symptoms do not improve or worsen or seek emergency care   

## 2016-03-14 NOTE — Addendum Note (Signed)
Addended by: Osa Craver on: 03/14/2016 05:04 PM   Modules accepted: Medications

## 2016-03-25 ENCOUNTER — Telehealth: Payer: Self-pay | Admitting: Cardiology

## 2016-03-25 MED ORDER — SIMVASTATIN 80 MG PO TABS: 80.0000 mg | ORAL_TABLET | Freq: Every day | ORAL | Status: DC

## 2016-03-25 NOTE — Telephone Encounter (Signed)
New message       *STAT* If patient is at the pharmacy, call can be transferred to refill team.   1. Which medications need to be refilled? (please list name of each medication and dose if known) simvastatin 80mg   2. Which pharmacy/location (including street and city if local pharmacy) is medication to be sent to? [email or web address] 3. Do they need a 30 day or 90 day supply? [place]

## 2016-03-25 NOTE — Telephone Encounter (Signed)
Refilled as requested  

## 2016-03-25 NOTE — Telephone Encounter (Signed)
Spoke with patient and he only wants to see Dr Martinique Advised he had no appointments but would forward to his nurse  Will forward to [facility] LPN

## 2016-03-25 NOTE — Telephone Encounter (Signed)
New Message  Pt received recall letter for Sept- pt was told that there are no available appointments for Dr Martinique- Pt was offered appt w/ APP- pt refused,. Pt was made aware that the October schedule is unavailable at this time but he can call back at another time to sched in October. Pt rrefused and requested to speak w/ RN. Please call back and discuss.

## 2016-03-26 NOTE — Telephone Encounter (Signed)
Returned call to patient.Follow up appointment scheduled with Dr.Jordan 07/08/16 at 9:45 am.

## 2016-03-31 ENCOUNTER — Ambulatory Visit (INDEPENDENT_AMBULATORY_CARE_PROVIDER_SITE_OTHER): Payer: Medicare Other

## 2016-03-31 DIAGNOSIS — I482 Chronic atrial fibrillation, unspecified: Secondary | ICD-10-CM

## 2016-03-31 DIAGNOSIS — Z5181 Encounter for therapeutic drug level monitoring: Secondary | ICD-10-CM | POA: Diagnosis not present

## 2016-03-31 DIAGNOSIS — I4891 Unspecified atrial fibrillation: Secondary | ICD-10-CM

## 2016-03-31 LAB — POCT INR: INR: 3

## 2016-05-05 ENCOUNTER — Ambulatory Visit (INDEPENDENT_AMBULATORY_CARE_PROVIDER_SITE_OTHER): Payer: Medicare Other

## 2016-05-05 DIAGNOSIS — Z5181 Encounter for therapeutic drug level monitoring: Secondary | ICD-10-CM | POA: Diagnosis not present

## 2016-05-05 DIAGNOSIS — I482 Chronic atrial fibrillation, unspecified: Secondary | ICD-10-CM

## 2016-05-05 DIAGNOSIS — I4891 Unspecified atrial fibrillation: Secondary | ICD-10-CM | POA: Diagnosis not present

## 2016-05-05 LAB — POCT INR: INR: 1.7

## 2016-06-02 ENCOUNTER — Ambulatory Visit (INDEPENDENT_AMBULATORY_CARE_PROVIDER_SITE_OTHER): Payer: Medicare Other | Admitting: *Deleted

## 2016-06-02 DIAGNOSIS — Z5181 Encounter for therapeutic drug level monitoring: Secondary | ICD-10-CM | POA: Diagnosis not present

## 2016-06-02 DIAGNOSIS — I4891 Unspecified atrial fibrillation: Secondary | ICD-10-CM

## 2016-06-02 LAB — POCT INR: INR: 2.3

## 2016-06-06 ENCOUNTER — Telehealth: Payer: Self-pay | Admitting: Cardiology

## 2016-06-06 NOTE — Telephone Encounter (Signed)
New Message  Pt c/o medication issue:  1. Name of Medication: Simvastation..... Digoxin.... Warfarin  2. How are you currently taking this medication (dosage and times per day)? 80mg .......0.125mg ...... 3mg   3. Are you having a reaction (difficulty breathing--STAT)? Pet states he has been experiencing nausea, headaches, and  fatigue   4. What is your medication issue? Pt states he is not sure which med is causing the reaction. He would like a call back for further instruction. Please call back to discuss

## 2016-06-06 NOTE — Telephone Encounter (Signed)
Returned call to patient.He stated he got a letter from his pharmacist today when he picked up his medications.Stated there is a contraindication with simvastatin,digoxin,warfarin.Also stated he is losing weight since he has been taking medications.Stated he is weighing 120 lbs use to weigh 150 lbs.Stated he is weak.Stated he eats all he can but still losing weight.Advised to continue medications.Try drinking Boost in between meals.Advised I will speak to Dr.Jordan on Mon 06/09/16 and call you back.

## 2016-06-09 NOTE — Telephone Encounter (Signed)
Returned call to patient spoke to Logan [name] he advised to stop simvastatin.Advised to keep appointment with Dr.Jordan 07/08/16 at 9:45 am.Advised to call sooner if needed.

## 2016-06-24 ENCOUNTER — Encounter: Payer: Self-pay | Admitting: Cardiology

## 2016-06-25 ENCOUNTER — Ambulatory Visit: Payer: Medicare Other | Admitting: Cardiology

## 2016-07-07 ENCOUNTER — Other Ambulatory Visit: Payer: Self-pay | Admitting: Cardiology

## 2016-07-08 ENCOUNTER — Ambulatory Visit (INDEPENDENT_AMBULATORY_CARE_PROVIDER_SITE_OTHER): Payer: Medicare Other | Admitting: Pharmacist

## 2016-07-08 ENCOUNTER — Encounter: Payer: Self-pay | Admitting: Cardiology

## 2016-07-08 ENCOUNTER — Ambulatory Visit (INDEPENDENT_AMBULATORY_CARE_PROVIDER_SITE_OTHER): Payer: Medicare Other | Admitting: Cardiology

## 2016-07-08 VITALS — BP 120/60 | HR 74 | Ht 68.0 in | Wt 120.0 lb

## 2016-07-08 DIAGNOSIS — I482 Chronic atrial fibrillation, unspecified: Secondary | ICD-10-CM

## 2016-07-08 DIAGNOSIS — I420 Dilated cardiomyopathy: Secondary | ICD-10-CM

## 2016-07-08 DIAGNOSIS — I5032 Chronic diastolic (congestive) heart failure: Secondary | ICD-10-CM

## 2016-07-08 DIAGNOSIS — I4891 Unspecified atrial fibrillation: Secondary | ICD-10-CM

## 2016-07-08 DIAGNOSIS — E785 Hyperlipidemia, unspecified: Secondary | ICD-10-CM | POA: Diagnosis not present

## 2016-07-08 DIAGNOSIS — Z5181 Encounter for therapeutic drug level monitoring: Secondary | ICD-10-CM

## 2016-07-08 DIAGNOSIS — I493 Ventricular premature depolarization: Secondary | ICD-10-CM

## 2016-07-08 LAB — POCT INR: INR: 1.8

## 2016-07-08 NOTE — Patient Instructions (Addendum)
Continue your current therapy  You can take Zantac or prilosec OTC at night for your stomach upset.  I will see you in 6 months with lab work

## 2016-07-08 NOTE — Progress Notes (Signed)
Logan [name] Date of Birth: 05-Mar-1925   History of Present Illness: Logan [name] is seen for followup Afib and CHF. He has a history of permanent atrial fibrillation managed with rate control and anticoagulation. He also has chronic diastolic heart failure with last ejection fraction of 55-60%.    Appetite is better. He is taking Boost.  No chest pain or edema.  Breathing is better. Still using oxygen at night. Notes he belches a lot in the morning. He is able to do some shopping and house work.    Current Outpatient Prescriptions on File Prior to Visit  Medication Sig Dispense Refill  . Calcium-Magnesium-Zinc (CAL-MAG-ZINC PO) Take 1 tablet by mouth every morning.     Marland Kitchen dextromethorphan-guaiFENesin (MUCINEX DM) 30-600 MG per 12 hr tablet Take 1-2 tablets by mouth every 12 (twelve) hours as needed (cough, congestion).    Marland Kitchen digoxin (LANOXIN) 0.125 MG tablet Take 1 tablet (0.125 mg total) by mouth daily. 30 tablet 11  . furosemide (LASIX) 40 MG tablet Take 40 mg by mouth daily. May take 1 extra daily as needed for leg swelling    . levothyroxine (SYNTHROID, LEVOTHROID) 75 MCG tablet Take 1 tablet (75 mcg total) by mouth daily. 30 tablet 2  . losartan (COZAAR) 25 MG tablet Take 25 mg by mouth daily.     . Multiple Vitamin (MULTIVITAMIN) tablet Take 1 tablet by mouth daily.     . OXYGEN Use 2L at bedtime and have with activity for shortness of breath    . PROAIR HFA 108 (90 BASE) MCG/ACT inhaler Inhale 2 puffs into the lungs every 4 (four) hours as needed for wheezing or shortness of breath.     . tamsulosin (FLOMAX) 0.4 MG CAPS capsule Take 0.4 mg by mouth daily after breakfast.     . umeclidinium-vilanterol (ANORO ELLIPTA) 62.5-25 MCG/INH AEPB Inhale 1 puff into the lungs daily as needed. For shortness of breath per patient    . warfarin (COUMADIN) 3 MG tablet Take 1 to 1.5 tablets daily as directed by coumadin clinic 135 tablet 1   No current facility-administered medications on file  prior to visit.     No Known Allergies  Past Medical History:  Diagnosis Date  . 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 CHF (congestive heart failure) (HCC)    DUE TO SYSTOLIC DYSFUNCTION  . Chronic anticoagulation   . Chronic atrial fibrillation ([place])   . COPD (chronic obstructive pulmonary disease) ([place])   . Dilated cardiomyopathy ([place])   . Hyperlipidemia   . Hypothyroidism   . PVC's (premature ventricular contractions)   . Renal calculi   . Shortness of breath    exertional    Past Surgical History:  Procedure Laterality Date  . APPENDECTOMY    . CARDIAC CATHETERIZATION  02/26/2007   EF 35-40%  . CARDIOVASCULAR STRESS TEST  02/18/2007   EF 40%  . INGUINAL HERNIA REPAIR Right 12/28/2012   Procedure: HERNIA REPAIR INGUINAL ADULT;  Surgeon: Joyice Faster. Cornett, MD;  Location: [facility];  Service: General;  Laterality: Right;  . LITHOTRIPSY    . MICROLARYNGOSCOPY WITH CO2 LASER AND EXCISION OF VOCAL CORD LESION     15 years ago  . TONSILLECTOMY AND ADENOIDECTOMY    . TRANSTHORACIC ECHOCARDIOGRAM  08/26/2001   EF 25-30%  . US ECHOCARDIOGRAPHY  07/08/2010   EF 50-55%  . US ECHOCARDIOGRAPHY  02/16/2007   EF 25-35%    History  Smoking Status  .  Former Smoker  . Packs/day: 1.00  . Years: 35.00  . Types: Cigarettes  . Quit date: 07/11/1961  Smokeless Tobacco  . Never Used    History  Alcohol Use No    Family History  Problem Relation Age of Onset  . Stroke Mother   . Seizures Mother   . Heart attack Father   . Heart failure Father   . Heart attack Brother     Review of Systems: As per history of present illness.  All other systems were reviewed and are negative.  Physical Exam: BP 120/60 (BP Location: Right Arm, Patient Position: Sitting, Cuff Size: Normal)   Pulse 74   Ht 5\' 8"  (1.727 m)   Wt 120 lb (54.4 kg)   BMI 18.25 kg/m  He is a pleasant white male in NAD.  The HEENT exam is normal. The carotids are 2+ without bruits.  There is no thyromegaly.   There is no JVD.  The lungs reveal diminished BS with faint rhonchi.   The heart exam reveals an irregular rate with a normal S1 and S2.  There are no murmurs, gallops, or rubs.  The PMI is not displaced.   Abdominal exam reveals good bowel sounds.  There is no hepatosplenomegaly or tenderness.    Exam of the legs no edema.  Pedal pulses are intact.  Cranial nerves II - XII are intact.  Motor and sensory functions are intact.  The gait is normal.  LABORATORY DATA: Lab Results  Component Value Date   WBC 10.5 12/23/2015   HGB 12.1 (L) 12/23/2015   HCT 36.8 (L) 12/23/2015   PLT 185 12/23/2015   GLUCOSE 130 (H) 12/23/2015   CHOL 138 11/29/2014   TRIG 58 11/29/2014   HDL 36 (L) 11/29/2014   LDLCALC 90 11/29/2014   ALT 20 12/21/2015   AST 41 12/21/2015   NA 138 12/23/2015   K 4.3 12/23/2015   CL 103 12/23/2015   CREATININE 0.95 12/23/2015   BUN 33 (H) 12/23/2015   CO2 28 12/23/2015   TSH 0.728 12/22/2015   INR 1.8 07/08/2016    Echo: 12/24/15:Study Conclusions  - Left ventricle: The cavity size was normal. Wall thickness was  increased in a pattern of mild LVH. Systolic function was normal.  The estimated ejection fraction was in the range of 55% to 60%. - Left atrium: The atrium was moderately dilated. - Right ventricle: RV is not well seen to evalaute function. RVEF  may be normal to mildly decreased. - Right atrium: The atrium was moderately dilated. - Pulmonary arteries: PA peak pressure: 53 mm Hg (S).   Assessment / Plan: 1. Atrial fibrillation, permanent. Rate is well controlled today. We will continue on his current dose of metoprolol.  Dig level in March was 0.5. Continue anticoagulation with Coumadin. INR 1.8 today and adjusted by pharmacy.   2. History of dilated cardiomyopathy.  Repeat Echo in March 2017 showed normal LV function with moderate Pulmonary HTN.  He will continue with metoprolol and ARB.   3. Chronic diastolic CHF. stable. Use lasix prn.  4.  Hyperlipidemia. Continue simvastatin and fish oil.  5. Chronic respiratory failure  Follow up with pulmonary- Dr. Melvyn Novas.  6. Pulmonary HTN mostly due to #5.  I will follow up in 6 months with chemistries, lipids, and Dig level.

## 2016-07-22 ENCOUNTER — Other Ambulatory Visit: Payer: Self-pay | Admitting: Cardiology

## 2016-07-28 ENCOUNTER — Ambulatory Visit (INDEPENDENT_AMBULATORY_CARE_PROVIDER_SITE_OTHER): Payer: Medicare Other | Admitting: Pharmacist Clinician (PhC)/ Clinical Pharmacy Specialist

## 2016-07-28 DIAGNOSIS — Z5181 Encounter for therapeutic drug level monitoring: Secondary | ICD-10-CM | POA: Diagnosis not present

## 2016-07-28 DIAGNOSIS — I4891 Unspecified atrial fibrillation: Secondary | ICD-10-CM

## 2016-07-28 LAB — POCT INR: INR: 1.8

## 2016-08-11 ENCOUNTER — Ambulatory Visit (INDEPENDENT_AMBULATORY_CARE_PROVIDER_SITE_OTHER): Payer: Medicare Other | Admitting: Pharmacist

## 2016-08-11 DIAGNOSIS — Z5181 Encounter for therapeutic drug level monitoring: Secondary | ICD-10-CM

## 2016-08-11 DIAGNOSIS — I4891 Unspecified atrial fibrillation: Secondary | ICD-10-CM

## 2016-08-11 LAB — POCT INR: INR: 2.6

## 2016-09-01 ENCOUNTER — Ambulatory Visit (INDEPENDENT_AMBULATORY_CARE_PROVIDER_SITE_OTHER): Payer: Medicare Other | Admitting: Pharmacist Clinician (PhC)/ Clinical Pharmacy Specialist

## 2016-09-01 DIAGNOSIS — Z5181 Encounter for therapeutic drug level monitoring: Secondary | ICD-10-CM | POA: Diagnosis not present

## 2016-09-01 DIAGNOSIS — I4891 Unspecified atrial fibrillation: Secondary | ICD-10-CM | POA: Diagnosis not present

## 2016-09-01 LAB — POCT INR: INR: 2.6

## 2016-09-03 ENCOUNTER — Other Ambulatory Visit: Payer: Self-pay | Admitting: Cardiology

## 2016-09-15 ENCOUNTER — Encounter: Payer: Self-pay | Admitting: Internal Medicine

## 2016-09-15 ENCOUNTER — Ambulatory Visit (INDEPENDENT_AMBULATORY_CARE_PROVIDER_SITE_OTHER): Payer: Medicare Other | Admitting: Internal Medicine

## 2016-09-15 VITALS — BP 106/60 | HR 76 | Ht 67.0 in | Wt 124.2 lb

## 2016-09-15 DIAGNOSIS — J9611 Chronic respiratory failure with hypoxia: Secondary | ICD-10-CM

## 2016-09-15 DIAGNOSIS — J9612 Chronic respiratory failure with hypercapnia: Secondary | ICD-10-CM

## 2016-09-15 DIAGNOSIS — J449 Chronic obstructive pulmonary disease, unspecified: Secondary | ICD-10-CM | POA: Diagnosis not present

## 2016-09-15 MED ORDER — FLUTICASONE-UMECLIDIN-VILANT 100-62.5-25 MCG/INH IN AEPB: 1.0000 | INHALATION_SPRAY | Freq: Every day | RESPIRATORY_TRACT | Status: DC

## 2016-09-15 NOTE — Progress Notes (Signed)
Subjective:    Patient ID: Logan [name], male    DOB: 1925/07/09   MRN: [id number]    Brief patient profile:  63 yowm quit smoking 1992 referred 11/24/2013 by Dr Arelia Sneddon to pulmonary clinic for eval of new resp failure dx as GOLD III/ IV with hypercapnia        History of Present Illness  02/11/2016  f/u ov/Wert re: COPD GOLD III/IV -  02 dep hs and exertion/ no longer taking any metaprolol "ran out and didn't refill" / maint  On anoro  Chief Complaint  Patient presents with  . Follow-up    Breathing is overall doing well. He states that he has not had to use proair. No new co's today.    doe baseline = MMRC3 = can't walk 100 yards even at a slow pace at a flat grade s stopping due to sob   No obvious day to day or daytime variability or assoc excess/ purulent sputum or mucus plugs    cp or chest tightness, subjective wheeze or overt sinus or hb symptoms. No unusual exp hx or h/o childhood pna/ asthma or knowledge of premature birth. >Chest x-ray showed chronic changes  5/23/2017NP  Follow up : COPD III/IV and O2 dependent Patient returns for a one-month follow-up. We reviewed  all his medications organize them into a medication count with patient education Appears to be taking his medications correctly He says overall his breathing is doing okay. He remains on ANORO .  rec No change rx/ follow med calendar     09/15/2016  f/u ov/Wert re: COPD GOLD III/IV -  02 dep hs and exertion Chief Complaint  Patient presents with  . Follow-up    Wants to discuss stopping Anoro "I don't think it's doing me any good". He feels like his breathing is doing ok. His cough is occ prod with cloudy sputum.     Anoro is costing him 36 dollars and doesn't think it works/ never using saba/ struggling to follow med calendar  Doe still = MMRC3  Rarely using saba   No obvious day to day or daytime variability or assoc   mucus plugs or hemoptysis or cp or chest tightness, subjective wheeze or overt  sinus or hb symptoms. No unusual exp hx or h/o childhood pna/ asthma or knowledge of premature birth.  Sleeping ok without nocturnal  or early am exacerbation  of respiratory  c/o's or need for noct saba. Also denies any obvious fluctuation of symptoms with weather or environmental changes or other aggravating or alleviating factors except as outlined above   Current Medications, Allergies, Complete Past Medical History, Past Surgical History, Family History, and Social History were reviewed in [employer] record.  ROS  The following are not active complaints unless bolded sore throat, dysphagia, dental problems, itching, sneezing,  nasal congestion or excess/ purulent secretions, ear ache,   fever, chills, sweats, unintended wt loss, classically pleuritic or exertional cp,  orthopnea pnd or leg swelling, presyncope, palpitations, abdominal pain, anorexia, nausea, vomiting, diarrhea  or change in bowel or bladder habits, change in stools or urine, dysuria,hematuria,  rash, arthralgias, visual complaints, headache, numbness, weakness or ataxia or problems with walking or coordination,  change in mood/affect or memory.                      Objective:   Physical Exam  amb wm nad  Elderly and thin nad   Vital signs reviewed -  Note on arrival 02 sats  96% on 2lpm     12/21/2013 143>141 01/04/2014  > 02/07/2014  135 > 03/02/2014 137 > 03/06/2014  135 >130 04/04/2014 >  05/16/2014 132 > 08/15/2014   135 > 11/15/2014  133 >127 02/14/2015 > 08/13/2015  126 > 02/11/2016   123 > 09/15/2016    124     HEENT mild turbinate edema.  Oropharynx no thrush or excess pnd or cobblestoning.  No JVD or cervical adenopathy. Mild accessory muscle hypertrophy. Trachea midline, nl thryroid. Chest was hyperinflated by percussion with diminished breath sounds bilaterally.  Regular rate and rhythm without murmur gallop or rub or increase P2 and No edema.  Abd: no hsm, nl excursion. Ext warm without  cyanosis or clubbing.                  Assessment & Plan:

## 2016-09-15 NOTE — Patient Instructions (Addendum)
Plan A = Automatic =  ANORO OR TRELOGY one click each am and if not convinced you are better   Plan B = Backup Only use your albuterol as a rescue medication to be used if you can't catch your breath by resting or doing a relaxed purse lip breathing pattern.  - The less you use it, the better it will work when you need it. - Ok to use the inhaler up to 2 puffs  every 4 hours if you must but call for appointment if use goes up over your usual need - Don't leave home without it !!  (think of it like the spare tire for your car)     If not satisfied return to see Tammy NP with your fomulary and all active medications in hand

## 2016-09-17 ENCOUNTER — Encounter: Payer: Self-pay | Admitting: Internal Medicine

## 2016-09-17 NOTE — Assessment & Plan Note (Signed)
See admit 10/30/13 > d/c'd on 4lpm 24/7  - as of 12/21/2013 rec 4lpm 247 except fo 6 lpm with activity  - 02/07/2014 93% RA so rec 02 2lpm at hs and with activity - 03/02/2014 2 lpm 84% > corrected on 4lpm - 03/06/2014 HC03 36 c/w chronic hypercarbia as well - 05/16/2014  Walked 2lpm  x 3 laps @ 185 ft each stopped due to end of study moderate pace no desat - 08/15/2014   Walked RA x one lap @ 185 stopped due to sat 90% and nl pace  - 11/15/2014  Walked 2lpm x 3 laps @ 185 ft each stopped due to  End of study, erratic sats but with head probe did not desat on last one - 02/11/2016 RA 94% at rest     Adequate control on present rx, reviewed in detail with pt > no change in rx needed  =  rx as of  09/15/2016  use 02 2lpm with sleep and exertion but not necessarily  at rest/ walking around the house

## 2016-09-17 NOTE — Assessment & Plan Note (Signed)
-   PFT's 02/07/14  FEV1  0.69 (31%) ratio 41 no change p B2 and DLCO 30% - Trial of anoro 02/07/2014 > no better - trial of spiriva/ striverdi 03/02/2014 > improved 03/06/2014 > changed to anoro per formulary 05/16/2014 > improved 08/15/2014  - 08/15/2014 p extensive coaching HFA effectiveness =    75%  -med calendar 02/14/2015 , 03/11/2016  - 09/15/2016  After extensive coaching device  effectiveness =   100% with dpi/elipta    Group D in terms of symptom/risk and laba/lama/ICS  therefore appropriate rx at this point > try trelegy the new triple rx and if makes a difference return for breo/incruse (the equivalent if insurance reimbursement an issue  I had an extended discussion with the patient reviewing all relevant studies completed to date and  lasting 15 to 20 minutes of a 25 minute visit on the following ongoing concerns:   Formulary restrictions will be an ongoing challenge for the forseable future and I would be happy to pick an alternative if the pt will first  provide me a list of them but pt  will need to return here for training for any new device that is required eg dpi vs hfa vs respimat.    In meantime we can always provide samples so the patient never runs out of any needed respiratory medications.   Each maintenance medication was reviewed in detail including most importantly the difference between maintenance and as needed and under what circumstances the prns are to be used.  Please see instructions in AVS for details which were reviewed in writing and the patient given a copy.

## 2016-09-24 ENCOUNTER — Telehealth: Payer: Self-pay | Admitting: Internal Medicine

## 2016-09-24 DIAGNOSIS — J449 Chronic obstructive pulmonary disease, unspecified: Secondary | ICD-10-CM

## 2016-09-24 MED ORDER — FLUTICASONE-UMECLIDIN-VILANT 100-62.5-25 MCG/INH IN AEPB: 1.0000 | INHALATION_SPRAY | Freq: Every day | RESPIRATORY_TRACT | 5 refills | Status: AC

## 2016-09-24 NOTE — Telephone Encounter (Signed)
Spoke with pt. He would like a prescription sent to [facility] for Trelegy. Pt was given a sample of this and feels that it's helping. He called his insurance and it will only cost him $21 per 30 day supply. Rx has been sent in. Nothing further was needed.

## 2016-09-25 ENCOUNTER — Other Ambulatory Visit: Payer: Self-pay | Admitting: Internal Medicine

## 2016-09-29 ENCOUNTER — Ambulatory Visit (INDEPENDENT_AMBULATORY_CARE_PROVIDER_SITE_OTHER): Payer: Medicare Other | Admitting: Pharmacist Clinician (PhC)/ Clinical Pharmacy Specialist

## 2016-09-29 DIAGNOSIS — I4891 Unspecified atrial fibrillation: Secondary | ICD-10-CM | POA: Diagnosis not present

## 2016-09-29 DIAGNOSIS — Z5181 Encounter for therapeutic drug level monitoring: Secondary | ICD-10-CM | POA: Diagnosis not present

## 2016-09-29 LAB — POCT INR: INR: 2.1

## 2016-10-03 ENCOUNTER — Telehealth: Payer: Self-pay | Admitting: Internal Medicine

## 2016-10-03 NOTE — Telephone Encounter (Signed)
PA request received for Trelegy. Called [facility] at [phone] to initiate PA. PA is currently in review, a determination to be made within the next 72 hours.    Will forward to [place] to follow up on.

## 2016-10-06 NOTE — Telephone Encounter (Signed)
I received letter that the Trellegy Ellipta was approved for non-formulary exemption through 10/19/17  I spoke with pharmacist at [facility] and notified of approval

## 2016-10-22 ENCOUNTER — Other Ambulatory Visit: Payer: Self-pay | Admitting: Cardiology

## 2016-10-22 NOTE — Telephone Encounter (Signed)
REFILL 

## 2016-10-23 ENCOUNTER — Other Ambulatory Visit: Payer: Self-pay | Admitting: Internal Medicine

## 2016-10-27 ENCOUNTER — Ambulatory Visit (INDEPENDENT_AMBULATORY_CARE_PROVIDER_SITE_OTHER): Payer: Medicare Other | Admitting: Pharmacist Clinician (PhC)/ Clinical Pharmacy Specialist

## 2016-10-27 DIAGNOSIS — Z5181 Encounter for therapeutic drug level monitoring: Secondary | ICD-10-CM | POA: Diagnosis not present

## 2016-10-27 DIAGNOSIS — I4891 Unspecified atrial fibrillation: Secondary | ICD-10-CM | POA: Diagnosis not present

## 2016-10-27 LAB — POCT INR: INR: 2.4

## 2016-12-08 ENCOUNTER — Ambulatory Visit (INDEPENDENT_AMBULATORY_CARE_PROVIDER_SITE_OTHER): Payer: Medicare Other | Admitting: Pharmacist Clinician (PhC)/ Clinical Pharmacy Specialist

## 2016-12-08 DIAGNOSIS — Z5181 Encounter for therapeutic drug level monitoring: Secondary | ICD-10-CM | POA: Diagnosis not present

## 2016-12-08 DIAGNOSIS — I4891 Unspecified atrial fibrillation: Secondary | ICD-10-CM

## 2016-12-08 LAB — POCT INR: INR: 2.1

## 2016-12-16 LAB — LIPID PANEL
CHOLESTEROL: 157 mg/dL (ref <200)
HDL: 52 mg/dL (ref >40)
LDL Cholesterol: 91 mg/dL (ref <100)
TRIGLYCERIDES: 69 mg/dL (ref <150)
Total CHOL/HDL Ratio: 3 Ratio (ref <5.0)
VLDL: 14 mg/dL (ref <30)

## 2016-12-16 LAB — HEPATIC FUNCTION PANEL
ALK PHOS: 77 U/L (ref 40–115)
ALT: 15 U/L (ref 9–46)
AST: 22 U/L (ref 10–35)
Albumin: 3.8 g/dL (ref 3.6–5.1)
BILIRUBIN DIRECT: 0.2 mg/dL (ref <=0.2)
BILIRUBIN INDIRECT: 0.6 mg/dL (ref 0.2–1.2)
TOTAL PROTEIN: 7 g/dL (ref 6.1–8.1)
Total Bilirubin: 0.8 mg/dL (ref 0.2–1.2)

## 2016-12-16 LAB — DIGOXIN LEVEL: Digoxin Level: 0.6 ug/L — ABNORMAL LOW (ref 0.8–2.0)

## 2016-12-16 LAB — BASIC METABOLIC PANEL
BUN: 16 mg/dL (ref 7–25)
CHLORIDE: 100 mmol/L (ref 98–110)
CO2: 33 mmol/L — AB (ref 20–31)
Calcium: 9.2 mg/dL (ref 8.6–10.3)
Creat: 0.93 mg/dL (ref 0.70–1.11)
GLUCOSE: 105 mg/dL — AB (ref 65–99)
POTASSIUM: 4.8 mmol/L (ref 3.5–5.3)
Sodium: 142 mmol/L (ref 135–146)

## 2016-12-18 ENCOUNTER — Encounter: Payer: Self-pay | Admitting: Cardiology

## 2016-12-21 NOTE — Progress Notes (Signed)
Logan [name] Date of Birth: 29-Dec-1924   History of Present Illness: Logan [name] is seen for followup Afib and CHF. He has a history of permanent atrial fibrillation managed with rate control and anticoagulation. He also has chronic diastolic heart failure with last ejection fraction of 55-60%.   On follow up today he does note that he gets SOB easily when lifting or with exertion. He develops pain in the epigastric area when this happens.  No chest pain or edema.  Still using oxygen. Notes he belches a lot in the morning. He is able to do some shopping and house work.    Current Outpatient Prescriptions on File Prior to Visit  Medication Sig Dispense Refill  . ANORO ELLIPTA 62.5-25 MCG/INH AEPB INHALE 1 PUFF INTO THE LUNGS DAILY. 60 each 8  . ANORO ELLIPTA 62.5-25 MCG/INH AEPB INHALE 1 PUFF INTO THE LUNGS DAILY. 60 each 11  . Calcium-Magnesium-Zinc (CAL-MAG-ZINC PO) Take 1 tablet by mouth every morning.     Marland Kitchen dextromethorphan-guaiFENesin (MUCINEX DM) 30-600 MG per 12 hr tablet Take 1-2 tablets by mouth every 12 (twelve) hours as needed (cough, congestion).    Marland Kitchen digoxin (LANOXIN) 0.125 MG tablet TAKE 1 TABLET (0.125 MG TOTAL) BY MOUTH DAILY. 30 tablet 5  . Fluticasone-Umeclidin-Vilant (TRELEGY ELLIPTA) 100-62.5-25 MCG/INH AEPB Inhale 1 puff into the lungs daily at 6 (six) AM. 60 each 5  . furosemide (LASIX) 40 MG tablet Take 40 mg by mouth daily. May take 1 extra daily as needed for leg swelling    . levothyroxine (SYNTHROID, LEVOTHROID) 75 MCG tablet TAKE 1 TABLET (75 MCG TOTAL) BY MOUTH DAILY. 30 tablet 2  . losartan (COZAAR) 25 MG tablet Take 25 mg by mouth daily.     . Multiple Vitamin (MULTIVITAMIN) tablet Take 1 tablet by mouth daily.     . OXYGEN Use 2L at bedtime and have with activity for shortness of breath    . PROAIR HFA 108 (90 BASE) MCG/ACT inhaler Inhale 2 puffs into the lungs every 4 (four) hours as needed for wheezing or shortness of breath.     . tamsulosin (FLOMAX)  0.4 MG CAPS capsule Take 0.4 mg by mouth daily after breakfast.     . umeclidinium-vilanterol (ANORO ELLIPTA) 62.5-25 MCG/INH AEPB Inhale 1 puff into the lungs daily. For shortness of breath per patient     . warfarin (COUMADIN) 3 MG tablet Take 1 to 1.5 tablets daily as directed by coumadin clinic 135 tablet 1   No current facility-administered medications on file prior to visit.     No Known Allergies  Past Medical History:  Diagnosis Date  . 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 CHF (congestive heart failure) (HCC)    DUE TO SYSTOLIC DYSFUNCTION  . Chronic anticoagulation   . Chronic atrial fibrillation ([place])   . COPD (chronic obstructive pulmonary disease) ([place])   . Dilated cardiomyopathy ([place])   . Hyperlipidemia   . Hypothyroidism   . PVC's (premature ventricular contractions)   . Renal calculi   . Shortness of breath    exertional    Past Surgical History:  Procedure Laterality Date  . APPENDECTOMY    . CARDIAC CATHETERIZATION  02/26/2007   EF 35-40%  . CARDIOVASCULAR STRESS TEST  02/18/2007   EF 40%  . INGUINAL HERNIA REPAIR Right 12/28/2012   Procedure: HERNIA REPAIR INGUINAL ADULT;  Surgeon: Joyice Faster. Cornett, MD;  Location: [facility];  Service: General;  Laterality: Right;  .  LITHOTRIPSY    . MICROLARYNGOSCOPY WITH CO2 LASER AND EXCISION OF VOCAL CORD LESION     15 years ago  . TONSILLECTOMY AND ADENOIDECTOMY    . TRANSTHORACIC ECHOCARDIOGRAM  08/26/2001   EF 25-30%  . US ECHOCARDIOGRAPHY  07/08/2010   EF 50-55%  . US ECHOCARDIOGRAPHY  02/16/2007   EF 25-35%    History  Smoking Status  . Former Smoker  . Packs/day: 1.00  . Years: 35.00  . Types: Cigarettes  . Quit date: 07/11/1961  Smokeless Tobacco  . Never Used    History  Alcohol Use No    Family History  Problem Relation Age of Onset  . Stroke Mother   . Seizures Mother   . Heart attack Father   . Heart failure Father   . Heart attack Brother     Review of Systems: As per history of  present illness.  All other systems were reviewed and are negative.  Physical Exam: BP 120/60   Pulse 99   Ht 5\' 7"  (1.702 m)   Wt 125 lb 6.4 oz (56.9 kg)   BMI 19.64 kg/m  He is a pleasant white male in NAD.  The HEENT exam is normal. The carotids are 2+ without bruits.  There is no thyromegaly.  There is no JVD.  The lungs reveal diminished BS with faint rhonchi.   The heart exam reveals an irregular rate with a normal S1 and S2.  There are no murmurs, gallops, or rubs.  The PMI is not displaced.   Abdominal exam reveals good bowel sounds.  There is no hepatosplenomegaly or tenderness.    Exam of the legs no edema.  Pedal pulses are intact.  Cranial nerves II - XII are intact.  Motor and sensory functions are intact.  The gait is normal.  LABORATORY DATA: Lab Results  Component Value Date   WBC 10.5 12/23/2015   HGB 12.1 (L) 12/23/2015   HCT 36.8 (L) 12/23/2015   PLT 185 12/23/2015   GLUCOSE 105 (H) 12/16/2016   CHOL 157 12/16/2016   TRIG 69 12/16/2016   HDL 52 12/16/2016   LDLCALC 91 12/16/2016   ALT 15 12/16/2016   AST 22 12/16/2016   NA 142 12/16/2016   K 4.8 12/16/2016   CL 100 12/16/2016   CREATININE 0.93 12/16/2016   BUN 16 12/16/2016   CO2 33 (H) 12/16/2016   TSH 0.728 12/22/2015   INR 2.1 12/08/2016   Dig level 0.6  Echo: 12/24/15:Study Conclusions  - Left ventricle: The cavity size was normal. Wall thickness was  increased in a pattern of mild LVH. Systolic function was normal.  The estimated ejection fraction was in the range of 55% to 60%. - Left atrium: The atrium was moderately dilated. - Right ventricle: RV is not well seen to evalaute function. RVEF  may be normal to mildly decreased. - Right atrium: The atrium was moderately dilated. - Pulmonary arteries: PA peak pressure: 53 mm Hg (S).  Ecg today shows AFib with frequent PVCs. Rate 99. Old septal infarct. I have personally reviewed and interpreted this study.   Assessment / Plan: 1. Atrial  fibrillation, permanent. Rate is well controlled today. We will continue on his current dose of metoprolol.  Dig level  was 0.6. Continue anticoagulation with Coumadin. INR therapeutic.  2. History of dilated cardiomyopathy.  Repeat Echo in March 2017 showed normal LV function with moderate Pulmonary HTN.  He will continue with metoprolol and ARB.   3. Chronic diastolic  CHF. stable. Use lasix prn.  4. Hyperlipidemia. Continue simvastatin and fish oil.  5. Chronic respiratory failure  On oxygen.   6. Pulmonary HTN mostly due to #5.  7. Epigastric pain. I suspect this is either acid reflux or muscular pain related to increased use of accessory muscles for breathing. Recommend OTC prilosec to see if this helps.   I will follow up in 6 months

## 2016-12-24 ENCOUNTER — Ambulatory Visit (INDEPENDENT_AMBULATORY_CARE_PROVIDER_SITE_OTHER): Payer: Medicare Other | Admitting: Cardiology

## 2016-12-24 ENCOUNTER — Encounter: Payer: Self-pay | Admitting: Cardiology

## 2016-12-24 VITALS — BP 120/60 | HR 99 | Ht 67.0 in | Wt 125.4 lb

## 2016-12-24 DIAGNOSIS — I482 Chronic atrial fibrillation: Secondary | ICD-10-CM | POA: Diagnosis not present

## 2016-12-24 DIAGNOSIS — I4821 Permanent atrial fibrillation: Secondary | ICD-10-CM

## 2016-12-24 DIAGNOSIS — I5032 Chronic diastolic (congestive) heart failure: Secondary | ICD-10-CM

## 2016-12-24 DIAGNOSIS — Z7901 Long term (current) use of anticoagulants: Secondary | ICD-10-CM | POA: Diagnosis not present

## 2016-12-24 DIAGNOSIS — I493 Ventricular premature depolarization: Secondary | ICD-10-CM

## 2016-12-24 NOTE — Patient Instructions (Signed)
Take OTC Prilosec 10-20 mg daily for acid reflux.  Continue your current medications  I will see you in 6 months.

## 2016-12-27 IMAGING — CR DG CHEST 1V PORT
1 series · 1 of 1 positions shown · non-contrast
Comparison: 11/29/2014

CLINICAL DATA: Acute shortness of Breath

EXAM:
PORTABLE CHEST 1 VIEW

[AP]
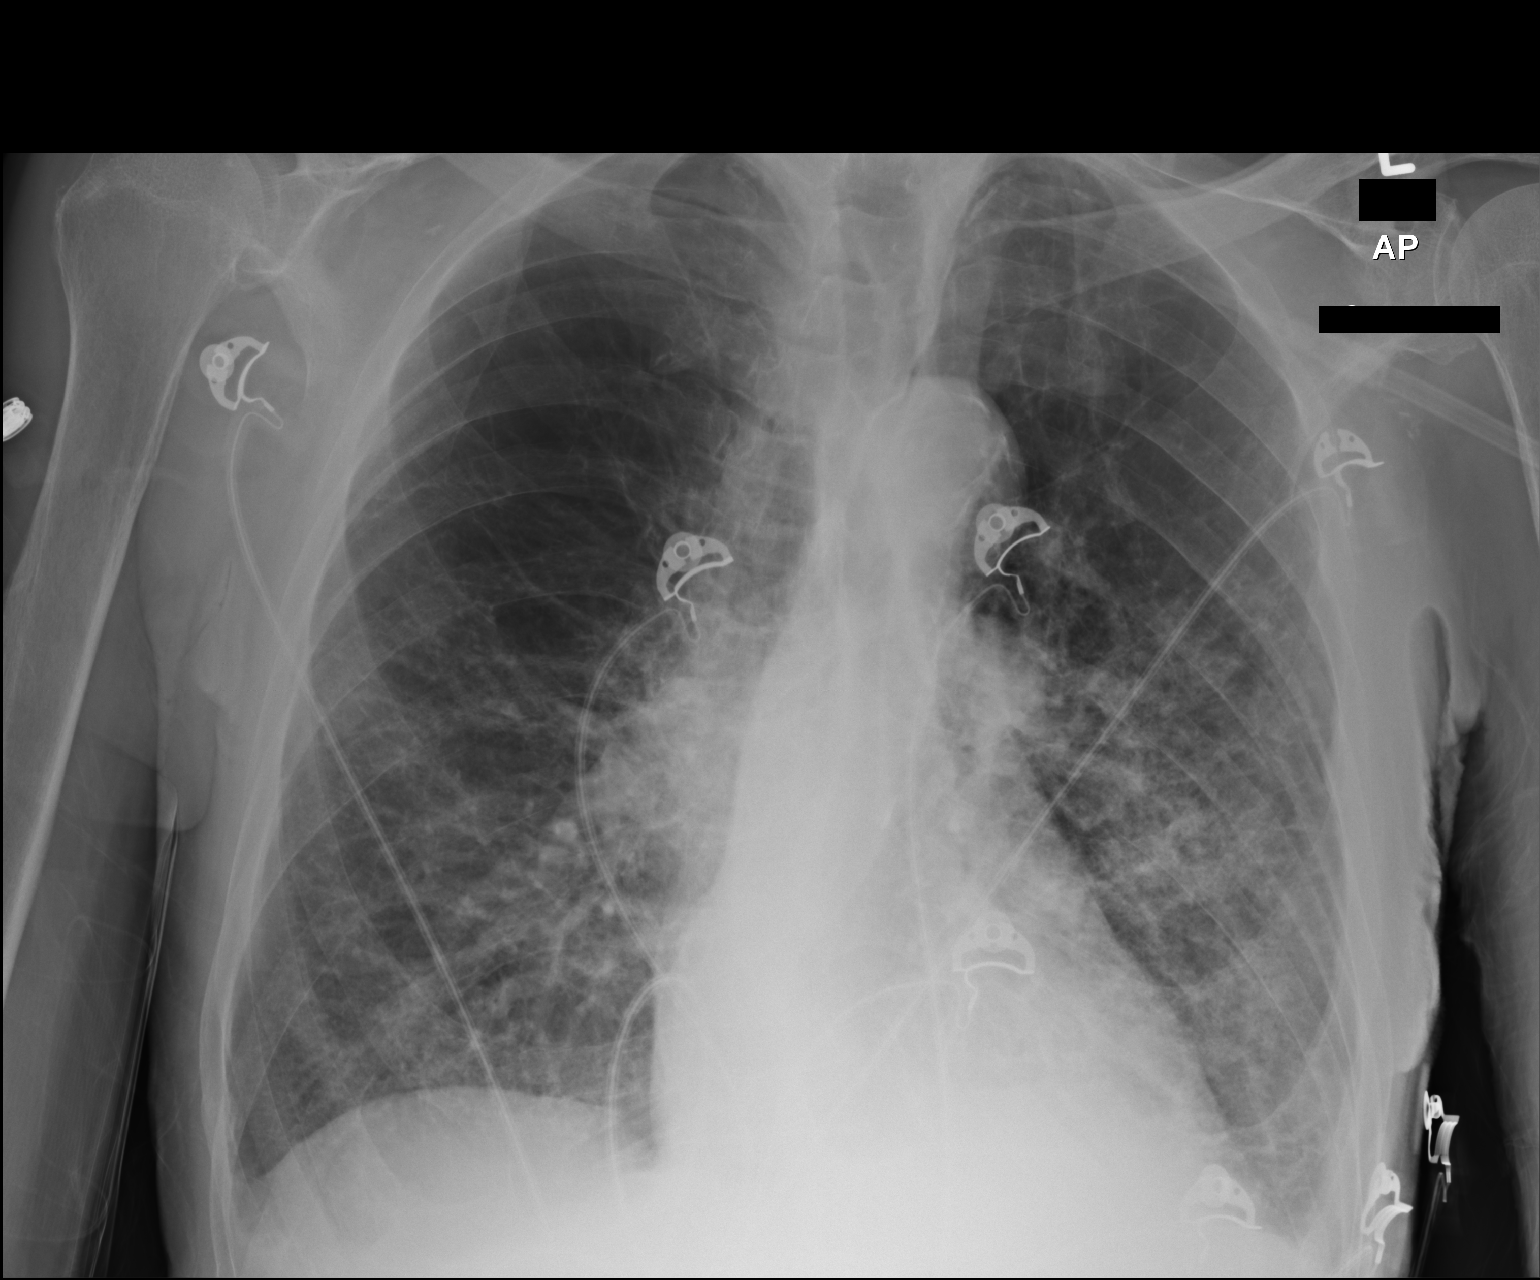

[1 of 1 positions shown; findings below may reference images not displayed]

FINDINGS: Mild bilateral interstitial and alveolar opacities, left greater
than right, improved somewhat since prior study, more so on the
right. Suspect small effusions. Heart is upper limits normal in
size. Underlying COPD/hyperinflation.
IMPRESSION: Improving interstitial and alveolar opacities with mild persistent
asymmetric edema pattern, left greater than right. Suspect small
effusions.

## 2017-01-09 ENCOUNTER — Other Ambulatory Visit: Payer: Self-pay | Admitting: Cardiology

## 2017-01-11 ENCOUNTER — Encounter (HOSPITAL_COMMUNITY): Payer: Self-pay

## 2017-01-11 ENCOUNTER — Inpatient Hospital Stay (HOSPITAL_COMMUNITY)
Admission: EM | Admit: 2017-01-11 | Discharge: 2017-01-18 | DRG: 871 | Disposition: E | Payer: Medicare Other | Attending: Pulmonary Disease | Admitting: Pulmonary Disease

## 2017-01-11 ENCOUNTER — Emergency Department (HOSPITAL_COMMUNITY): Payer: Medicare Other

## 2017-01-11 DIAGNOSIS — J9600 Acute respiratory failure, unspecified whether with hypoxia or hypercapnia: Secondary | ICD-10-CM

## 2017-01-11 DIAGNOSIS — Z87442 Personal history of urinary calculi: Secondary | ICD-10-CM

## 2017-01-11 DIAGNOSIS — N179 Acute kidney failure, unspecified: Secondary | ICD-10-CM | POA: Diagnosis present

## 2017-01-11 DIAGNOSIS — R6521 Severe sepsis with septic shock: Secondary | ICD-10-CM | POA: Diagnosis present

## 2017-01-11 DIAGNOSIS — I420 Dilated cardiomyopathy: Secondary | ICD-10-CM | POA: Diagnosis present

## 2017-01-11 DIAGNOSIS — Z681 Body mass index (BMI) 19 or less, adult: Secondary | ICD-10-CM | POA: Diagnosis not present

## 2017-01-11 DIAGNOSIS — Z4659 Encounter for fitting and adjustment of other gastrointestinal appliance and device: Secondary | ICD-10-CM

## 2017-01-11 DIAGNOSIS — Z823 Family history of stroke: Secondary | ICD-10-CM

## 2017-01-11 DIAGNOSIS — Z66 Do not resuscitate: Secondary | ICD-10-CM | POA: Diagnosis present

## 2017-01-11 DIAGNOSIS — J9621 Acute and chronic respiratory failure with hypoxia: Secondary | ICD-10-CM | POA: Diagnosis present

## 2017-01-11 DIAGNOSIS — R739 Hyperglycemia, unspecified: Secondary | ICD-10-CM | POA: Diagnosis present

## 2017-01-11 DIAGNOSIS — Z7901 Long term (current) use of anticoagulants: Secondary | ICD-10-CM | POA: Diagnosis not present

## 2017-01-11 DIAGNOSIS — E039 Hypothyroidism, unspecified: Secondary | ICD-10-CM | POA: Diagnosis present

## 2017-01-11 DIAGNOSIS — J154 Pneumonia due to other streptococci: Secondary | ICD-10-CM | POA: Diagnosis present

## 2017-01-11 DIAGNOSIS — J441 Chronic obstructive pulmonary disease with (acute) exacerbation: Secondary | ICD-10-CM | POA: Diagnosis present

## 2017-01-11 DIAGNOSIS — I482 Chronic atrial fibrillation: Secondary | ICD-10-CM | POA: Diagnosis present

## 2017-01-11 DIAGNOSIS — Z515 Encounter for palliative care: Secondary | ICD-10-CM | POA: Diagnosis not present

## 2017-01-11 DIAGNOSIS — E872 Acidosis: Secondary | ICD-10-CM | POA: Diagnosis present

## 2017-01-11 DIAGNOSIS — J9602 Acute respiratory failure with hypercapnia: Secondary | ICD-10-CM

## 2017-01-11 DIAGNOSIS — Z9981 Dependence on supplemental oxygen: Secondary | ICD-10-CM | POA: Diagnosis not present

## 2017-01-11 DIAGNOSIS — E785 Hyperlipidemia, unspecified: Secondary | ICD-10-CM | POA: Diagnosis present

## 2017-01-11 DIAGNOSIS — Z8249 Family history of ischemic heart disease and other diseases of the circulatory system: Secondary | ICD-10-CM

## 2017-01-11 DIAGNOSIS — E870 Hyperosmolality and hypernatremia: Secondary | ICD-10-CM | POA: Diagnosis present

## 2017-01-11 DIAGNOSIS — Z7189 Other specified counseling: Secondary | ICD-10-CM

## 2017-01-11 DIAGNOSIS — F419 Anxiety disorder, unspecified: Secondary | ICD-10-CM | POA: Diagnosis present

## 2017-01-11 DIAGNOSIS — I272 Pulmonary hypertension, unspecified: Secondary | ICD-10-CM | POA: Diagnosis present

## 2017-01-11 DIAGNOSIS — J9601 Acute respiratory failure with hypoxia: Secondary | ICD-10-CM

## 2017-01-11 DIAGNOSIS — J189 Pneumonia, unspecified organism: Secondary | ICD-10-CM | POA: Diagnosis not present

## 2017-01-11 DIAGNOSIS — E46 Unspecified protein-calorie malnutrition: Secondary | ICD-10-CM | POA: Diagnosis present

## 2017-01-11 DIAGNOSIS — I5033 Acute on chronic diastolic (congestive) heart failure: Secondary | ICD-10-CM | POA: Diagnosis present

## 2017-01-11 DIAGNOSIS — A419 Sepsis, unspecified organism: Secondary | ICD-10-CM | POA: Diagnosis present

## 2017-01-11 DIAGNOSIS — J9622 Acute and chronic respiratory failure with hypercapnia: Secondary | ICD-10-CM | POA: Diagnosis present

## 2017-01-11 DIAGNOSIS — J440 Chronic obstructive pulmonary disease with acute lower respiratory infection: Secondary | ICD-10-CM | POA: Diagnosis present

## 2017-01-11 DIAGNOSIS — Z87891 Personal history of nicotine dependence: Secondary | ICD-10-CM | POA: Diagnosis not present

## 2017-01-11 DIAGNOSIS — I472 Ventricular tachycardia: Secondary | ICD-10-CM | POA: Diagnosis present

## 2017-01-11 DIAGNOSIS — J13 Pneumonia due to Streptococcus pneumoniae: Secondary | ICD-10-CM | POA: Diagnosis not present

## 2017-01-11 DIAGNOSIS — I4891 Unspecified atrial fibrillation: Secondary | ICD-10-CM | POA: Diagnosis not present

## 2017-01-11 LAB — CREATININE, SERUM
CREATININE: 1.33 mg/dL — AB (ref 0.61–1.24)
GFR calc non Af Amer: 45 mL/min — ABNORMAL LOW (ref >60)
GFR, EST AFRICAN AMERICAN: 53 mL/min — AB (ref >60)

## 2017-01-11 LAB — MRSA PCR SCREENING: MRSA BY PCR: NEGATIVE

## 2017-01-11 LAB — CBC WITH DIFFERENTIAL/PLATELET
BASOS ABS: 0 10*3/uL (ref 0.0–0.1)
Basophils Relative: 0 %
EOS PCT: 0 %
Eosinophils Absolute: 0 10*3/uL (ref 0.0–0.7)
HEMATOCRIT: 47.3 % (ref 39.0–52.0)
HEMOGLOBIN: 15 g/dL (ref 13.0–17.0)
LYMPHS ABS: 1.6 10*3/uL (ref 0.7–4.0)
LYMPHS PCT: 9 %
MCH: 33.5 pg (ref 26.0–34.0)
MCHC: 31.7 g/dL (ref 30.0–36.0)
MCV: 105.6 fL — AB (ref 78.0–100.0)
MONOS PCT: 5 %
Monocytes Absolute: 0.9 10*3/uL (ref 0.1–1.0)
Neutro Abs: 15.1 10*3/uL — ABNORMAL HIGH (ref 1.7–7.7)
Neutrophils Relative %: 86 %
Platelets: 231 10*3/uL (ref 150–400)
RBC: 4.48 MIL/uL (ref 4.22–5.81)
RDW: 15.6 % — ABNORMAL HIGH (ref 11.5–15.5)
WBC Morphology: INCREASED
WBC: 17.6 10*3/uL — AB (ref 4.0–10.5)

## 2017-01-11 LAB — I-STAT ARTERIAL BLOOD GAS, ED
Acid-Base Excess: 3 mmol/L — ABNORMAL HIGH (ref 0.0–2.0)
Bicarbonate: 34.1 mmol/L — ABNORMAL HIGH (ref 20.0–28.0)
O2 SAT: 96 %
PCO2 ART: 87.8 mmHg — AB (ref 32.0–48.0)
PH ART: 7.196 — AB (ref 7.350–7.450)
Patient temperature: 98.5
TCO2: 37 mmol/L (ref 0–100)
pO2, Arterial: 109 mmHg — ABNORMAL HIGH (ref 83.0–108.0)

## 2017-01-11 LAB — COMPREHENSIVE METABOLIC PANEL
ALBUMIN: 3.4 g/dL — AB (ref 3.5–5.0)
ALK PHOS: 65 U/L (ref 38–126)
ALT: 30 U/L (ref 17–63)
AST: 39 U/L (ref 15–41)
Anion gap: 11 (ref 5–15)
BILIRUBIN TOTAL: 0.9 mg/dL (ref 0.3–1.2)
BUN: 24 mg/dL — AB (ref 6–20)
CALCIUM: 9.5 mg/dL (ref 8.9–10.3)
CO2: 32 mmol/L (ref 22–32)
CREATININE: 1.44 mg/dL — AB (ref 0.61–1.24)
Chloride: 99 mmol/L — ABNORMAL LOW (ref 101–111)
GFR calc Af Amer: 48 mL/min — ABNORMAL LOW (ref >60)
GFR, EST NON AFRICAN AMERICAN: 41 mL/min — AB (ref >60)
GLUCOSE: 212 mg/dL — AB (ref 65–99)
Potassium: 4.9 mmol/L (ref 3.5–5.1)
Sodium: 142 mmol/L (ref 135–145)
Total Protein: 7.1 g/dL (ref 6.5–8.1)

## 2017-01-11 LAB — URINALYSIS, ROUTINE W REFLEX MICROSCOPIC
Bilirubin Urine: NEGATIVE
GLUCOSE, UA: NEGATIVE mg/dL
HGB URINE DIPSTICK: NEGATIVE
KETONES UR: NEGATIVE mg/dL
Leukocytes, UA: NEGATIVE
Nitrite: NEGATIVE
PROTEIN: NEGATIVE mg/dL
Specific Gravity, Urine: 1.015 (ref 1.005–1.030)
pH: 5 (ref 5.0–8.0)

## 2017-01-11 LAB — I-STAT CG4 LACTIC ACID, ED: Lactic Acid, Venous: 5.05 mmol/L (ref 0.5–1.9)

## 2017-01-11 LAB — I-STAT CHEM 8, ED
BUN: 31 mg/dL — AB (ref 6–20)
CALCIUM ION: 1.2 mmol/L (ref 1.15–1.40)
CHLORIDE: 100 mmol/L — AB (ref 101–111)
CREATININE: 1.3 mg/dL — AB (ref 0.61–1.24)
Glucose, Bld: 206 mg/dL — ABNORMAL HIGH (ref 65–99)
HCT: 50 % (ref 39.0–52.0)
Hemoglobin: 17 g/dL (ref 13.0–17.0)
Potassium: 4.8 mmol/L (ref 3.5–5.1)
Sodium: 142 mmol/L (ref 135–145)
TCO2: 34 mmol/L (ref 0–100)

## 2017-01-11 LAB — MAGNESIUM: Magnesium: 2.3 mg/dL (ref 1.7–2.4)

## 2017-01-11 LAB — LIPASE, BLOOD

## 2017-01-11 LAB — DIGOXIN LEVEL: Digoxin Level: 1.3 ng/mL (ref 0.8–2.0)

## 2017-01-11 LAB — BRAIN NATRIURETIC PEPTIDE: B NATRIURETIC PEPTIDE 5: 616.6 pg/mL — AB (ref 0.0–100.0)

## 2017-01-11 LAB — LACTIC ACID, PLASMA: Lactic Acid, Venous: 5 mmol/L (ref 0.5–1.9)

## 2017-01-11 LAB — PROTIME-INR
INR: 2.38
PROTHROMBIN TIME: 26.4 s — AB (ref 11.4–15.2)

## 2017-01-11 LAB — GLUCOSE, CAPILLARY: Glucose-Capillary: 208 mg/dL — ABNORMAL HIGH (ref 65–99)

## 2017-01-11 LAB — PHOSPHORUS: Phosphorus: 5.7 mg/dL — ABNORMAL HIGH (ref 2.5–4.6)

## 2017-01-11 LAB — I-STAT TROPONIN, ED: Troponin i, poc: 0.07 ng/mL (ref 0.00–0.08)

## 2017-01-11 LAB — CK: Total CK: 34 U/L — ABNORMAL LOW (ref 49–397)

## 2017-01-11 MED ORDER — DEXTROSE 5 % IV SOLN: 1.0000 g | Freq: Every day | INTRAVENOUS | Status: DC

## 2017-01-11 MED ORDER — HEPARIN SODIUM (PORCINE) 5000 UNIT/ML IJ SOLN: 5000.0000 [IU] | Freq: Three times a day (TID) | INTRAMUSCULAR | Status: DC

## 2017-01-11 MED ORDER — BUDESONIDE 0.25 MG/2ML IN SUSP
0.2500 mg | Freq: Two times a day (BID) | RESPIRATORY_TRACT | Status: DC
  Administered 2017-01-11 – 2017-01-17 (×12): 0.25 mg via RESPIRATORY_TRACT
  Filled 2017-01-11 (×12): qty 2

## 2017-01-11 MED ORDER — SENNOSIDES 8.8 MG/5ML PO SYRP
5.0000 mL | ORAL_SOLUTION | Freq: Two times a day (BID) | ORAL | Status: DC | PRN
  Filled 2017-01-11: qty 5

## 2017-01-11 MED ORDER — FENTANYL CITRATE (PF) 100 MCG/2ML IJ SOLN: 12.5000 ug | INTRAMUSCULAR | Status: DC | PRN

## 2017-01-11 MED ORDER — FENTANYL CITRATE (PF) 100 MCG/2ML IJ SOLN: 50.0000 ug | INTRAMUSCULAR | Status: DC | PRN

## 2017-01-11 MED ORDER — SODIUM CHLORIDE 0.9 % IV BOLUS (SEPSIS)
1000.0000 mL | Freq: Once | INTRAVENOUS | Status: AC
  Administered 2017-01-11: 1000 mL via INTRAVENOUS

## 2017-01-11 MED ORDER — FENTANYL CITRATE (PF) 100 MCG/2ML IJ SOLN
12.5000 ug | INTRAMUSCULAR | Status: DC | PRN
  Filled 2017-01-11 (×3): qty 2

## 2017-01-11 MED ORDER — METHYLPREDNISOLONE SODIUM SUCC 125 MG IJ SOLR
60.0000 mg | Freq: Every day | INTRAMUSCULAR | Status: DC
  Administered 2017-01-12 – 2017-01-14 (×4): 60 mg via INTRAVENOUS
  Filled 2017-01-11 (×4): qty 0.96

## 2017-01-11 MED ORDER — MIDAZOLAM HCL 2 MG/2ML IJ SOLN: 1.0000 mg | INTRAMUSCULAR | Status: DC | PRN

## 2017-01-11 MED ORDER — LEVOTHYROXINE SODIUM 75 MCG PO TABS
75.0000 ug | ORAL_TABLET | Freq: Every day | ORAL | Status: DC
  Administered 2017-01-12 – 2017-01-15 (×4): 75 ug
  Filled 2017-01-11 (×5): qty 1

## 2017-01-11 MED ORDER — IPRATROPIUM-ALBUTEROL 0.5-2.5 (3) MG/3ML IN SOLN
3.0000 mL | Freq: Four times a day (QID) | RESPIRATORY_TRACT | Status: DC
  Administered 2017-01-11 – 2017-01-17 (×23): 3 mL via RESPIRATORY_TRACT
  Filled 2017-01-11 (×22): qty 3

## 2017-01-11 MED ORDER — CHLORHEXIDINE GLUCONATE 0.12% ORAL RINSE (MEDLINE KIT)
15.0000 mL | Freq: Two times a day (BID) | OROMUCOSAL | Status: DC
  Administered 2017-01-11 – 2017-01-15 (×9): 15 mL via OROMUCOSAL

## 2017-01-11 MED ORDER — FENTANYL CITRATE (PF) 100 MCG/2ML IJ SOLN
50.0000 ug | INTRAMUSCULAR | Status: DC | PRN
  Administered 2017-01-11: 50 ug via INTRAVENOUS
  Filled 2017-01-11: qty 2

## 2017-01-11 MED ORDER — ORAL CARE MOUTH RINSE
15.0000 mL | Freq: Four times a day (QID) | OROMUCOSAL | Status: DC
  Administered 2017-01-12 – 2017-01-16 (×17): 15 mL via OROMUCOSAL

## 2017-01-11 MED ORDER — FAMOTIDINE 40 MG/5ML PO SUSR
20.0000 mg | Freq: Two times a day (BID) | ORAL | Status: DC
  Administered 2017-01-11 – 2017-01-12 (×2): 20 mg
  Filled 2017-01-11 (×2): qty 2.5

## 2017-01-11 MED ORDER — ROCURONIUM BROMIDE 50 MG/5ML IV SOLN
INTRAVENOUS | Status: AC | PRN
  Administered 2017-01-11: 60 mg via INTRAVENOUS

## 2017-01-11 MED ORDER — DEXTROSE 5 % IV SOLN
1.0000 g | Freq: Once | INTRAVENOUS | Status: AC
  Administered 2017-01-11: 1 g via INTRAVENOUS
  Filled 2017-01-11 (×2): qty 10

## 2017-01-11 MED ORDER — BISACODYL 10 MG RE SUPP: 10.0000 mg | Freq: Every day | RECTAL | Status: DC | PRN

## 2017-01-11 MED ORDER — NOREPINEPHRINE BITARTRATE 1 MG/ML IV SOLN
0.0000 ug/min | Freq: Once | INTRAVENOUS | Status: AC
  Administered 2017-01-11: 5 ug/min via INTRAVENOUS
  Filled 2017-01-11: qty 4

## 2017-01-11 MED ORDER — SODIUM CHLORIDE 0.9 % IV SOLN
250.0000 mL | INTRAVENOUS | Status: DC | PRN
  Administered 2017-01-11: 250 mL via INTRAVENOUS

## 2017-01-11 MED ORDER — ETOMIDATE 2 MG/ML IV SOLN
INTRAVENOUS | Status: AC | PRN
  Administered 2017-01-11: 20 mg via INTRAVENOUS

## 2017-01-11 MED ORDER — FENTANYL CITRATE (PF) 100 MCG/2ML IJ SOLN
12.5000 ug | INTRAMUSCULAR | Status: DC | PRN
  Administered 2017-01-11: 12.5 ug via INTRAVENOUS

## 2017-01-11 MED ORDER — DEXTROSE 5 % IV SOLN
500.0000 mg | Freq: Once | INTRAVENOUS | Status: AC
  Administered 2017-01-11: 500 mg via INTRAVENOUS
  Filled 2017-01-11: qty 500

## 2017-01-11 MED ORDER — AZITHROMYCIN 200 MG/5ML PO SUSR: 500.0000 mg | ORAL | Status: DC

## 2017-01-11 NOTE — Progress Notes (Signed)
Pt. Transported to [facility]05 uneventfully, RT made aware.

## 2017-01-11 NOTE — ED Provider Notes (Addendum)
Seen on arrival. Level V caveat severe respiratory distress. History is obtained from paramedics and from patient's wife by telephone. Patient complained of shortness of breath progressively worsening onset last night. Para medics found him unresponsive. Treated him with CPAP and supplemental oxygen. His wife states that he has not been coughing. No fever. On exam chronically and acutely ill-appearing. Minimally responsive to noxious stimulus. Respirations shallow. Rhonchi diffusely. Patient felt to be in acute respiratory failure. He was intubated by Dr.Caudill after premedication with etomidate and rocker rocuronium. I was present during the entire procedure and supervised the procedure . I had lengthy discussion with patient's wife who wishes him to be a full code at present. She is in agreement pallor of care consult which I have called. Chest xray viewed by me CRITICAL CARE  Performed by: Orlie Dakin Total critical care time:35  minutes Critical care time was exclusive of separately billable procedures and treating other patients. Critical care was necessary to treat or prevent imminent or life-threatening deterioration. Critical care was time spent personally by me on the following activities: development of treatment plan with patient and/or surrogate as well as nursing, discussions with consultants, evaluation of patient's response to treatment, examination of patient, obtaining history from patient or surrogate, ordering and performing treatments and interventions, ordering and review of laboratory studies, ordering and review of radiographic studies, pulse oximetry and re-evaluation of patient's condition.   Orlie Dakin, MD 12/31/2016 2004    Orlie Dakin, MD 01/13/17 1055

## 2017-01-11 NOTE — ED Notes (Signed)
Critical care bedside, ordered no more sedation medication at this time, 1L.

## 2017-01-11 NOTE — Progress Notes (Signed)
[facility] Progress Note Patient Name: Logan [name] DOB: 1925/01/07 MRN: [id number]   Date of Service  01/14/2017  HPI/Events of Note  Nursing requesting foley with large residual  eICU Interventions  Place foley     Intervention Category Minor Interventions: Routine modifications to care plan (e.g. PRN medications for pain, fever)  Christinia Gully 12/30/2016, 10:52 PM

## 2017-01-11 NOTE — ED Notes (Signed)
Watch, wallet and clothes given to pts wife.

## 2017-01-11 NOTE — ED Provider Notes (Signed)
[name]   CSN: [id number] Arrival date & time: 01/16/2017  [name]     History   Chief Complaint Chief Complaint  Patient presents with  . Respiratory Distress    HPI Logan [name] is a 83 y.o. male PMH COPD (2L [place] baseline), Afib (warfarin), CHF presents via EMS for AMS and respiratory distress. Wife reports patient started to complain of shortness of breath yesterday evening. This morning home pulse ox read 82% and they increased his O2 from 2L to 5L [place]. Wife states he slept most of the day and did not finish much of his dinner. Just prior to arrival he reported he was not feeling well and wanted to go to the hospital. Wife states they were getting ready to drive to the hospital when his breathing became worse and drastic mental decline. EMS noted sats in the 80s. Placed on BiPAP and brought to ED. Episode of projectile vomiting in ambulance.   The history is provided by the EMS personnel, medical records and the spouse.  Shortness of Breath  This is a new problem. The average episode lasts 1 day. The current episode started yesterday. The problem has been rapidly worsening. Associated symptoms include cough and vomiting. Pertinent negatives include no fever, no rhinorrhea, no sore throat, no sputum production, no hemoptysis, no wheezing, no chest pain, no syncope, no abdominal pain, no leg pain and no leg swelling. Treatments tried: oxygen. The treatment provided no relief. He has had prior hospitalizations. Associated medical issues include COPD and heart failure. Associated medical issues do not include asthma, PE, CAD, past MI, DVT or recent surgery.    Past Medical History:  Diagnosis Date  . Cancer ([place])    vocal cord - radiation   . CHF (congestive heart failure) (H[facility])    DUE TO SYSTOLIC DYSFUNCTION  . Chronic anticoagulation   . Chronic atrial fibrillation ([place])   . COPD (chronic obstructive pulmonary disease) ([place])   . Dilated cardiomyopathy ([place])     . Hyperlipidemia   . Hypothyroidism   . PVC's (premature ventricular contractions)   . Renal calculi   . Shortness of breath    exertional    Patient Active Problem List   Diagnosis Date Noted  . COPD with acute exacerbation ([place]) 12/29/2016  . Chronic obstructive pulmonary disease ([place])   . CAP (community acquired pneumonia)   . Community acquired pneumonia   . Asymptomatic PVCs 12/22/2015  . COPD (chronic obstructive pulmonary disease) ([place]) 12/21/2015  . Acute respiratory failure with hypoxia ([place]) 12/21/2015  . Pulmonary hypertension   . Hypothyroidism 11/25/2014  . Dyspnea 12/21/2013  . Bilateral pleural effusion 12/21/2013  . COPD IV due to assoc hypercarbic resp failure 11/25/2013  . Chronic respiratory failure with hypoxia and hypercapnia ([place]) 11/25/2013  . Encounter for therapeutic drug monitoring 11/18/2013  . Pneumonia 10/30/2013  . Solitary pulmonary nodule 01/14/2013  . Post-operative state 01/14/2013  . Chronic diastolic CHF (congestive heart failure) ([place]) 08/13/2012  . Acute on chronic diastolic congestive heart failure ([place]) 07/25/2012  . Chronic atrial fibrillation ([place])   . Dilated cardiomyopathy ([place])   . Hyperlipidemia   . Chronic anticoagulation   . Atrial fibrillation ([place]) 01/27/2011    Past Surgical History:  Procedure Laterality Date  . APPENDECTOMY    . CARDIAC CATHETERIZATION  02/26/2007   EF 35-40%  . CARDIOVASCULAR STRESS TEST  02/18/2007   EF 40%  . INGUINAL HERNIA REPAIR Right 12/28/2012   Procedure: HERNIA REPAIR INGUINAL ADULT;  Surgeon: Joyice Faster. Cornett, MD;  Location: [facility];  Service: General;  Laterality: Right;  . LITHOTRIPSY    . MICROLARYNGOSCOPY WITH CO2 LASER AND EXCISION OF VOCAL CORD LESION     15 years ago  . TONSILLECTOMY AND ADENOIDECTOMY    . TRANSTHORACIC ECHOCARDIOGRAM  08/26/2001   EF 25-30%  . US ECHOCARDIOGRAPHY  07/08/2010   EF 50-55%  . US ECHOCARDIOGRAPHY  02/16/2007   EF 25-35%       Home Medications     Prior to Admission medications   Medication Sig Start Date End Date Taking? Authorizing Provider  ANORO ELLIPTA 62.5-25 MCG/INH AEPB INHALE 1 PUFF INTO THE LUNGS DAILY. 09/26/16   Tanda Rockers, MD  ANORO ELLIPTA 62.5-25 MCG/INH AEPB INHALE 1 PUFF INTO THE LUNGS DAILY. 10/23/16   Tanda Rockers, MD  Calcium-Magnesium-Zinc (CAL-MAG-ZINC PO) Take 1 tablet by mouth every morning.     Historical Provider, MD  dextromethorphan-guaiFENesin (MUCINEX DM) 30-600 MG per 12 hr tablet Take 1-2 tablets by mouth every 12 (twelve) hours as needed (cough, congestion).    Historical Provider, MD  digoxin (LANOXIN) 0.125 MG tablet TAKE 1 TABLET (0.125 MG TOTAL) BY MOUTH DAILY. 09/03/16   Peter M Martinique, MD  Fluticasone-Umeclidin-Vilant (TRELEGY ELLIPTA) 100-62.5-25 MCG/INH AEPB Inhale 1 puff into the lungs daily at 6 (six) AM. 09/24/16   Tanda Rockers, MD  furosemide (LASIX) 40 MG tablet Take 40 mg by mouth daily. May take 1 extra daily as needed for leg swelling 03/27/14   Peter M Martinique, MD  levothyroxine (SYNTHROID, LEVOTHROID) 75 MCG tablet TAKE 1 TABLET (75 MCG TOTAL) BY MOUTH DAILY. 10/22/16   Peter M Martinique, MD  losartan (COZAAR) 25 MG tablet Take 25 mg by mouth daily.  12/14/13   Historical Provider, MD  Multiple Vitamin (MULTIVITAMIN) tablet Take 1 tablet by mouth daily.     Historical Provider, MD  OXYGEN Use 2L at bedtime and have with activity for shortness of breath    Historical Provider, MD  PROAIR HFA 108 (90 BASE) MCG/ACT inhaler Inhale 2 puffs into the lungs every 4 (four) hours as needed for wheezing or shortness of breath.  03/02/14   Historical Provider, MD  tamsulosin (FLOMAX) 0.4 MG CAPS capsule Take 0.4 mg by mouth daily after breakfast.     Historical Provider, MD  umeclidinium-vilanterol (ANORO ELLIPTA) 62.5-25 MCG/INH AEPB Inhale 1 puff into the lungs daily. For shortness of breath per patient     Historical Provider, MD  warfarin (COUMADIN) 3 MG tablet TAKE 1 TO 1.5 TABLETS DAILY AS DIRECTED BY  [facility] 01/09/17   Peter M Martinique, MD    Family History Family History  Problem Relation Age of Onset  . Stroke Mother   . Seizures Mother   . Heart attack Father   . Heart failure Father   . Heart attack Brother     Social History Social History  Substance Use Topics  . Smoking status: Former Smoker    Packs/day: 1.00    Years: 35.00    Types: Cigarettes    Quit date: 07/11/1961  . Smokeless tobacco: Never Used  . Alcohol use No     Allergies   Patient has no known allergies.   Review of Systems Review of Systems  Unable to perform ROS: Patient unresponsive  Constitutional: Negative for fever.  HENT: Negative for rhinorrhea and sore throat.   Respiratory: Positive for cough and shortness of breath. Negative for hemoptysis, sputum production and wheezing.  Cardiovascular: Negative for chest pain, leg swelling and syncope.  Gastrointestinal: Positive for vomiting. Negative for abdominal pain.     Physical Exam Updated Vital Signs BP 133/73   Pulse (!) 139   Temp 98.2 F (36.8 C) (Oral)   Resp (!) 26   Ht _0  (1.753 m)   Wt 56.7 kg   SpO2 90%   BMI 18.46 kg/m   Physical Exam  Constitutional: He appears cachectic.  HENT:  Head: Normocephalic and atraumatic.  Eyes: Conjunctivae are normal. Pupils are equal, round, and reactive to light.  Neck: Normal range of motion. Neck supple.  Cardiovascular: Intact distal pulses.  Tachycardia present.   No murmur heard. Pulmonary/Chest: He is in respiratory distress. He has no wheezes. He has rales (diffuse).  Abdominal: Soft. He exhibits no distension.  Musculoskeletal: He exhibits no edema.  Neurological: He is unresponsive. GCS eye subscore is 1. GCS verbal subscore is 1. GCS motor subscore is 1.  Not moving extremities  Skin: Skin is warm. Capillary refill takes less than 2 seconds. He is diaphoretic.  Nursing note and vitals reviewed.    ED Treatments / Results  Labs (all labs ordered are  listed, but only abnormal results are displayed) Labs Reviewed  CBC WITH DIFFERENTIAL/PLATELET - Abnormal; Notable for the following:       Result Value   WBC 17.6 (*)    MCV 105.6 (*)    RDW 15.6 (*)    Neutro Abs 15.1 (*)    All other components within normal limits  COMPREHENSIVE METABOLIC PANEL - Abnormal; Notable for the following:    Chloride 99 (*)    Glucose, Bld 212 (*)    BUN 24 (*)    Creatinine, Ser 1.44 (*)    Albumin 3.4 (*)    GFR calc non Af Amer 41 (*)    GFR calc Af Amer 48 (*)    All other components within normal limits  URINALYSIS, ROUTINE W REFLEX MICROSCOPIC - Abnormal; Notable for the following:    APPearance HAZY (*)    All other components within normal limits  BRAIN NATRIURETIC PEPTIDE - Abnormal; Notable for the following:    B Natriuretic Peptide 616.6 (*)    All other components within normal limits  PROTIME-INR - Abnormal; Notable for the following:    Prothrombin Time 26.4 (*)    All other components within normal limits  CREATININE, SERUM - Abnormal; Notable for the following:    Creatinine, Ser 1.33 (*)    GFR calc non Af Amer 45 (*)    GFR calc Af Amer 53 (*)    All other components within normal limits  LIPASE, BLOOD - Abnormal; Notable for the following:    Lipase <10 (*)    All other components within normal limits  PHOSPHORUS - Abnormal; Notable for the following:    Phosphorus 5.7 (*)    All other components within normal limits  LACTIC ACID, PLASMA - Abnormal; Notable for the following:    Lactic Acid, Venous 5.0 (*)    All other components within normal limits  STREP PNEUMONIAE URINARY ANTIGEN - Abnormal; Notable for the following:    Strep Pneumo Urinary Antigen POSITIVE (*)    All other components within normal limits  CK - Abnormal; Notable for the following:    Total CK 34 (*)    All other components within normal limits  GLUCOSE, CAPILLARY - Abnormal; Notable for the following:    Glucose-Capillary 208 (*)  All other  components within normal limits  I-STAT ARTERIAL BLOOD GAS, ED - Abnormal; Notable for the following:    pH, Arterial 7.196 (*)    pCO2 arterial 87.8 (*)    pO2, Arterial 109.0 (*)    Bicarbonate 34.1 (*)    Acid-Base Excess 3.0 (*)    All other components within normal limits  I-STAT CHEM 8, ED - Abnormal; Notable for the following:    Chloride 100 (*)    BUN 31 (*)    Creatinine, Ser 1.30 (*)    Glucose, Bld 206 (*)    All other components within normal limits  I-STAT CG4 LACTIC ACID, ED - Abnormal; Notable for the following:    Lactic Acid, Venous 5.05 (*)    All other components within normal limits  POCT I-STAT 3, ART BLOOD GAS (G3+) - Abnormal; Notable for the following:    pH, Arterial 7.186 (*)    pCO2 arterial 78.1 (*)    pO2, Arterial 77.0 (*)    Bicarbonate 29.5 (*)    All other components within normal limits  POCT I-STAT 3, ART BLOOD GAS (G3+) - Abnormal; Notable for the following:    pH, Arterial 7.210 (*)    pCO2 arterial 81.3 (*)    pO2, Arterial 80.0 (*)    Bicarbonate 32.5 (*)    All other components within normal limits  MRSA PCR SCREENING  CULTURE, BLOOD (ROUTINE X 2)  CULTURE, BLOOD (ROUTINE X 2)  CULTURE, RESPIRATORY (NON-EXPECTORATED)  RESPIRATORY PANEL BY PCR  RESPIRATORY PANEL BY PCR  MAGNESIUM  DIGOXIN LEVEL  INFLUENZA PANEL BY PCR (TYPE A & B)  INFLUENZA PANEL BY PCR (TYPE A & B)  LACTIC ACID, PLASMA  CBC  BASIC METABOLIC PANEL  LEGIONELLA PNEUMOPHILA SEROGP 1 UR AG  BLOOD GAS, ARTERIAL  BLOOD GAS, ARTERIAL  I-STAT TROPOININ, ED  I-STAT CG4 LACTIC ACID, ED    EKG  EKG Interpretation  Date/Time:  Sunday January 11 2017 19:15:13 EDT Ventricular Rate:  117 PR Interval:    QRS Duration: 91 QT Interval:  348 QTC Calculation: 442 R Axis:   101 Text Interpretation:  Atrial fibrillation Ventricular tachycardia, unsustained Lateral infarct, old Anteroseptal infarct, age indeterminate No significant change since last tracing Confirmed by  Winfred Leeds  MD, SAM [phone]) on 01/03/2017 7:23:37 PM       Radiology Dg Chest Portable 1 View  Result Date: 01/05/2017 CLINICAL DATA:  83 year old male who was reported in respiratory distress by his wife, and EMS found him minimally responsive. Intubated in the emergency department. EXAM: PORTABLE CHEST 1 VIEW COMPARISON:  Chest radiographs 02/11/2016 FINDINGS: Portable AP supine view at 1854 hours. Endotracheal tube tip in good position between the level the clavicles and carina. An enteric tube has been placed and courses to the abdomen, tip not included. Partially visible gaseous distension of the stomach. There is a large area of confluent abnormal pulmonary opacity in the left mid and lower lung. No superimposed pneumothorax. There is evidence of superimposed emphysema. No pulmonary edema. The right lung appears stable since 2017 when allowing for portable technique. Stable cardiac size and mediastinal contours. Calcified aortic atherosclerosis. IMPRESSION: 1. Endotracheal tube tip in good position. Enteric tube courses to the abdomen, tip not included. 2. Large confluent area of left mid and lower lung opacity. Although nonspecific but top differential considerations in this clinical setting are pneumonia and large aspiration. 3. Underlying pulmonary hyperinflation with emphysema. 4. Calcified aortic atherosclerosis. Electronically Signed   By: Lemmie Evens  [place] Crane M.D.   On: 01/14/2017 19:29    Procedures Procedure Name: Intubation Date/Time: 12/30/2016 6:52 PM Performed by: Monico Blitz Pre-anesthesia Checklist: Patient identified Oxygen Delivery Method: Ambu bag Preoxygenation: Pre-oxygenation with 100% oxygen Intubation Type: Rapid sequence Ventilation: Mask ventilation without difficulty Laryngoscope Size: Mac and 4 Grade View: Grade II Tube size: 7.5 mm Number of attempts: 1 Airway Equipment and Method: Stylet Placement Confirmation: ETT inserted through vocal cords under direct  vision,  Positive ETCO2 and Breath sounds checked- equal and bilateral Secured at: 23 (lip) cm Tube secured with: ETT holder      (including critical care time)  Medications Ordered in ED Medications  levothyroxine (SYNTHROID, LEVOTHROID) tablet 75 mcg (not administered)  0.9 %  sodium chloride infusion (250 mLs Intravenous New Bag/Given 01/01/2017 2324)  famotidine (PEPCID) 40 MG/5ML suspension 20 mg (20 mg Per Tube Given 12/25/2016 2247)  methylPREDNISolone sodium succinate (SOLU-MEDROL) 125 mg/2 mL injection 60 mg (60 mg Intravenous Given 01/12/17 0049)  cefTRIAXone (ROCEPHIN) 1 g in dextrose 5 % 50 mL IVPB (not administered)  fentaNYL (SUBLIMAZE) injection 12.5 mcg (not administered)  fentaNYL (SUBLIMAZE) injection 12.5 mcg (12.5 mcg Intravenous Given 01/03/2017 2324)  sennosides (SENOKOT) 8.8 MG/5ML syrup 5 mL (not administered)  bisacodyl (DULCOLAX) suppository 10 mg (not administered)  ipratropium-albuterol (DUONEB) 0.5-2.5 (3) MG/3ML nebulizer solution 3 mL (3 mLs Nebulization Given 01/12/2017 2348)  budesonide (PULMICORT) nebulizer solution 0.25 mg (0.25 mg Nebulization Given 12/27/2016 2348)  chlorhexidine gluconate (MEDLINE KIT) (PERIDEX) 0.12 % solution 15 mL (15 mLs Mouth Rinse Given 01/10/2017 2315)  MEDLINE mouth rinse (15 mLs Mouth Rinse Given 01/12/17 0019)  phenylephrine (NEO-SYNEPHRINE) 10 mg in sodium chloride 0.9 % 250 mL (0.04 mg/mL) infusion (50 mcg/min Intravenous New Bag/Given 01/12/17 0040)  fentaNYL 2586mg in NS 2538m(1021mml) infusion-PREMIX (75 mcg/hr Intravenous New Bag/Given 01/12/17 0204)  fentaNYL (SUBLIMAZE) bolus via infusion 50 mcg (not administered)  midazolam (VERSED) injection 2 mg (not administered)  midazolam (VERSED) injection 2 mg (not administered)  docusate (COLACE) 50 MG/5ML liquid 100 mg (not administered)  azithromycin (ZITHROMAX) 200 MG/5ML suspension 250 mg (not administered)  etomidate (AMIDATE) injection (20 mg Intravenous Given 01/05/2017 1846)    rocuronium (ZEMURON) injection (60 mg Intravenous Given 01/15/2017 1847)  sodium chloride 0.9 % bolus 1,000 mL (0 mLs Intravenous Stopped 01/04/2017 1941)  norepinephrine (LEVOPHED) 4 mg in dextrose 5 % 250 mL (0.016 mg/mL) infusion (0 mcg/min Intravenous Stopped 01/15/2017 2200)  cefTRIAXone (ROCEPHIN) 1 g in dextrose 5 % 50 mL IVPB (1 g Intravenous Given 12/31/2016 2324)  azithromycin (ZITHROMAX) 500 mg in dextrose 5 % 250 mL IVPB (0 mg Intravenous Stopped 01/12/2017 2153)  sodium chloride 0.9 % bolus 1,000 mL (1,000 mLs Intravenous New Bag/Given 01/10/2017 2103)  sodium chloride 0.9 % bolus 500 mL (500 mLs Intravenous Given 01/12/17 0019)  sodium bicarbonate injection 100 mEq (100 mEq Intravenous Given 01/12/17 0015)  sodium bicarbonate 1 mEq/mL injection (1 mEq/L  Given 01/12/17 0020)  fentaNYL (SUBLIMAZE) injection 50 mcg (50 mcg Intravenous Given 01/12/17 0148)     Initial Impression / Assessment and Plan / ED Course  I have reviewed the triage vital signs and the nursing notes.  Pertinent labs & imaging results that were available during my care of the patient were reviewed by me and considered in my medical decision making (see chart for details).    [age]. male presents via EMS for acute hypercapnic hypoxic respiratory distress. Concern for CHF or COPD exacerbation or possible CAP. On arrival,  sating high 80s on BiPAP, GCS 3. Intubated via RSI as documented above to protect airway. Noted to be in Afib with HR ranging from 50's - 110s.  - ABG 7.19/87/109/34 - lactic acidosis 5.05 - CXR large left opacity concerning for PNA. Code sepsis initiated. Blood cx obtained and empiric Rocephin and Azithromycin for CAP - AKI Cr 1.44 from 0.98 - INR therapeutic - 1L NS bolus given, conservative fluids given hx CHF and rales on exam - Hypotension shortly after RSI, minimally responsive to IVF. Levophed gtt initiated.  Pt admitted to ICU for acute hypercapnic hypoxic respiratory failure and septic shock due to  community acquired pneumonia.   Final Clinical Impressions(s) / ED Diagnoses   Final diagnoses:  Acute respiratory failure with hypoxia and hypercapnia ([place])  Community acquired pneumonia, unspecified laterality    New Prescriptions Current Discharge Medication List       Monico Blitz, MD 01/12/17 0210    Orlie Dakin, MD 01/13/17 1055

## 2017-01-11 NOTE — ED Triage Notes (Signed)
PER EMS: pt from home, wife called out with c/o respiratory distress and EMS found pt minimally responsive, leaning to the left, RR labored, pt not following commands. Pt arrives on CPAP with EMS

## 2017-01-11 NOTE — ED Notes (Signed)
MD notified of BP and critical care bedside

## 2017-01-11 NOTE — Code Documentation (Signed)
TIME OUT FOR INTUBATION FOR RESPIRATORY FAILURE

## 2017-01-11 NOTE — ED Notes (Signed)
Family in waiting room 

## 2017-01-11 NOTE — H&P (Signed)
PULMONARY / CRITICAL CARE MEDICINE   Name: Logan [name] MRN: [id number] DOB: [birth year]    ADMISSION DATE:  01/16/2017  CHIEF COMPLAINT:  Non-responsive  HISTORY OF PRESENT ILLNESS:   83yo male with very severe COPD with emphysema (2L home O2), Afib (on metoprolol and warfarin), HFpEF, Pulmonary HTN (WHO 3), who presented today BIBA after 1 day of SOB, DOE and increased respiratory distress.    Wife provided HPI.  She seems rather confused about the events and is a poor historian, but states: yesterday evening he began having increased SOB and DOE with wheezing and increased home O2 requirments (upto 5L).  He slept the night and today she stated he slept most of the day.  He was arousible and would speak clearly but then go back to sleep.  She stated his O2 fell off at one point and his saturation dropped.  He told her he was not feeling well and she was concerned so called 911.  In the ED he was somnolent and minimally responsive, intubated with peri-intubation hypotension responsive to levophed.  PAST MEDICAL HISTORY :   has a past medical history of Cancer [facility]); CHF (congestive heart failure) ([place]); Chronic anticoagulation; Chronic atrial fibrillation (HCC); COPD (chronic obstructive pulmonary disease) ([place]); Dilated cardiomyopathy ([place]); Hyperlipidemia; Hypothyroidism; PVC's (premature ventricular contractions); Renal calculi; and Shortness of breath.  has a past surgical history that includes Cardiac catheterization (02/26/2007); Appendectomy; Tonsillectomy and adenoidectomy; US ECHOCARDIOGRAPHY (07/08/2010); US ECHOCARDIOGRAPHY (02/16/2007); transthoracic echocardiogram (08/26/2001); Cardiovascular stress test (02/18/2007); Microlaryngoscopy with co2 laser and excision of vocal cord lesion; Lithotripsy; and Inguinal hernia repair (Right, 12/28/2012). Prior to Admission medications   Medication Sig Start Date End Date Taking? Authorizing Provider  ANORO ELLIPTA 62.5-25 MCG/INH AEPB INHALE 1  PUFF INTO THE LUNGS DAILY. 09/26/16   Tanda Rockers, MD  ANORO ELLIPTA 62.5-25 MCG/INH AEPB INHALE 1 PUFF INTO THE LUNGS DAILY. 10/23/16   Tanda Rockers, MD  Calcium-Magnesium-Zinc (CAL-MAG-ZINC PO) Take 1 tablet by mouth every morning.     Historical Provider, MD  dextromethorphan-guaiFENesin (MUCINEX DM) 30-600 MG per 12 hr tablet Take 1-2 tablets by mouth every 12 (twelve) hours as needed (cough, congestion).    Historical Provider, MD  digoxin (LANOXIN) 0.125 MG tablet TAKE 1 TABLET (0.125 MG TOTAL) BY MOUTH DAILY. 09/03/16   Peter M Martinique, MD  Fluticasone-Umeclidin-Vilant (TRELEGY ELLIPTA) 100-62.5-25 MCG/INH AEPB Inhale 1 puff into the lungs daily at 6 (six) AM. 09/24/16   Tanda Rockers, MD  furosemide (LASIX) 40 MG tablet Take 40 mg by mouth daily. May take 1 extra daily as needed for leg swelling 03/27/14   Peter M Martinique, MD  levothyroxine (SYNTHROID, LEVOTHROID) 75 MCG tablet TAKE 1 TABLET (75 MCG TOTAL) BY MOUTH DAILY. 10/22/16   Peter M Martinique, MD  losartan (COZAAR) 25 MG tablet Take 25 mg by mouth daily.  12/14/13   Historical Provider, MD  Multiple Vitamin (MULTIVITAMIN) tablet Take 1 tablet by mouth daily.     Historical Provider, MD  OXYGEN Use 2L at bedtime and have with activity for shortness of breath    Historical Provider, MD  PROAIR HFA 108 (90 BASE) MCG/ACT inhaler Inhale 2 puffs into the lungs every 4 (four) hours as needed for wheezing or shortness of breath.  03/02/14   Historical Provider, MD  tamsulosin (FLOMAX) 0.4 MG CAPS capsule Take 0.4 mg by mouth daily after breakfast.     Historical Provider, MD  umeclidinium-vilanterol (ANORO ELLIPTA) 62.5-25 MCG/INH AEPB Inhale 1 puff  into the lungs daily. For shortness of breath per patient     Historical Provider, MD  warfarin (COUMADIN) 3 MG tablet TAKE 1 TO 1.5 TABLETS DAILY AS DIRECTED BY COUMADIN CLINIC 01/09/17   Peter M Martinique, MD   No Known Allergies  FAMILY HISTORY:  indicated that his mother is deceased. He indicated that  his father is deceased. He indicated that his brother is deceased. He indicated that his maternal grandmother is deceased. He indicated that his maternal grandfather is deceased. He indicated that his paternal grandmother is deceased. He indicated that his paternal grandfather is deceased.   SOCIAL HISTORY:  reports that he quit smoking about 55 years ago. His smoking use included Cigarettes. He has a 35.00 pack-year smoking history. He has never used smokeless tobacco. He reports that he does not drink alcohol or use drugs.  REVIEW OF SYSTEMS:  Per patient's wife: Positive for: SOB, DOE, wheezing, increased O2 requirments, Increased LE edema Negative for: Fevers, chills, n/v/d, dysuria, abdominal pain, chest pain, sick contacts, heat/cold intolerance  SUBJECTIVE:   VITAL SIGNS: Temp:  [98.5 F (36.9 C)] 98.5 F (36.9 C) (03/25 1841) Pulse Rate:  [40-136] 40 (03/25 2046) Resp:  [14-32] 20 (03/25 2052) BP: (52-126)/(42-84) 65/50 (03/25 2052) SpO2:  [85 %-100 %] 97 % (03/25 2046) FiO2 (%):  [80 %-100 %] 80 % (03/25 2000) Weight:  [56.7 kg (125 lb)] 56.7 kg (125 lb) (03/25 1842) HEMODYNAMICS:   VENTILATOR SETTINGS: Vent Mode: PRVC FiO2 (%):  [80 %-100 %] 80 % Set Rate:  [14 bmp-20 bmp] 20 bmp Vt Set:  [560 mL] 560 mL PEEP:  [5 cmH20] 5 cmH20 Plateau Pressure:  [19 cmH20-20 cmH20] 20 cmH20 INTAKE / OUTPUT:  Intake/Output Summary (Last 24 hours) at 12/28/2016 2118 Last data filed at 12/18/2016 1941  Gross per 24 hour  Intake             1000 ml  Output                0 ml  Net             1000 ml    PHYSICAL EXAMINATION: General:  Sedated, intubated, + cachexia Neuro:  Woke up to voice, noded appropriately moved all extremities anti-gravity spontaneous. GCS 11T HEENT:  ETT in place.  Pupils 39mm and reactive Cardiovascular:  Irreg rhythm, rate controlled, no m/r/g Lungs:  Diffuse, bilateral decreased air movement, faint wheezing Abdomen:  Soft, NT, ND, normal bowel  sounds Musculoskeletal:  Bilateral atrophy and muscle wasting Skin:  Thin, b/l LE 2+ pitting edema.  LABS:  CBC  Recent Labs Lab 12/22/2016 1846 01/15/2017 1902  WBC 17.6*  --   HGB 15.0 17.0  HCT 47.3 50.0  PLT 231  --    Coag's  Recent Labs Lab 01/08/2017 1846  INR 2.38   BMET  Recent Labs Lab 01/04/2017 1846 12/21/2016 1902  NA 142 142  K 4.9 4.8  CL 99* 100*  CO2 32  --   BUN 24* 31*  CREATININE 1.44* 1.30*  GLUCOSE 212* 206*   Electrolytes  Recent Labs Lab 12/23/2016 1846  CALCIUM 9.5   Sepsis Markers  Recent Labs Lab 01/01/2017 1902  LATICACIDVEN 5.05*   ABG  Recent Labs Lab 01/10/2017 1959  PHART 7.196*  PCO2ART 87.8*  PO2ART 109.0*   Liver Enzymes  Recent Labs Lab 01/12/2017 1846  AST 39  ALT 30  ALKPHOS 65  BILITOT 0.9  ALBUMIN 3.4*   Cardiac Enzymes No  results for input(s): TROPONINI, PROBNP in the last 168 hours. Glucose No results for input(s): GLUCAP in the last 168 hours.  Imaging Dg Chest Portable 1 View  Result Date: 01/10/2017 CLINICAL DATA:  83 year old male who was reported in respiratory distress by his wife, and EMS found him minimally responsive. Intubated in the emergency department. EXAM: PORTABLE CHEST 1 VIEW COMPARISON:  Chest radiographs 02/11/2016 FINDINGS: Portable AP supine view at 1854 hours. Endotracheal tube tip in good position between the level the clavicles and carina. An enteric tube has been placed and courses to the abdomen, tip not included. Partially visible gaseous distension of the stomach. There is a large area of confluent abnormal pulmonary opacity in the left mid and lower lung. No superimposed pneumothorax. There is evidence of superimposed emphysema. No pulmonary edema. The right lung appears stable since 2017 when allowing for portable technique. Stable cardiac size and mediastinal contours. Calcified aortic atherosclerosis. IMPRESSION: 1. Endotracheal tube tip in good position. Enteric tube courses to the  abdomen, tip not included. 2. Large confluent area of left mid and lower lung opacity. Although nonspecific but top differential considerations in this clinical setting are pneumonia and large aspiration. 3. Underlying pulmonary hyperinflation with emphysema. 4. Calcified aortic atherosclerosis. Electronically Signed   By: Genevie Ann M.D.   On: 01/16/2017 19:29     ASSESSMENT / PLAN: 83 yo CM with very severe COPD admitted with acute on chronic hypercapnic and hypoxemic respiratory failure 2/2 AECOPD 2/2 CAP.  PULMONARY  A: Acute on chronic hypercapneic respiratory failure AECOPD Intubated/mechanical ventillation.  CAP  P:   - ETT good position - PRVC currently acceptable - Low tidal volume ventilation - CTX/azithromycin - Ventilator management bundle  - Sputum cultures - Duonebs Q4h scheduled - Solumedrol 60mg  IV Qday x5 days - Pulmicort nebulized BID - Holding brovanna given AFib  CARDIOVASCULAR  A:  Septic shock HFpEF - decompensated Pulmonary HTN (WHO 3) Afib - now rate controlled  P:  - S/p 2L volume resuscitation  - Trend lactic acid levels - Norepinephrine  - Maintain MAP >65 - Holding metoprolol - Holding warfarin for now - INR at goal - Holding digoxin- pharmacy to redose in AM - Dig Level - therapeutic - Currently rate controlled - Volume overloaded but given shock will hold diuresis  - BNP elevated   RENAL A:   AKI (baseline 0.95) now Scr 1.44 - 2/2 shock P:   - s/p volume resucitation - Trend Scr - no indication for emergent HD  GASTROINTESTINAL A:   Not active P:   - Stress ulcer ppx  HEMATOLOGIC A:   Leukocytosis  P:  - Sepsis treatment as below  INFECTIOUS A:   -Septic shock - likely CAP  P:   BCx2 01/12/2017 UC 01/12/2017 Sputum 01/12/2017 Abx: CTX/azithromycin, start date3/26/2018  ENDOCRINE A:    Hyperglycemia   P:   - SSI - Goal BG <180  NEUROLOGIC A:   Sedated P:   RASS goal: 0 - intermittent fentanyl  Code  status: Full DVT ppx: holding given therapeutic INR Diet: Tube feeds if not extubated tomorrow    FAMILY  - Updates: Discussed with wife at bedside  Total critical care time: 30 min  Critical care time was exclusive of separately billable procedures and treating other patients.  Critical care was necessary to treat or prevent imminent or life-threatening deterioration.  Critical care was time spent personally by me on the following activities: development of treatment plan with patient and/or surrogate  as well as nursing, discussions with consultants, evaluation of patient's response to treatment, examination of patient, obtaining history from patient or surrogate, ordering and performing treatments and interventions, ordering and review of laboratory studies, ordering and review of radiographic studies, pulse oximetry and re-evaluation of patient's condition.   Meribeth Mattes, DO., MS [facility]     Pulmonary and [place] Pager: [phone]  01/02/2017, 9:18 PM

## 2017-01-12 ENCOUNTER — Inpatient Hospital Stay (HOSPITAL_COMMUNITY): Payer: Medicare Other

## 2017-01-12 DIAGNOSIS — I4891 Unspecified atrial fibrillation: Secondary | ICD-10-CM

## 2017-01-12 DIAGNOSIS — Z7189 Other specified counseling: Secondary | ICD-10-CM

## 2017-01-12 DIAGNOSIS — Z515 Encounter for palliative care: Secondary | ICD-10-CM

## 2017-01-12 DIAGNOSIS — J9600 Acute respiratory failure, unspecified whether with hypoxia or hypercapnia: Secondary | ICD-10-CM

## 2017-01-12 DIAGNOSIS — J13 Pneumonia due to Streptococcus pneumoniae: Secondary | ICD-10-CM

## 2017-01-12 DIAGNOSIS — J441 Chronic obstructive pulmonary disease with (acute) exacerbation: Secondary | ICD-10-CM

## 2017-01-12 DIAGNOSIS — J9602 Acute respiratory failure with hypercapnia: Secondary | ICD-10-CM

## 2017-01-12 DIAGNOSIS — J9601 Acute respiratory failure with hypoxia: Secondary | ICD-10-CM

## 2017-01-12 LAB — POCT I-STAT 3, ART BLOOD GAS (G3+)
ACID-BASE EXCESS: 2 mmol/L (ref 0.0–2.0)
ACID-BASE EXCESS: 3 mmol/L — AB (ref 0.0–2.0)
Acid-base deficit: 1 mmol/L (ref 0.0–2.0)
BICARBONATE: 29.5 mmol/L — AB (ref 20.0–28.0)
BICARBONATE: 31.5 mmol/L — AB (ref 20.0–28.0)
BICARBONATE: 32.5 mmol/L — AB (ref 20.0–28.0)
O2 Saturation: 90 %
O2 Saturation: 91 %
O2 Saturation: 92 %
PO2 ART: 80 mmHg — AB (ref 83.0–108.0)
PO2 ART: 70 mmHg — AB (ref 83.0–108.0)
TCO2: 32 mmol/L (ref 0–100)
TCO2: 33 mmol/L (ref 0–100)
TCO2: 35 mmol/L (ref 0–100)
pCO2 arterial: 62 mmHg — ABNORMAL HIGH (ref 32.0–48.0)
pCO2 arterial: 78.1 mmHg (ref 32.0–48.0)
pCO2 arterial: 81.3 mmHg (ref 32.0–48.0)
pH, Arterial: 7.186 — CL (ref 7.350–7.450)
pH, Arterial: 7.21 — ABNORMAL LOW (ref 7.350–7.450)
pH, Arterial: 7.314 — ABNORMAL LOW (ref 7.350–7.450)
pO2, Arterial: 77 mmHg — ABNORMAL LOW (ref 83.0–108.0)

## 2017-01-12 LAB — RESPIRATORY PANEL BY PCR
ADENOVIRUS-RVPPCR: NOT DETECTED
Bordetella pertussis: NOT DETECTED
CHLAMYDOPHILA PNEUMONIAE-RVPPCR: NOT DETECTED
CORONAVIRUS NL63-RVPPCR: NOT DETECTED
CORONAVIRUS OC43-RVPPCR: NOT DETECTED
Coronavirus 229E: NOT DETECTED
Coronavirus HKU1: NOT DETECTED
INFLUENZA B-RVPPCR: NOT DETECTED
Influenza A: NOT DETECTED
Metapneumovirus: NOT DETECTED
Mycoplasma pneumoniae: NOT DETECTED
PARAINFLUENZA VIRUS 3-RVPPCR: NOT DETECTED
PARAINFLUENZA VIRUS 4-RVPPCR: NOT DETECTED
Parainfluenza Virus 1: NOT DETECTED
Parainfluenza Virus 2: NOT DETECTED
RESPIRATORY SYNCYTIAL VIRUS-RVPPCR: NOT DETECTED
RHINOVIRUS / ENTEROVIRUS - RVPPCR: NOT DETECTED

## 2017-01-12 LAB — BASIC METABOLIC PANEL
ANION GAP: 10 (ref 5–15)
BUN: 25 mg/dL — ABNORMAL HIGH (ref 6–20)
CO2: 30 mmol/L (ref 22–32)
CREATININE: 1.24 mg/dL (ref 0.61–1.24)
Calcium: 8.4 mg/dL — ABNORMAL LOW (ref 8.9–10.3)
Chloride: 103 mmol/L (ref 101–111)
GFR calc non Af Amer: 49 mL/min — ABNORMAL LOW (ref >60)
GFR, EST AFRICAN AMERICAN: 57 mL/min — AB (ref >60)
Glucose, Bld: 152 mg/dL — ABNORMAL HIGH (ref 65–99)
POTASSIUM: 4 mmol/L (ref 3.5–5.1)
Sodium: 143 mmol/L (ref 135–145)

## 2017-01-12 LAB — BLOOD CULTURE ID PANEL (REFLEXED)
Acinetobacter baumannii: NOT DETECTED
CANDIDA ALBICANS: NOT DETECTED
CANDIDA GLABRATA: NOT DETECTED
CANDIDA TROPICALIS: NOT DETECTED
Candida krusei: NOT DETECTED
Candida parapsilosis: NOT DETECTED
ENTEROBACTER CLOACAE COMPLEX: NOT DETECTED
ENTEROBACTERIACEAE SPECIES: NOT DETECTED
ENTEROCOCCUS SPECIES: NOT DETECTED
Escherichia coli: NOT DETECTED
HAEMOPHILUS INFLUENZAE: NOT DETECTED
KLEBSIELLA PNEUMONIAE: NOT DETECTED
Klebsiella oxytoca: NOT DETECTED
Listeria monocytogenes: NOT DETECTED
NEISSERIA MENINGITIDIS: NOT DETECTED
PSEUDOMONAS AERUGINOSA: NOT DETECTED
Proteus species: NOT DETECTED
STREPTOCOCCUS PNEUMONIAE: DETECTED — AB
STREPTOCOCCUS PYOGENES: NOT DETECTED
STREPTOCOCCUS SPECIES: DETECTED — AB
Serratia marcescens: NOT DETECTED
Staphylococcus aureus (BCID): NOT DETECTED
Staphylococcus species: NOT DETECTED
Streptococcus agalactiae: NOT DETECTED

## 2017-01-12 LAB — GLUCOSE, CAPILLARY
GLUCOSE-CAPILLARY: 128 mg/dL — AB (ref 65–99)
GLUCOSE-CAPILLARY: 166 mg/dL — AB (ref 65–99)
GLUCOSE-CAPILLARY: 169 mg/dL — AB (ref 65–99)
Glucose-Capillary: 112 mg/dL — ABNORMAL HIGH (ref 65–99)

## 2017-01-12 LAB — RENAL FUNCTION PANEL
ALBUMIN: 2.6 g/dL — AB (ref 3.5–5.0)
ANION GAP: 10 (ref 5–15)
BUN: 25 mg/dL — AB (ref 6–20)
CHLORIDE: 105 mmol/L (ref 101–111)
CO2: 30 mmol/L (ref 22–32)
Calcium: 8.5 mg/dL — ABNORMAL LOW (ref 8.9–10.3)
Creatinine, Ser: 1.17 mg/dL (ref 0.61–1.24)
GFR, EST NON AFRICAN AMERICAN: 53 mL/min — AB (ref >60)
Glucose, Bld: 133 mg/dL — ABNORMAL HIGH (ref 65–99)
PHOSPHORUS: 2.8 mg/dL (ref 2.5–4.6)
Potassium: 3.7 mmol/L (ref 3.5–5.1)
Sodium: 145 mmol/L (ref 135–145)

## 2017-01-12 LAB — MAGNESIUM
MAGNESIUM: 2 mg/dL (ref 1.7–2.4)
Magnesium: 2.1 mg/dL (ref 1.7–2.4)

## 2017-01-12 LAB — CBC
HCT: 43.7 % (ref 39.0–52.0)
Hemoglobin: 13.5 g/dL (ref 13.0–17.0)
MCH: 32.4 pg (ref 26.0–34.0)
MCHC: 30.9 g/dL (ref 30.0–36.0)
MCV: 104.8 fL — AB (ref 78.0–100.0)
PLATELETS: 192 10*3/uL (ref 150–400)
RBC: 4.17 MIL/uL — AB (ref 4.22–5.81)
RDW: 15.7 % — ABNORMAL HIGH (ref 11.5–15.5)
WBC: 8.3 10*3/uL (ref 4.0–10.5)

## 2017-01-12 LAB — PHOSPHORUS
PHOSPHORUS: 2.9 mg/dL (ref 2.5–4.6)
PHOSPHORUS: 2.9 mg/dL (ref 2.5–4.6)

## 2017-01-12 LAB — LACTIC ACID, PLASMA: Lactic Acid, Venous: 4.1 mmol/L (ref 0.5–1.9)

## 2017-01-12 LAB — PROTIME-INR
INR: 3.61
PROTHROMBIN TIME: 36.9 s — AB (ref 11.4–15.2)

## 2017-01-12 LAB — INFLUENZA PANEL BY PCR (TYPE A & B)
Influenza A By PCR: NEGATIVE
Influenza B By PCR: NEGATIVE

## 2017-01-12 LAB — TROPONIN I
TROPONIN I: 0.07 ng/mL — AB (ref <0.03)
Troponin I: 0.06 ng/mL (ref <0.03)
Troponin I: 0.07 ng/mL (ref <0.03)

## 2017-01-12 LAB — STREP PNEUMONIAE URINARY ANTIGEN: Strep Pneumo Urinary Antigen: POSITIVE — AB

## 2017-01-12 MED ORDER — SODIUM BICARBONATE 8.4 % IV SOLN
100.0000 meq | Freq: Once | INTRAVENOUS | Status: AC
Start: 2017-01-12 — End: 2017-01-12
  Administered 2017-01-12: 100 meq via INTRAVENOUS
  Filled 2017-01-12: qty 50

## 2017-01-12 MED ORDER — AZITHROMYCIN 200 MG/5ML PO SUSR: 500.0000 mg | Freq: Every day | ORAL | Status: DC

## 2017-01-12 MED ORDER — VITAL HIGH PROTEIN PO LIQD
1000.0000 mL | ORAL | Status: DC
Start: 2017-01-12 — End: 2017-01-12

## 2017-01-12 MED ORDER — DEXTROSE 5 % IV SOLN
2.0000 g | Freq: Every day | INTRAVENOUS | Status: DC
  Administered 2017-01-12 – 2017-01-14 (×3): 2 g via INTRAVENOUS
  Filled 2017-01-12 (×3): qty 2

## 2017-01-12 MED ORDER — MIDAZOLAM HCL 2 MG/2ML IJ SOLN: 2.0000 mg | INTRAMUSCULAR | Status: DC | PRN

## 2017-01-12 MED ORDER — VITAL AF 1.2 CAL PO LIQD
1000.0000 mL | ORAL | Status: DC
  Administered 2017-01-12 – 2017-01-15 (×4): 1000 mL
  Filled 2017-01-12: qty 1000

## 2017-01-12 MED ORDER — FENTANYL CITRATE (PF) 100 MCG/2ML IJ SOLN
50.0000 ug | Freq: Once | INTRAMUSCULAR | Status: AC
  Administered 2017-01-12: 50 ug via INTRAVENOUS

## 2017-01-12 MED ORDER — POTASSIUM CHLORIDE 20 MEQ/15ML (10%) PO SOLN
20.0000 meq | Freq: Once | ORAL | Status: AC
  Administered 2017-01-12: 20 meq
  Filled 2017-01-12: qty 15

## 2017-01-12 MED ORDER — CHLORHEXIDINE GLUCONATE CLOTH 2 % EX PADS
6.0000 | MEDICATED_PAD | Freq: Every day | CUTANEOUS | Status: DC
  Administered 2017-01-12 – 2017-01-17 (×6): 6 via TOPICAL

## 2017-01-12 MED ORDER — SODIUM BICARBONATE 8.4 % IV SOLN
100.0000 meq | Freq: Once | INTRAVENOUS | Status: DC
  Filled 2017-01-12: qty 100

## 2017-01-12 MED ORDER — SODIUM BICARBONATE 8.4 % IV SOLN
INTRAVENOUS | Status: AC
  Administered 2017-01-12: 1 meq/L
  Filled 2017-01-12: qty 50

## 2017-01-12 MED ORDER — FAMOTIDINE 40 MG/5ML PO SUSR
20.0000 mg | Freq: Every day | ORAL | Status: DC
  Administered 2017-01-13 – 2017-01-15 (×3): 20 mg
  Filled 2017-01-12 (×4): qty 2.5

## 2017-01-12 MED ORDER — SODIUM CHLORIDE 0.9 % IV SOLN
0.0000 ug/min | INTRAVENOUS | Status: DC
  Administered 2017-01-12: 50 ug/min via INTRAVENOUS
  Administered 2017-01-12 (×2): 70 ug/min via INTRAVENOUS
  Administered 2017-01-12 (×2): 50 ug/min via INTRAVENOUS
  Filled 2017-01-12 (×6): qty 1

## 2017-01-12 MED ORDER — DOCUSATE SODIUM 50 MG/5ML PO LIQD: 100.0000 mg | Freq: Two times a day (BID) | ORAL | Status: DC | PRN

## 2017-01-12 MED ORDER — VITAL AF 1.2 CAL PO LIQD: 1000.0000 mL | ORAL | Status: DC

## 2017-01-12 MED ORDER — SODIUM CHLORIDE 0.9 % IV BOLUS (SEPSIS)
500.0000 mL | Freq: Once | INTRAVENOUS | Status: AC
  Administered 2017-01-12: 500 mL via INTRAVENOUS

## 2017-01-12 MED ORDER — MIDAZOLAM HCL 2 MG/2ML IJ SOLN
2.0000 mg | INTRAMUSCULAR | Status: DC | PRN
  Filled 2017-01-12: qty 2

## 2017-01-12 MED ORDER — SODIUM CHLORIDE 0.9% FLUSH
10.0000 mL | INTRAVENOUS | Status: DC | PRN
  Administered 2017-01-14 – 2017-01-17 (×2): 10 mL
  Filled 2017-01-12 (×2): qty 40

## 2017-01-12 MED ORDER — FENTANYL BOLUS VIA INFUSION
50.0000 ug | INTRAVENOUS | Status: DC | PRN
  Administered 2017-01-12 – 2017-01-14 (×5): 50 ug via INTRAVENOUS
  Filled 2017-01-12: qty 50

## 2017-01-12 MED ORDER — FENTANYL 2500MCG IN NS 250ML (10MCG/ML) PREMIX INFUSION
25.0000 ug/h | INTRAVENOUS | Status: DC
  Administered 2017-01-12: 75 ug/h via INTRAVENOUS
  Administered 2017-01-13 – 2017-01-15 (×2): 50 ug/h via INTRAVENOUS
  Filled 2017-01-12 (×3): qty 250

## 2017-01-12 MED ORDER — DIGOXIN 125 MCG PO TABS
0.1250 mg | ORAL_TABLET | Freq: Every day | ORAL | Status: DC
  Administered 2017-01-12 – 2017-01-15 (×4): 0.125 mg
  Filled 2017-01-12 (×5): qty 1

## 2017-01-12 MED ORDER — SODIUM CHLORIDE 0.9 % IV SOLN
0.0000 ug/min | INTRAVENOUS | Status: DC
  Administered 2017-01-12: 100 ug/min via INTRAVENOUS
  Administered 2017-01-12: 80 ug/min via INTRAVENOUS
  Administered 2017-01-13: 40 ug/min via INTRAVENOUS
  Administered 2017-01-13: 100 ug/min via INTRAVENOUS
  Filled 2017-01-12 (×5): qty 4

## 2017-01-12 MED ORDER — AZITHROMYCIN 200 MG/5ML PO SUSR: 250.0000 mg | ORAL | Status: DC

## 2017-01-12 MED ORDER — SODIUM CHLORIDE 0.9% FLUSH
10.0000 mL | Freq: Two times a day (BID) | INTRAVENOUS | Status: DC
  Administered 2017-01-12 – 2017-01-16 (×8): 10 mL
  Administered 2017-01-17: 20 mL

## 2017-01-12 MED FILL — Medication: Qty: 1 | Status: AC

## 2017-01-12 NOTE — Procedures (Signed)
Arterial Catheter Insertion Procedure Note Logan [name] [id number] [birth year]  Procedure: Insertion of Arterial Catheter  Indications: Blood pressure monitoring and Frequent blood sampling  Procedure Details Consent: Unable to obtain consent because of altered level of consciousness. Time Out: Verified patient identification, verified procedure, site/side was marked, verified correct patient position, special equipment/implants available, medications/allergies/relevent history reviewed, required imaging and test results available.  Performed  Maximum sterile technique was used including antiseptics, cap, gloves, gown, hand hygiene, mask and sheet. Skin prep: Chlorhexidine; local anesthetic administered 20 gauge catheter was inserted into right radial artery using the Seldinger technique.  Evaluation Blood flow good; BP tracing good. Complications: No apparent complications.   Wynelle Beckmann 01/12/2017

## 2017-01-12 NOTE — Progress Notes (Addendum)
Initial Nutrition Assessment  DOCUMENTATION CODES:   Underweight, Severe malnutrition in context of chronic illness  INTERVENTION:    Vital AF 1.2 at 20 ml/h, increase by 10 ml every 8 hours to goal of 50 ml/h (1200 ml per day)   Provides 1440 kcal, 90 gm protein, 973 ml free water daily   Monitor magnesium, potassium, and phosphorus daily for at least 3 days, MD to replete as needed, as pt is at risk for refeeding syndrome given malnutrition.  NUTRITION DIAGNOSIS:   Malnutrition related to chronic illness as evidenced by severe depletion of body fat, severe depletion of muscle mass.  GOAL:   Patient will meet greater than or equal to 90% of their needs  MONITOR:   Vent status, TF tolerance, Labs, I & O's  REASON FOR ASSESSMENT:   Consult Enteral/tube feeding initiation and management  ASSESSMENT:   83 yo male with severe COPD admitted on 3/25 with acute on chronic hypercapnic and hypoxemic respiratory failure 2/2 AECOPD 2/2 pneumococcal LLL CAP.  Received MD Consult for TF initiation and management. Nutrition-Focused physical exam completed. Findings are severe fat depletion, severe muscle depletion, and no edema. His usual weight is in the 160's (years ago). From review of usual weights, weight has been stable (120-128 lbs) for the past few years. Patient with severe PCM. Patient is underweight with BMI = 18.46. Patient is currently intubated on ventilator support MV: 11.9 L/min Temp (24hrs), Avg:98 F (36.7 C), Min:97.5 F (36.4 C), Max:98.5 F (36.9 C)   Diet Order:   NPO  Skin:  Reviewed, no issues  Last BM:  PTA  Height:   Ht Readings from Last 1 Encounters:  12/27/2016 5\' 9"  (1.753 m)    Weight:   Wt Readings from Last 1 Encounters:  12/30/2016 125 lb (56.7 kg)    Ideal Body Weight:  72.7 kg  BMI:  Body mass index is 18.46 kg/m.  Estimated Nutritional Needs:   Kcal:  1495  Protein:  80-90 gm  Fluid:  1.5-1.8 L  EDUCATION NEEDS:    No education needs identified at this time  Molli Barrows, [place], [place], [place] Pager [phone] After Hours Pager [phone]

## 2017-01-12 NOTE — Progress Notes (Signed)
eLink Physician-Brief Progress Note Patient Name: MORDCHE HEDGLIN DOB: 02/22/1925 MRN: [id number]   Date of Service  01/12/2017  HPI/Events of Note  3 beat V tach run and then tachy with PVCs per RN  Now EKG with A Fib wigt PVC - baseline x 1 year currentl 108-116  On neo   eICU Interventions   No results for input(s): TROPONINI in the last 168 hours.  Recent Labs Lab 01/08/2017 1846 01/10/2017 1902 12/30/2016 2143 01/12/17 0245  NA 142 142  --  143  K 4.9 4.8  --  4.0  CL 99* 100*  --  103  CO2 32  --   --  30  GLUCOSE 212* 206*  --  152*  BUN 24* 31*  --  25*  CREATININE 1.44* 1.30* 1.33* 1.24  CALCIUM 9.5  --   --  8.4*  MG  --   --  2.3  --   PHOS  --   --  5.7*  --    awaiot mag and phos trop Hold off amio for now     Intervention Category Major Interventions: Arrhythmia - evaluation and management  Flora Parks 01/12/2017, 5:35 AM

## 2017-01-12 NOTE — Progress Notes (Signed)
eLink Physician-Brief Progress Note Patient Name: Logan [name] DOB: 03-Mar-1925 MRN: [id number]   Date of Service  01/12/2017  HPI/Events of Note  Severe resp acidosis on ards protocl with 7cc and RR 16  eICU Interventions  Increase rr to 24 bic push x 1` Check abg in 1h     Intervention Category Major Interventions: Acid-Base disturbance - evaluation and management Minor Interventions: Other:  Yarisbel Miranda 01/12/2017, 12:02 AM

## 2017-01-12 NOTE — Progress Notes (Signed)
eLink Physician-Brief Progress Note Patient Name: Logan [name] DOB: [birth year] MRN: [id number]   Date of Service  01/12/2017  HPI/Events of Note  hyptensive after fluid bolus + pH 7/18  eICU Interventions  bic push 500cc fluid bolus Start neo     Intervention Category Major Interventions: Hypotension - evaluation and management Minor Interventions: Other:  [name] 01/12/2017, 12:05 AM

## 2017-01-12 NOTE — Consult Note (Signed)
Consultation Note Date: 01/12/2017   Patient Name: Logan [name]  DOB: [birth year]  MRN: [id number]  Age / Sex: 83 y.o., male  PCP: Leonard Downing, MD Referring Physician: Rigoberto Noel, MD  Reason for Consultation: Establishing goals of care  HPI/Patient Profile: 83 y.o. male  with past medical history of severe COPD with emphysema (on 2L home O2), a fib, CHF, pulmonary HTN, solitary lung nodule, admitted on 01/01/2017 with respiratory distress requiring intubation. Workup has revealed urinary antigen positive strep pneumonia. He was managed for treatment, palliative medicine consulted for [place].   Clinical Assessment and Goals of Care: Per chart review- patient was residing at home with wife prior to admission- he had a few days of lethargy, wheezing and at some point his oxygen fell off and this lead to his coming to the ED by ambulance where he was found to be in respiratory distress.  Evaluated patient. No family present. He was awake and alert. Restraints and mittens in place. I d/c'd restraints briefly so he could communicate by writing. He denied pain. Shrugged his shoulders when I asked if he was comfortable. I was able to start a surface conversation about [place]-  Interestingly, he told me that at night he thinks about "ending it all". He wants to be able to "get up and go to the bathroom by himself", and doesn't want to live in a bed- However, after this our conversation deteriorated and he became confused, telling me he was waiting for Dr. Nelda Marseille to bring him his oxygen to room 5, and writing down incoherent dates and sentences.  I was unable to elicit GOC.  Spoke with his wife, Logan [name]- plan made for f/u meeting with her tomorrow at 1pm.  Primary Decision Maker NEXT OF KIN - spouse- Logan [name]    SUMMARY OF RECOMMENDATIONS -Continue current level of care -Family meeting tomorrow with PMT at  [place]:  Full code   Palliative Prophylaxis:   Delirium Protocol, Frequent Pain Assessment and Turn Reposition  Additional Recommendations (Limitations, Scope, Preferences):  Full Scope Treatment   Prognosis:    Unable to determine  Discharge Planning: To Be Determined  Primary Diagnoses: Present on Admission: . COPD with acute exacerbation ([place])   I have reviewed the medical record, interviewed the patient and family, and examined the patient. The following aspects are pertinent.  Past Medical History:  Diagnosis Date  . 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 CHF (congestive heart failure) ([facility])    DUE TO SYSTOLIC DYSFUNCTION  . Chronic anticoagulation   . Chronic atrial fibrillation ([place])   . COPD (chronic obstructive pulmonary disease) ([place])   . Dilated cardiomyopathy ([employer])   . Hyperlipidemia   . Hypothyroidism   . PVC's (premature ventricular contractions)   . Renal calculi   . Shortness of breath    exertional   Social History   Social History  . Marital status: Married    Spouse name: N/A  .  Number of children: 0  . Years of education: N/A   Occupational History  . electrician-retired Retired   Social History Main Topics  . Smoking status: Former Smoker    Packs/day: 1.00    Years: 35.00    Types: Cigarettes    Quit date: 07/11/1961  . Smokeless tobacco: Never Used  . Alcohol use No  . Drug use: No  . Sexual activity: Not Asked   Other Topics Concern  . None   Social History Narrative  . None   Family History  Problem Relation Age of Onset  . Stroke Mother   . Seizures Mother   . Heart attack Father   . Heart failure Father   . Heart attack Brother    Scheduled Meds: . budesonide (PULMICORT) nebulizer solution  0.25 mg Nebulization BID  . cefTRIAXone (ROCEPHIN)  IV  2 g Intravenous QHS  . chlorhexidine gluconate (MEDLINE KIT)  15 mL Mouth Rinse BID  . Chlorhexidine Gluconate Cloth  6 each  Topical Daily  . digoxin  0.125 mg Per Tube Daily  . [START ON 01/13/2017] famotidine  20 mg Per Tube Daily  . ipratropium-albuterol  3 mL Nebulization Q6H  . levothyroxine  75 mcg Per Tube QAC breakfast  . mouth rinse  15 mL Mouth Rinse QID  . methylPREDNISolone (SOLU-MEDROL) injection  60 mg Intravenous Daily  . sodium chloride flush  10-40 mL Intracatheter Q12H   Continuous Infusions: . feeding supplement (VITAL AF 1.2 CAL) 1,000 mL (01/12/17 1200)  . fentaNYL infusion INTRAVENOUS 50 mcg/hr (01/12/17 1300)  . phenylephrine (NEO-SYNEPHRINE) Adult infusion     PRN Meds:.sodium chloride, bisacodyl, docusate, fentaNYL, fentaNYL (SUBLIMAZE) injection, fentaNYL (SUBLIMAZE) injection, midazolam, midazolam, sennosides, sodium chloride flush Medications Prior to Admission:  Prior to Admission medications   Medication Sig Start Date End Date Taking? Authorizing Provider  ANORO ELLIPTA 62.5-25 MCG/INH AEPB INHALE 1 PUFF INTO THE LUNGS DAILY. 09/26/16  Yes Tanda Rockers, MD  digoxin (LANOXIN) 0.125 MG tablet TAKE 1 TABLET (0.125 MG TOTAL) BY MOUTH DAILY. 09/03/16  Yes Peter M Martinique, MD  furosemide (LASIX) 40 MG tablet Take 40 mg by mouth daily. May take 1 extra daily as needed for leg swelling 03/27/14  Yes Peter M Martinique, MD  levothyroxine (SYNTHROID, LEVOTHROID) 75 MCG tablet TAKE 1 TABLET (75 MCG TOTAL) BY MOUTH DAILY. 10/22/16  Yes Peter M Martinique, MD  losartan (COZAAR) 25 MG tablet Take 25 mg by mouth daily.  12/14/13  Yes Historical Provider, MD  tamsulosin (FLOMAX) 0.4 MG CAPS capsule Take 0.4 mg by mouth daily after breakfast.    Yes Historical Provider, MD  warfarin (COUMADIN) 3 MG tablet TAKE 1 TO 1.5 TABLETS DAILY AS DIRECTED BY COUMADIN CLINIC Patient taking differently: Take 3 mg by mouth daily on Sunday and Thursday. Take 4.5 mg by mouth daily on all other days. 01/09/17  Yes Peter M Martinique, MD  ANORO ELLIPTA 62.5-25 MCG/INH AEPB INHALE 1 PUFF INTO THE LUNGS DAILY. Patient not taking:  Reported on 01/12/2017 10/23/16   Tanda Rockers, MD  Calcium-Magnesium-Zinc (CAL-MAG-ZINC PO) Take 1 tablet by mouth every morning.     Historical Provider, MD  dextromethorphan-guaiFENesin (MUCINEX DM) 30-600 MG per 12 hr tablet Take 1-2 tablets by mouth every 12 (twelve) hours as needed (cough, congestion).    Historical Provider, MD  Fluticasone-Umeclidin-Vilant (TRELEGY ELLIPTA) 100-62.5-25 MCG/INH AEPB Inhale 1 puff into the lungs daily at 6 (six) AM. Patient not taking: Reported on 01/12/2017 09/24/16   Legrand Como  Mckinley Jewel, MD  Multiple Vitamin (MULTIVITAMIN) tablet Take 1 tablet by mouth daily.     Historical Provider, MD  OXYGEN Use 2L at bedtime and have with activity for shortness of breath    Historical Provider, MD  PROAIR HFA 108 (90 BASE) MCG/ACT inhaler Inhale 2 puffs into the lungs every 4 (four) hours as needed for wheezing or shortness of breath.  03/02/14   Historical Provider, MD   No Known Allergies Review of Systems  Unable to perform ROS: Intubated    Physical Exam  Constitutional:  Frail, elderly   Pulmonary/Chest:  Intubated   Neurological: He is alert.  confused  Skin: Skin is warm and dry.  Psychiatric:  Inappropriate thought content and judgement    Vital Signs: BP 113/85   Pulse 97   Temp 98 F (36.7 C) (Oral)   Resp (!) 24   Ht '5\' 9"'$  (1.753 m)   Wt 56.7 kg (125 lb)   SpO2 90%   BMI 18.46 kg/m  Pain Assessment: CPOT       SpO2: SpO2: 90 % O2 Device:SpO2: 90 % O2 Flow Rate: .   IO: Intake/output summary:  Intake/Output Summary (Last 24 hours) at 01/12/17 1426 Last data filed at 01/12/17 1300  Gross per 24 hour  Intake          2476.46 ml  Output              295 ml  Net          2181.46 ml    LBM: Last BM Date:  (PTA) Baseline Weight: Weight: 56.7 kg (125 lb) Most recent weight: Weight: 56.7 kg (125 lb)     Palliative Assessment/Data: PPS: 10 %   Flowsheet Rows     Most Recent Value  Intake Tab  Referral Department  -- [ED]  Unit at  Time of Referral  ER  Palliative Care Primary Diagnosis  Pulmonary  Date Notified  01/02/2017  Palliative Care Type  New Palliative care  Reason for referral  Clarify Goals of Care  Date of Admission  01/15/2017  # of days IP prior to Palliative referral  0  Clinical Assessment  Psychosocial & Spiritual Assessment  Palliative Care Outcomes      Thank you for this consult. Palliative medicine will continue to follow and assist as needed.    Time Total:70 minutes Greater than 50%  of this time was spent counseling and coordinating care related to the above assessment and plan.  Signed by: Mariana Kaufman, AGNP-C Palliative Medicine    Please contact Palliative Medicine Team phone at [phone] for questions and concerns.  For individual provider: See Shea Evans

## 2017-01-12 NOTE — Progress Notes (Signed)
PULMONARY / CRITICAL CARE MEDICINE   Name: Logan [name] MRN: [id number] DOB: [birth year]    ADMISSION DATE:  01/10/2017  CHIEF COMPLAINT:  Non-responsive  HISTORY OF PRESENT ILLNESS:   83yo male with very severe COPD with emphysema (2L home O2), Afib (on metoprolol and warfarin), HFpEF, Pulmonary HTN (WHO 3), who presented today BIBA after 1 day of SOB, DOE and increased respiratory distress.    In the ED he was somnolent and minimally responsive with acute resp acidosis, intubated with peri-intubation hypotension requiring pressors   Blood cx 3/25 >>GPC pairs >> U strep ag pos  SUBJECTIVE:  Critically ill, intubated on neo gtt UO poor  VITAL SIGNS: Temp:  [97.5 F (36.4 C)-98.5 F (36.9 C)] 97.7 F (36.5 C) (03/26 0815) Pulse Rate:  [40-139] 88 (03/26 0915) Resp:  [14-32] 21 (03/26 0915) BP: (52-146)/(42-106) 146/63 (03/26 0915) SpO2:  [83 %-100 %] 90 % (03/26 0915) Arterial Line BP: (52-140)/(23-58) 106/46 (03/26 0915) FiO2 (%):  [80 %-100 %] 80 % (03/26 0742) Weight:  [125 lb (56.7 kg)] 125 lb (56.7 kg) (03/25 1842) HEMODYNAMICS:   VENTILATOR SETTINGS: Vent Mode: PRVC FiO2 (%):  [80 %-100 %] 80 % Set Rate:  [14 bmp-24 bmp] 24 bmp Vt Set:  [490 mL-560 mL] 490 mL PEEP:  [5 cmH20] 5 cmH20 Plateau Pressure:  [18 cmH20-21 cmH20] 20 cmH20 INTAKE / OUTPUT:  Intake/Output Summary (Last 24 hours) at 01/12/17 0950 Last data filed at 01/12/17 0900  Gross per 24 hour  Intake          2123.96 ml  Output              255 ml  Net          1868.96 ml    PHYSICAL EXAMINATION: General:  Sedated, intubated, + cachexia Neuro:  Awake, alert, follows commands HEENT:  ETT in place.  Pupils 54mm and reactive Cardiovascular:  Irreg rhythm, rate controlled, no m/r/g Lungs:  Diffuse, bilateral decreased air movement, faint scattered rhonchi Abdomen:  Soft, NT, ND, normal bowel sounds Musculoskeletal:  Bilateral atrophy and muscle wasting Skin:  Thin, b/l LE 2+ pitting  edema.  LABS:  CBC  Recent Labs Lab 12/29/2016 1846 01/13/2017 1902 01/12/17 0245  WBC 17.6*  --  8.3  HGB 15.0 17.0 13.5  HCT 47.3 50.0 43.7  PLT 231  --  192   Coag's  Recent Labs Lab 12/27/2016 1846 01/12/17 0830  INR 2.38 3.61   BMET  Recent Labs Lab 01/08/2017 1846 01/15/2017 1902 12/18/2016 2143 01/12/17 0245 01/12/17 0514  NA 142 142  --  143 145  K 4.9 4.8  --  4.0 3.7  CL 99* 100*  --  103 105  CO2 32  --   --  30 30  BUN 24* 31*  --  25* 25*  CREATININE 1.44* 1.30* 1.33* 1.24 1.17  GLUCOSE 212* 206*  --  152* 133*   Electrolytes  Recent Labs Lab 01/10/2017 1846 01/15/2017 2143 01/12/17 0245 01/12/17 0514  CALCIUM 9.5  --  8.4* 8.5*  MG  --  2.3  --   --   PHOS  --  5.7*  --  2.8   Sepsis Markers  Recent Labs Lab 12/28/2016 1902 01/08/2017 2143 01/12/17 0023  LATICACIDVEN 5.05* 5.0* 4.1*   ABG  Recent Labs Lab 01/10/2017 2345 01/12/17 0103 01/12/17 0327  PHART 7.186* 7.210* 7.314*  PCO2ART 78.1* 81.3* 62.0*  PO2ART 77.0* 80.0* 70.0*   Liver Enzymes  Recent  Labs Lab 01/02/2017 1846 01/12/17 0514  AST 39  --   ALT 30  --   ALKPHOS 65  --   BILITOT 0.9  --   ALBUMIN 3.4* 2.6*   Cardiac Enzymes  Recent Labs Lab 01/12/17 0514 01/12/17 0830  TROPONINI 0.06* 0.07*   Glucose  Recent Labs Lab 01/08/2017 2232  GLUCAP 208*    Imaging Dg Chest Portable 1 View  Result Date: 12/20/2016 CLINICAL DATA:  83 year old male who was reported in respiratory distress by his wife, and EMS found him minimally responsive. Intubated in the emergency department. EXAM: PORTABLE CHEST 1 VIEW COMPARISON:  Chest radiographs 02/11/2016 FINDINGS: Portable AP supine view at 1854 hours. Endotracheal tube tip in good position between the level the clavicles and carina. An enteric tube has been placed and courses to the abdomen, tip not included. Partially visible gaseous distension of the stomach. There is a large area of confluent abnormal pulmonary opacity in the  left mid and lower lung. No superimposed pneumothorax. There is evidence of superimposed emphysema. No pulmonary edema. The right lung appears stable since 2017 when allowing for portable technique. Stable cardiac size and mediastinal contours. Calcified aortic atherosclerosis. IMPRESSION: 1. Endotracheal tube tip in good position. Enteric tube courses to the abdomen, tip not included. 2. Large confluent area of left mid and lower lung opacity. Although nonspecific but top differential considerations in this clinical setting are pneumonia and large aspiration. 3. Underlying pulmonary hyperinflation with emphysema. 4. Calcified aortic atherosclerosis. Electronically Signed   By: Genevie Ann M.D.   On: 12/18/2016 19:29     ASSESSMENT / PLAN: 83 yo CM with very severe COPD admitted with acute on chronic hypercapnic and hypoxemic respiratory failure 2/2 AECOPD 2/2 pneumococcal LLL CAP.  PULMONARY  A: Acute on chronic hypercapneic respiratory failure AECOPD Pneumococcal CAP  P:   - PRVC currently acceptable - Ventilator settings reviewed & adjusted-add PEEP 8 - Duonebs Q6h scheduled - Solumedrol 60mg  IV Qday x5 days - Pulmicort nebulized BID - Holding brovanna given AFib  CARDIOVASCULAR  A:  Septic shock HFpEF - decompensated Pulmonary HTN (WHO 3) Afib, chronic - now rate controlled NSVT  P:  -Neo gtt - Holding metoprolol - Holding warfarin for now, follow INR daily, heparin when < 2.0 - Holding digoxin- pharmacy to redose  -PICC line   RENAL A:   AKI (baseline 0.95) now Scr 1.44 - 2/2 shock P:   - s/p volume resucitation - Trend Scr   GASTROINTESTINAL A:   Protein calorie malnutrition P:   - Stress ulcer ppx -start TFs  HEMATOLOGIC A:   Leukocytosis  P:  - Sepsis treatment as below  INFECTIOUS A:   -Septic shock - likely CAP  P:   BCx2 01/12/2017 >> GPC pairs >> UC 01/12/2017 Sputum 01/12/2017 Abx: CTX start date3/26/2018 Dc azithromycin,  ENDOCRINE A:     Hyperglycemia   P:   - SSI - Goal BG <180  NEUROLOGIC A:   Sedated P:   RASS goal: 0 - intermittent fentanyl  Code status: Full    FAMILY  - Updates:  wife at bedside 3/26  Total critical care time: 30 min  Kara Mead MD. [facility] [place] [facility] Pager [phone] If no response call [phone]     01/12/2017, 9:50 AM

## 2017-01-12 NOTE — Progress Notes (Signed)
Chnges per DR Chase Caller and ABG result.  ABG will be pulled again at 1am.

## 2017-01-12 NOTE — Progress Notes (Signed)
Peripherally Inserted Central Catheter/Midline Placement  The IV Nurse has discussed with the patient and/or persons authorized to consent for the patient, the purpose of this procedure and the potential benefits and risks involved with this procedure.  The benefits include less needle sticks, lab draws from the catheter, and the patient may be discharged home with the catheter. Risks include, but not limited to, infection, bleeding, blood clot (thrombus formation), and puncture of an artery; nerve damage and irregular heartbeat and possibility to perform a PICC exchange if needed/ordered by physician.  Alternatives to this procedure were also discussed.  Bard Power PICC patient education guide, fact sheet on infection prevention and patient information card has been provided to patient /or left at bedside.    PICC/Midline Placement Documentation  PICC Triple Lumen 01/12/17 PICC Right Brachial 39 cm 0 cm (Active)  Indication for Insertion or Continuance of Line Vasoactive infusions 01/12/2017 11:00 AM  Exposed Catheter (cm) 0 cm 01/12/2017 11:00 AM  Dressing Change Due 01/19/17 01/12/2017 11:00 AM       Logan [name] 01/12/2017, 11:10 AM

## 2017-01-12 NOTE — Progress Notes (Signed)
PHARMACY - PHYSICIAN COMMUNICATION CRITICAL VALUE ALERT - BLOOD CULTURE IDENTIFICATION (BCID)  Results for orders placed or performed during the hospital encounter of 01/05/2017  Blood Culture ID Panel (Reflexed) (Collected: 12/29/2016  6:46 PM)  Result Value Ref Range   Enterococcus species NOT DETECTED NOT DETECTED   Listeria monocytogenes NOT DETECTED NOT DETECTED   Staphylococcus species NOT DETECTED NOT DETECTED   Staphylococcus aureus NOT DETECTED NOT DETECTED   Streptococcus species DETECTED (A) NOT DETECTED   Streptococcus agalactiae NOT DETECTED NOT DETECTED   Streptococcus pneumoniae DETECTED (A) NOT DETECTED   Streptococcus pyogenes NOT DETECTED NOT DETECTED   Acinetobacter baumannii NOT DETECTED NOT DETECTED   Enterobacteriaceae species NOT DETECTED NOT DETECTED   Enterobacter cloacae complex NOT DETECTED NOT DETECTED   Escherichia coli NOT DETECTED NOT DETECTED   Klebsiella oxytoca NOT DETECTED NOT DETECTED   Klebsiella pneumoniae NOT DETECTED NOT DETECTED   Proteus species NOT DETECTED NOT DETECTED   Serratia marcescens NOT DETECTED NOT DETECTED   Haemophilus influenzae NOT DETECTED NOT DETECTED   Neisseria meningitidis NOT DETECTED NOT DETECTED   Pseudomonas aeruginosa NOT DETECTED NOT DETECTED   Candida albicans NOT DETECTED NOT DETECTED   Candida glabrata NOT DETECTED NOT DETECTED   Candida krusei NOT DETECTED NOT DETECTED   Candida parapsilosis NOT DETECTED NOT DETECTED   Candida tropicalis NOT DETECTED NOT DETECTED    Name of physician (or Provider) Contacted: CCM  Changes to prescribed antibiotics required: None required  Harvel Quale 01/12/2017  11:57 AM

## 2017-01-12 NOTE — Progress Notes (Signed)
eLink Physician-Brief Progress Note Patient Name: Logan [name] DOB: 04/26/1925 MRN: [id number]   Date of Service  01/12/2017  HPI/Events of Note  Agitated despite prn sedation  eICU Interventions  Start sedation gtt     Intervention Category Minor Interventions: Agitation / anxiety - evaluation and management  Tavoris Brisk 01/12/2017, 12:59 AM

## 2017-01-13 ENCOUNTER — Inpatient Hospital Stay (HOSPITAL_COMMUNITY): Payer: Medicare Other

## 2017-01-13 DIAGNOSIS — J9602 Acute respiratory failure with hypercapnia: Secondary | ICD-10-CM

## 2017-01-13 LAB — GLUCOSE, CAPILLARY
GLUCOSE-CAPILLARY: 163 mg/dL — AB (ref 65–99)
GLUCOSE-CAPILLARY: 164 mg/dL — AB (ref 65–99)
GLUCOSE-CAPILLARY: 166 mg/dL — AB (ref 65–99)
Glucose-Capillary: 145 mg/dL — ABNORMAL HIGH (ref 65–99)
Glucose-Capillary: 158 mg/dL — ABNORMAL HIGH (ref 65–99)

## 2017-01-13 LAB — CBC
HCT: 42.5 % (ref 39.0–52.0)
Hemoglobin: 13.7 g/dL (ref 13.0–17.0)
MCH: 32.7 pg (ref 26.0–34.0)
MCHC: 32.2 g/dL (ref 30.0–36.0)
MCV: 101.4 fL — ABNORMAL HIGH (ref 78.0–100.0)
PLATELETS: 196 10*3/uL (ref 150–400)
RBC: 4.19 MIL/uL — ABNORMAL LOW (ref 4.22–5.81)
RDW: 15.9 % — ABNORMAL HIGH (ref 11.5–15.5)
WBC: 15.4 10*3/uL — ABNORMAL HIGH (ref 4.0–10.5)

## 2017-01-13 LAB — PROTIME-INR
INR: 4.51
PROTHROMBIN TIME: 44.1 s — AB (ref 11.4–15.2)

## 2017-01-13 LAB — BASIC METABOLIC PANEL
Anion gap: 8 (ref 5–15)
BUN: 48 mg/dL — AB (ref 6–20)
CHLORIDE: 105 mmol/L (ref 101–111)
CO2: 29 mmol/L (ref 22–32)
CREATININE: 1.37 mg/dL — AB (ref 0.61–1.24)
Calcium: 8.8 mg/dL — ABNORMAL LOW (ref 8.9–10.3)
GFR calc Af Amer: 51 mL/min — ABNORMAL LOW (ref >60)
GFR calc non Af Amer: 44 mL/min — ABNORMAL LOW (ref >60)
GLUCOSE: 182 mg/dL — AB (ref 65–99)
Potassium: 4.2 mmol/L (ref 3.5–5.1)
SODIUM: 142 mmol/L (ref 135–145)

## 2017-01-13 LAB — LEGIONELLA PNEUMOPHILA SEROGP 1 UR AG: L. pneumophila Serogp 1 Ur Ag: NEGATIVE

## 2017-01-13 LAB — MAGNESIUM: Magnesium: 2.3 mg/dL (ref 1.7–2.4)

## 2017-01-13 LAB — PHOSPHORUS: Phosphorus: 1.9 mg/dL — ABNORMAL LOW (ref 2.5–4.6)

## 2017-01-13 MED ORDER — K PHOS MONO-SOD PHOS DI & MONO 155-852-130 MG PO TABS
500.0000 mg | ORAL_TABLET | Freq: Two times a day (BID) | ORAL | Status: AC
  Administered 2017-01-13 (×2): 500 mg via ORAL
  Filled 2017-01-13 (×2): qty 2

## 2017-01-13 MED ORDER — POTASSIUM PHOSPHATE MONOBASIC 500 MG PO TABS
500.0000 mg | ORAL_TABLET | Freq: Two times a day (BID) | ORAL | Status: DC
  Filled 2017-01-13: qty 1

## 2017-01-13 NOTE — Care Management Note (Signed)
Case Management Note  Patient Details  Name: Logan [name] MRN: [id number] Date of Birth: 1925/04/17  Subjective/Objective:    Pt admitted with resp distress                Action/Plan:   PTA from home with wife.  Palliative following pt for goals of care.  Pt remains intubated at this time.  CM will continue to follow for discharge needs   Expected Discharge Date:  01/16/17               Expected Discharge Plan:     In-House Referral:     Discharge planning Services  CM Consult  Post Acute Care Choice:    Choice offered to:     DME Arranged:    DME Agency:     HH Arranged:    HH Agency:     Status of Service:  In process, will continue to follow  If discussed at Long Length of Stay Meetings, dates discussed:    Additional Comments:  Maryclare Labrador, RN 01/13/2017, 3:27 PM

## 2017-01-13 NOTE — Progress Notes (Signed)
   Lab alert   Recent Labs Lab 12/23/2016 1846 01/12/17 0830 01/13/17 0405  INR 2.38 3.61 4.51*    Currently not on coumaidin  No reports of bleeding or focal deficit  Plan  - bedside MD  ato addoress  Dr. Brand Males, M.D., [facility].C.P Pulmonary and Critical Care Medicine Staff Physician [place] [facility] Pager: [phone], If no answer or between  15:00h - 7:00h: call [phone]  01/13/2017 6:06 AM

## 2017-01-13 NOTE — Progress Notes (Signed)
PULMONARY / CRITICAL CARE MEDICINE   Name: Logan [name] MRN: [id number] DOB: Jul 27, 1925    ADMISSION DATE:  12/21/2016  CHIEF COMPLAINT:  Non-responsive  HISTORY OF PRESENT ILLNESS:   83yo male with very severe COPD with emphysema (2L home O2), Afib (on metoprolol and warfarin), HFpEF, Pulmonary HTN (WHO 3), who presented today BIBA after 1 day of SOB, DOE and increased respiratory distress.    In the ED he was somnolent and minimally responsive with acute resp acidosis, intubated with peri-intubation hypotension requiring pressors   Blood cx 3/25 >>strep pneumonia U strep ag pos  ETT 3/25 >> RUE PICC 3/26 >>  SUBJECTIVE:  Critically ill, intubated on neo gtt UO poor Awake   VITAL SIGNS: Temp:  [97 F (36.1 C)-100 F (37.8 C)] 100 F (37.8 C) (03/27 0746) Pulse Rate:  [83-138] 103 (03/27 0915) Resp:  [21-34] 24 (03/27 0915) BP: (81-127)/(49-95) 108/78 (03/27 0900) SpO2:  [77 %-99 %] 92 % (03/27 0915) Arterial Line BP: (80-133)/(45-71) 112/66 (03/27 0915) FiO2 (%):  [50 %-80 %] 50 % (03/27 0814) Weight:  [125 lb (56.7 kg)] 125 lb (56.7 kg) (03/27 0415) HEMODYNAMICS:   VENTILATOR SETTINGS: Vent Mode: PRVC FiO2 (%):  [50 %-80 %] 50 % Set Rate:  [24 bmp] 24 bmp Vt Set:  [490 mL] 490 mL PEEP:  [8 cmH20] 8 cmH20 Plateau Pressure:  [19 cmH20-24 cmH20] 20 cmH20 INTAKE / OUTPUT:  Intake/Output Summary (Last 24 hours) at 01/13/17 0951 Last data filed at 01/13/17 0900  Gross per 24 hour  Intake          2376.73 ml  Output              315 ml  Net          2061.73 ml    PHYSICAL EXAMINATION: General:  Elderly, intubated, + cachexia Neuro:  Awake, alert, follows commands HEENT:  ETT in place.  Pupils 45mm and reactive Cardiovascular:  Irreg rhythm, rate controlled, no m/r/g Lungs:  Diffuse, bilateral decreased air movement, faint scattered rhonchi Abdomen:  Soft, NT, ND, normal bowel sounds Musculoskeletal:  Bilateral atrophy and muscle wasting Skin:  Thin, b/l  LE 2+ pitting edema.  LABS:  CBC  Recent Labs Lab 01/08/2017 1846 01/12/2017 1902 01/12/17 0245 01/13/17 0405  WBC 17.6*  --  8.3 15.4*  HGB 15.0 17.0 13.5 13.7  HCT 47.3 50.0 43.7 42.5  PLT 231  --  192 196   Coag's  Recent Labs Lab 12/24/2016 1846 01/12/17 0830 01/13/17 0405  INR 2.38 3.61 4.51*   BMET  Recent Labs Lab 01/12/17 0245 01/12/17 0514 01/13/17 0405  NA 143 145 142  K 4.0 3.7 4.2  CL 103 105 105  CO2 30 30 29   BUN 25* 25* 48*  CREATININE 1.24 1.17 1.37*  GLUCOSE 152* 133* 182*   Electrolytes  Recent Labs Lab 01/12/17 0245 01/12/17 0514 01/12/17 1307 01/12/17 1726 01/13/17 0405  CALCIUM 8.4* 8.5*  --   --  8.8*  MG  --   --  2.0 2.1 2.3  PHOS  --  2.8 2.9 2.9 1.9*   Sepsis Markers  Recent Labs Lab 12/24/2016 1902 01/02/2017 2143 01/12/17 0023  LATICACIDVEN 5.05* 5.0* 4.1*   ABG  Recent Labs Lab 01/15/2017 2345 01/12/17 0103 01/12/17 0327  PHART 7.186* 7.210* 7.314*  PCO2ART 78.1* 81.3* 62.0*  PO2ART 77.0* 80.0* 70.0*   Liver Enzymes  Recent Labs Lab 12/24/2016 1846 01/12/17 0514  AST 39  --  ALT 30  --   ALKPHOS 65  --   BILITOT 0.9  --   ALBUMIN 3.4* 2.6*   Cardiac Enzymes  Recent Labs Lab 01/12/17 0514 01/12/17 0830 01/12/17 1726  TROPONINI 0.06* 0.07* 0.07*   Glucose  Recent Labs Lab 01/12/17 1123 01/12/17 1553 01/12/17 1947 01/12/17 2319 01/13/17 0312 01/13/17 0744  GLUCAP 128* 112* 166* 169* 163* 145*    Imaging Dg Chest Port 1 View  Result Date: 01/13/2017 CLINICAL DATA:  Acute respiratory failure, intubated patient. History of COPD, CHF, head and neck malignancy, former smoker. EXAM: PORTABLE CHEST 1 VIEW COMPARISON:  Portable chest x-ray of January 12, 2017 FINDINGS: The lungs remain hyperinflated. Confluent interstitial markings persist in the mid and lower lungs bilaterally. There small bilateral pleural effusions. There is no pneumothorax. The heart and pulmonary vascularity are normal. There is  calcification in the wall of the aortic arch. The endotracheal tube tip projects 5.5 cm above the carina. The right-sided PICC line tip projects over the midportion of the SVC. The esophagogastric tube tip and proximal port project below the GE junction. IMPRESSION: COPD. Stable bilateral mid and lower lung alveolar opacities most compatible with pneumonia. Stable small bilateral pleural effusions. The support tubes are in reasonable position. Thoracic aortic atherosclerosis. Electronically Signed   By: David  Martinique M.D.   On: 01/13/2017 07:02   Dg Chest Port 1 View  Result Date: 01/12/2017 CLINICAL DATA:  Right PICC line placement. EXAM: PORTABLE CHEST 1 VIEW COMPARISON:  01/07/2017 FINDINGS: The endotracheal to is stable, at the mid tracheal level. There is an NG tube coursing down the esophagus. The right PICC line tip is in the distal SVC. No complicating features. Persistent emphysematous changes, bilateral lower lobe airspace processes, left greater than right and right effusion. IMPRESSION: Right PICC line tip in the distal SVC.  No complicating features. Stable ET and NG tubes. Stable emphysematous changes and bilateral lower lobe airspace process. Electronically Signed   By: Marijo Sanes M.D.   On: 01/12/2017 11:57   Dg Abd Portable 1v  Result Date: 01/12/2017 CLINICAL DATA:  Nasogastric tube placement EXAM: PORTABLE ABDOMEN - 1 VIEW COMPARISON:  November 16, 2005 FINDINGS: Nasogastric tube tip and side port in stomach. No bowel dilatation or air-fluid level suggesting bowel obstruction. No free air. There is underlying emphysematous change in the bases with consolidation in the left base. IMPRESSION: Nasogastric tube tip and side port in stomach. Bowel gas pattern unremarkable. Consolidation left lung base. Emphysematous change noted in both lung bases. Electronically Signed   By: Lowella Grip III M.D.   On: 01/12/2017 11:55     ASSESSMENT / PLAN: 83 yo CM with very severe COPD admitted  with acute on chronic hypercapnic and hypoxemic respiratory failure 2/2 AECOPD 2/2 pneumococcal BLL CAP.  PULMONARY  A: Acute on chronic hypercapneic respiratory failure AECOPD Pneumococcal CAP  P:   - Ventilator settings reviewed & adjusted-drop PEEP 5, FIO2 50% - Duonebs Q6h scheduled - Solumedrol 60mg  IV Qday x5 days - Pulmicort nebulized BID - Holding brovanna given AFib  CARDIOVASCULAR  A:  Septic shock HFpEF - decompensated Pulmonary HTN (WHO 3) Afib, chronic - now rate controlled NSVT  P:  -Neo gtt - Holding metoprolol - Holding warfarin for now, follow INR daily, rising , heparin when < 2.0 - Holding digoxin- pharmacy to redose     RENAL A:   AKI (baseline 0.95) now Scr 1.44 - 2/2 shock P:   - NS @ 100 x  1L - Trend Scr   GASTROINTESTINAL A:   Protein calorie malnutrition P:   - Stress ulcer ppx -ct TFs  HEMATOLOGIC A:   Leukocytosis ? steroids  P:  follow  INFECTIOUS A:   -Septic shock - likely CAP  P:    Abx: CTX 3/26 >> Dc azithromycin,  ENDOCRINE A:    Hyperglycemia   P:   - SSI - Goal BG <180  NEUROLOGIC A:   Sedated P:   RASS goal: 0 - intermittent fentanyl  Code status: Full    FAMILY  - Updates:  wife at bedside 3/26  Total critical care time: 33 min  Kara Mead MD. FCCP. [place] [facility] Pager [phone] If no response call [phone]     01/13/2017, 9:51 AM

## 2017-01-13 NOTE — Progress Notes (Signed)
OET holder adjusted on pt face per MD order, pt OET was in proper position prior to adjustment, but done d/t MD order.  Pt tol well.

## 2017-01-13 NOTE — Progress Notes (Signed)
Pt suddenly became anxious on vent, flailing arms around, increased HR.  Pt placed back on full vent support. RN aware and at bedside.

## 2017-01-13 NOTE — Consult Note (Signed)
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  Clinical Assessment and Goals of Care: I met with Mr. Pettingill, his wife, his niece and her husband to discuss goals of care. He is intubated but very alert and appropriate-his mental status is remarkably sharp. Functional status prior to admission was ambulatory and independent. He is clearly emotionally and physically distress-this is his first intubation. He is able to communicate by writing.   I discussed his current condition, anticipated disease trajectory and stressed to both he and his wife that his condition is very fragile, but we would continue to hope for the best-we discussed a "what if" scenario and he clearly would not want to be re-intubated again.  Patient is making decisions and has capacity.   SUMMARY OF RECOMMENDATIONS  1. DNR, maximize medical treatment and continue intubation only short term- I do not think he will be able to tolerate continued intubation from a mental and emotional standpoint- he is very tearful, writing a desire to go home, writes that he wants to be like he was before. Ideally he will only need 24-48 more hours of intubation before he will be ready for tube to removed- I would not continue ETT unless clear evidence of improvement-the longer he is immobilized and in ICU the worse his outcome will be in general.   2. Provide  comfort measures in addition to full scope treatment- palliative will follow for support and to align treatment with his preferences. Wife was much more realistic after our discussion today.  Continue to provide support to the patient and wife during this very difficult time- will also ask Chaplains to see him.      Primary Diagnoses: Present on Admission: . COPD with acute exacerbation ([place])   I have reviewed the medical record, interviewed the patient and family, and examined the patient. The following aspects are pertinent.  Past Medical History:  Diagnosis Date  . Cancer ([place])    vocal cord - radiation   . CHF (congestive heart failure) (HCC)    DUE TO SYSTOLIC DYSFUNCTION  . Chronic anticoagulation   . Chronic atrial fibrillation ([place])   . COPD (chronic obstructive pulmonary disease) ([place])   . Dilated cardiomyopathy ([place])   . Hyperlipidemia   . Hypothyroidism   . PVC's (premature ventricular contractions)   . Renal calculi   . Shortness of breath    exertional   Social History   Social History  . Marital status: Married    Spouse name: N/A  . Number of children: 0  . Years  of education: N/A   Occupational History  . electrician-retired Retired   Social History Main Topics  . Smoking status: Former Smoker    Packs/day: 1.00    Years: 35.00    Types: Cigarettes    Quit date: 07/11/1961  . Smokeless tobacco: Never Used  . Alcohol use No  . Drug use: No  . Sexual activity: Not Asked   Other Topics Concern  . None   Social History Narrative  . None   Family History  Problem Relation Age of Onset  . Stroke Mother   . Seizures Mother   . Heart attack Father   . Heart failure Father   . Heart attack Brother    Scheduled Meds: . budesonide (PULMICORT) nebulizer solution  0.25 mg Nebulization BID  . cefTRIAXone (ROCEPHIN)  IV  2 g Intravenous QHS  . chlorhexidine gluconate (MEDLINE KIT)  15 mL Mouth Rinse BID  . Chlorhexidine Gluconate Cloth  6 each  Topical Daily  . digoxin  0.125 mg Per Tube Daily  . famotidine  20 mg Per Tube Daily  . ipratropium-albuterol  3 mL Nebulization Q6H  . levothyroxine  75 mcg Per Tube QAC breakfast  . mouth rinse  15 mL Mouth Rinse QID  . methylPREDNISolone (SOLU-MEDROL) injection  60 mg Intravenous Daily  . phosphorus  500 mg Oral BID  . sodium chloride flush  10-40 mL Intracatheter Q12H   Continuous Infusions: . feeding supplement (VITAL AF 1.2 CAL) 1,000 mL (01/13/17 0800)  . fentaNYL infusion INTRAVENOUS 40 mcg/hr (01/13/17 1300)  . phenylephrine (NEO-SYNEPHRINE) Adult infusion 40 mcg/min (01/13/17 1300)   PRN Meds:.sodium chloride, bisacodyl, docusate, fentaNYL, fentaNYL (SUBLIMAZE) injection, fentaNYL (SUBLIMAZE) injection, midazolam, midazolam, sennosides, sodium chloride flush Medications Prior to Admission:  Prior to Admission medications   Medication Sig Start Date End Date Taking? Authorizing Provider  ANORO ELLIPTA 62.5-25 MCG/INH AEPB INHALE 1 PUFF INTO THE LUNGS DAILY. 09/26/16  Yes Tanda Rockers, MD  digoxin (LANOXIN) 0.125 MG tablet TAKE 1 TABLET (0.125 MG TOTAL) BY MOUTH DAILY. 09/03/16  Yes Peter M Martinique, MD  furosemide (LASIX) 40 MG tablet Take 40 mg by mouth daily. May take 1 extra daily as needed for leg swelling 03/27/14  Yes Peter M Martinique, MD  levothyroxine (SYNTHROID, LEVOTHROID) 75 MCG tablet TAKE 1 TABLET (75 MCG TOTAL) BY MOUTH DAILY. 10/22/16  Yes Peter M Martinique, MD  losartan (COZAAR) 25 MG tablet Take 25 mg by mouth daily.  12/14/13  Yes Historical Provider, MD  tamsulosin (FLOMAX) 0.4 MG CAPS capsule Take 0.4 mg by mouth daily after breakfast.    Yes Historical Provider, MD  warfarin (COUMADIN) 3 MG tablet TAKE 1 TO 1.5 TABLETS DAILY AS DIRECTED BY COUMADIN CLINIC Patient taking differently: Take 3 mg by mouth daily on Sunday and Thursday. Take 4.5 mg by mouth daily on all other days. 01/09/17  Yes Peter M Martinique, MD  ANORO ELLIPTA 62.5-25 MCG/INH AEPB INHALE 1 PUFF INTO THE  LUNGS DAILY. Patient not taking: Reported on 01/12/2017 10/23/16   Tanda Rockers, MD  Calcium-Magnesium-Zinc (CAL-MAG-ZINC PO) Take 1 tablet by mouth every morning.     Historical Provider, MD  dextromethorphan-guaiFENesin (MUCINEX DM) 30-600 MG per 12 hr tablet Take 1-2 tablets by mouth every 12 (twelve) hours as needed (cough, congestion).    Historical Provider, MD  Fluticasone-Umeclidin-Vilant (TRELEGY ELLIPTA) 100-62.5-25 MCG/INH AEPB Inhale 1 puff into the lungs daily at 6 (six) AM. Patient not taking: Reported on 01/12/2017 09/24/16   Legrand Como  Mckinley Jewel, MD  Multiple Vitamin (MULTIVITAMIN) tablet Take 1 tablet by mouth daily.     Historical Provider, MD  OXYGEN Use 2L at bedtime and have with activity for shortness of breath    Historical Provider, MD  PROAIR HFA 108 (90 BASE) MCG/ACT inhaler Inhale 2 puffs into the lungs every 4 (four) hours as needed for wheezing or shortness of breath.  03/02/14   Historical Provider, MD   No Known Allergies Review of Systems  Physical Exam  Vital Signs: BP 107/63   Pulse (!) 108   Temp 98.9 F (37.2 C) (Oral)   Resp (!) 23   Ht _0  (1.753 m)   Wt 56.7 kg (125 lb)   SpO2 95%   BMI 18.46 kg/m  Pain Assessment: CPOT       SpO2: SpO2: 95 % O2 Device:SpO2: 95 % O2 Flow Rate: .   IO: Intake/output summary:  Intake/Output Summary (Last 24 hours) at 01/13/17 1353 Last data filed at 01/13/17 1300  Gross per 24 hour  Intake          2353.33 ml  Output              315 ml  Net          2038.33 ml    LBM: Last BM Date:  (PTA) Baseline Weight: Weight: 56.7 kg (125 lb) Most recent weight: Weight: 56.7 kg (125 lb)     Palliative Assessment/Data:   Flowsheet Rows     Most Recent Value  Intake Tab  Referral Department  Pulmonary  Unit at Time of Referral  ER  Palliative Care Primary Diagnosis  Pulmonary  Date Notified  01/03/2017  Palliative Care Type  New Palliative care  Reason for referral  Clarify Goals of Care  Date of Admission   01/03/2017  Date first seen by Palliative Care  01/13/17  # of days IP prior to Palliative referral  0  Clinical Assessment  Palliative Performance Scale Score  20%  Pain Max last 24 hours  Other (Comment)  Pain Min Last 24 hours  Other (Comment)  Dyspnea Max Last 24 Hours  Other (Comment)  Dyspnea Min Last 24 hours  Other (Comment)  Nausea Max Last 24 Hours  0  Nausea Min Last 24 Hours  0  Anxiety Max Last 24 Hours  Other (Comment)  Anxiety Min Last 24 Hours  Other (Comment)  Psychosocial & Spiritual Assessment  Palliative Care Outcomes      Time Total: 70 min Greater than 50%  of this time was spent counseling and coordinating care related to the above assessment and plan.  Signed by: Lane Hacker, DO   Please contact Palliative Medicine Team phone at [phone] for questions and concerns.  For individual provider: See Shea Evans

## 2017-01-13 NOTE — Progress Notes (Signed)
Per discussion w/ MD, okay to wean pt on vent.  Pt tol well so far, sat 91%.

## 2017-01-13 NOTE — Progress Notes (Signed)
CRITICAL VALUE ALERT  Critical value received: INR - 4.51  Date of notification:  01/13/2017  Time of notification:  9629  Critical value read back:Yes.    Nurse who received alert:  Ruben Gottron RN  MD notified (1st page):  Warren Lacy MD  Time of first page:  [phone]  MD notified (2nd page):  Time of second page:  Responding MD:  [phone]  Time MD responded:  Warren Lacy MD

## 2017-01-14 ENCOUNTER — Inpatient Hospital Stay (HOSPITAL_COMMUNITY): Payer: Medicare Other

## 2017-01-14 LAB — GLUCOSE, CAPILLARY
GLUCOSE-CAPILLARY: 161 mg/dL — AB (ref 65–99)
GLUCOSE-CAPILLARY: 170 mg/dL — AB (ref 65–99)
GLUCOSE-CAPILLARY: 202 mg/dL — AB (ref 65–99)
Glucose-Capillary: 155 mg/dL — ABNORMAL HIGH (ref 65–99)
Glucose-Capillary: 147 mg/dL — ABNORMAL HIGH (ref 65–99)
Glucose-Capillary: 183 mg/dL — ABNORMAL HIGH (ref 65–99)

## 2017-01-14 LAB — PHOSPHORUS: Phosphorus: 2.4 mg/dL — ABNORMAL LOW (ref 2.5–4.6)

## 2017-01-14 LAB — CBC
HEMATOCRIT: 38.4 % — AB (ref 39.0–52.0)
HEMOGLOBIN: 12.6 g/dL — AB (ref 13.0–17.0)
MCH: 32.6 pg (ref 26.0–34.0)
MCHC: 32.8 g/dL (ref 30.0–36.0)
MCV: 99.5 fL (ref 78.0–100.0)
Platelets: 153 10*3/uL (ref 150–400)
RBC: 3.86 MIL/uL — AB (ref 4.22–5.81)
RDW: 16.1 % — AB (ref 11.5–15.5)
WBC: 15.1 10*3/uL — ABNORMAL HIGH (ref 4.0–10.5)

## 2017-01-14 LAB — CULTURE, BLOOD (ROUTINE X 2)

## 2017-01-14 LAB — BASIC METABOLIC PANEL
Anion gap: 8 (ref 5–15)
BUN: 68 mg/dL — AB (ref 6–20)
CALCIUM: 8.7 mg/dL — AB (ref 8.9–10.3)
CHLORIDE: 106 mmol/L (ref 101–111)
CO2: 30 mmol/L (ref 22–32)
CREATININE: 1.3 mg/dL — AB (ref 0.61–1.24)
GFR calc Af Amer: 54 mL/min — ABNORMAL LOW (ref >60)
GFR calc non Af Amer: 47 mL/min — ABNORMAL LOW (ref >60)
Glucose, Bld: 165 mg/dL — ABNORMAL HIGH (ref 65–99)
Potassium: 4.7 mmol/L (ref 3.5–5.1)
SODIUM: 144 mmol/L (ref 135–145)

## 2017-01-14 LAB — PROTIME-INR
INR: 2.63
Prothrombin Time: 28.7 seconds — ABNORMAL HIGH (ref 11.4–15.2)

## 2017-01-14 LAB — MAGNESIUM: Magnesium: 2.3 mg/dL (ref 1.7–2.4)

## 2017-01-14 MED ORDER — METOPROLOL TARTRATE 5 MG/5ML IV SOLN
2.5000 mg | INTRAVENOUS | Status: DC | PRN
  Administered 2017-01-14: 2.5 mg via INTRAVENOUS
  Administered 2017-01-15 – 2017-01-17 (×4): 5 mg via INTRAVENOUS
  Filled 2017-01-14 (×7): qty 5

## 2017-01-14 MED ORDER — BISACODYL 5 MG PO TBEC
5.0000 mg | DELAYED_RELEASE_TABLET | Freq: Once | ORAL | Status: AC
  Administered 2017-01-14: 5 mg via ORAL
  Filled 2017-01-14: qty 1

## 2017-01-14 MED ORDER — METHYLPREDNISOLONE SODIUM SUCC 40 MG IJ SOLR
40.0000 mg | Freq: Every day | INTRAMUSCULAR | Status: AC
  Administered 2017-01-15: 40 mg via INTRAVENOUS
  Filled 2017-01-14: qty 1

## 2017-01-14 NOTE — Progress Notes (Signed)
PULMONARY / CRITICAL CARE MEDICINE   Name: Logan [name] MRN: [id number] DOB: [birth year]    ADMISSION DATE:  01/16/2017  HISTORY OF PRESENT ILLNESS:   83yo male with very severe COPD with emphysema (2L home O2), Afib (on metoprolol and warfarin), HFpEF, Pulmonary HTN (WHO 3), who presented 3/25  BIBA after 1 day of SOB, DOE and increased respiratory distress.    In the ED he was somnolent and minimally responsive with acute resp acidosis, intubated with peri-intubation hypotension requiring pressors   Blood cx 3/25 >>strep pneumonia U strep ag pos  ETT 3/25 >> RUE PICC 3/26 >>  SUBJECTIVE:  Critically ill, intubated off neo gtt tachy UO picking up    VITAL SIGNS: Temp:  [97.8 F (36.6 C)-99.6 F (37.6 C)] 99.4 F (37.4 C) (03/28 0736) Pulse Rate:  [81-129] 124 (03/28 0945) Resp:  [19-30] 30 (03/28 0945) BP: (92-135)/(54-103) 127/74 (03/28 0900) SpO2:  [83 %-96 %] 93 % (03/28 0945) Arterial Line BP: (99-139)/(48-88) 138/57 (03/28 0945) FiO2 (%):  [40 %-50 %] 50 % (03/28 0750) Weight:  [125 lb (56.7 kg)] 125 lb (56.7 kg) (03/28 0358) HEMODYNAMICS:   VENTILATOR SETTINGS: Vent Mode: PRVC FiO2 (%):  [40 %-50 %] 50 % Set Rate:  [24 bmp] 24 bmp Vt Set:  [490 mL] 490 mL PEEP:  [5 cmH20] 5 cmH20 Pressure Support:  [10 cmH20] 10 cmH20 Plateau Pressure:  [16 cmH20-18 cmH20] 16 cmH20 INTAKE / OUTPUT:  Intake/Output Summary (Last 24 hours) at 01/14/17 1018 Last data filed at 01/14/17 0900  Gross per 24 hour  Intake           1608.1 ml  Output              955 ml  Net            653.1 ml    PHYSICAL EXAMINATION: General:  Elderly, intubated, + cachexia Neuro:  Awake, alert, follows commands HEENT:  ETT in place.  Pupils 39mm and reactive Cardiovascular:  Irreg rhythm, rate controlled, no m/r/g Lungs:  Diffuse, bilateral decreased air movement, faint scattered rhonchi Abdomen:  Soft, NT, ND, normal bowel sounds Musculoskeletal:  Bilateral atrophy and muscle  wasting Skin:  Thin, b/l LE 2+ pitting edema.  LABS:  CBC  Recent Labs Lab 01/12/17 0245 01/13/17 0405 01/14/17 0355  WBC 8.3 15.4* 15.1*  HGB 13.5 13.7 12.6*  HCT 43.7 42.5 38.4*  PLT 192 196 153   Coag's  Recent Labs Lab 01/12/17 0830 01/13/17 0405 01/14/17 0355  INR 3.61 4.51* 2.63   BMET  Recent Labs Lab 01/12/17 0514 01/13/17 0405 01/14/17 0355  NA 145 142 144  K 3.7 4.2 4.7  CL 105 105 106  CO2 30 29 30   BUN 25* 48* 68*  CREATININE 1.17 1.37* 1.30*  GLUCOSE 133* 182* 165*   Electrolytes  Recent Labs Lab 01/12/17 0514  01/12/17 1726 01/13/17 0405 01/14/17 0355  CALCIUM 8.5*  --   --  8.8* 8.7*  MG  --   < > 2.1 2.3 2.3  PHOS 2.8  < > 2.9 1.9* 2.4*  < > = values in this interval not displayed. Sepsis Markers  Recent Labs Lab 12/24/2016 1902 12/28/2016 2143 01/12/17 0023  LATICACIDVEN 5.05* 5.0* 4.1*   ABG  Recent Labs Lab 12/24/2016 2345 01/12/17 0103 01/12/17 0327  PHART 7.186* 7.210* 7.314*  PCO2ART 78.1* 81.3* 62.0*  PO2ART 77.0* 80.0* 70.0*   Liver Enzymes  Recent Labs Lab 01/07/2017 1846 01/12/17 0514  AST  39  --   ALT 30  --   ALKPHOS 65  --   BILITOT 0.9  --   ALBUMIN 3.4* 2.6*   Cardiac Enzymes  Recent Labs Lab 01/12/17 0514 01/12/17 0830 01/12/17 1726  TROPONINI 0.06* 0.07* 0.07*   Glucose  Recent Labs Lab 01/13/17 1150 01/13/17 1543 01/13/17 2020 01/14/17 0012 01/14/17 0417 01/14/17 0733  GLUCAP 158* 164* 166* 170* 155* 147*    Imaging Dg Chest Port 1 View  Result Date: 01/14/2017 CLINICAL DATA:  Acute respiratory failure axial or just at 3 of January 13, 2017 EXAM: PORTABLE CHEST 1 VIEW COMPARISON:  Portable chest x-ray of January 13, 2017. FINDINGS: The lungs remain hyperinflated. Confluent interstitial and alveolar opacities persist in the mid and lower lung zones with op duration of the hemidiaphragms. There small bilateral pleural effusions. The heart is normal in size. The pulmonary vascularity is  not engorged. There is calcification in the wall of the aortic arch. The endotracheal tube tip lies 6 cm above the carina. The esophagogastric tube tip in proximal port project below the inferior margin of the image. The right-sided PICC line tip projects over the midportion of the SVC. IMPRESSION: Stable appearance of the chest consistent with bilateral pneumonia and small bilateral pleural effusions superimposed upon COPD. The support tubes are in reasonable position. Thoracic aortic atherosclerosis. Electronically Signed   By: David  Martinique M.D.   On: 01/14/2017 07:13     ASSESSMENT / PLAN: 83 yo CM with very severe COPD admitted with acute on chronic hypercapnic and hypoxemic respiratory failure 2/2 AECOPD 2/2 pneumococcal BLL CAP.  PULMONARY  A: Acute on chronic hypercapneic respiratory failure AECOPD Pneumococcal CAP  P:   - Ventilator settings reviewed & adjusted-down to 50%/+5 -start SBTs - Duonebs Q6h scheduled - Solumedrol 60mg  IV Qday x5 days - Pulmicort nebulized BID - Holding brovanna given AFib  CARDIOVASCULAR  A:  Septic shock HFpEF - decompensated Pulmonary HTN (WHO 3) Afib, chronic - now rate controlled NSVT  P:  -Off pressors - metoprolol q3h prn for HR >120 - Holding warfarin for now, follow INR -decreasing now, heparin when < 2.0 - ct digoxin- pharmacy  redosing     RENAL A:   AKI (baseline 0.95) now Scr 1.44 - 2/2 shock P:   - hopeful for recovery - Trend Scr & lytes   GASTROINTESTINAL A:   Protein calorie malnutrition P:   - Stress ulcer ppx -ct TFs  HEMATOLOGIC A:   Leukocytosis ? steroids  P:  follow  INFECTIOUS A:   -Septic shock - likely CAP  P:    Abx: CTX 3/26 >> Will need 14 ds   ENDOCRINE A:    Hyperglycemia   P:   - SSI - Goal BG <180  NEUROLOGIC A:   Sedated P:   RASS goal: 0 - intermittent fentanyl  Code status: DNR    FAMILY  - Updates:  wife at bedside 3/26 Palliative discussion 3/27 , one  way extubation, hopeful we can get there in 1-2 days based on progress with weaning  Total critical care time: 36 min  Kara Mead MD. FCCP. [facility] Pager [phone] If no response call [phone]     01/14/2017, 10:18 AM

## 2017-01-14 NOTE — Progress Notes (Addendum)
Subglottic Suction turned off at this time due to aggravating pt and no sputum being obtained.  RN aware. RT will turn on intermittently.

## 2017-01-15 ENCOUNTER — Inpatient Hospital Stay (HOSPITAL_COMMUNITY): Payer: Medicare Other

## 2017-01-15 LAB — PROTIME-INR
INR: 1.69
PROTHROMBIN TIME: 20.1 s — AB (ref 11.4–15.2)

## 2017-01-15 LAB — BLOOD GAS, ARTERIAL
Acid-Base Excess: 8.4 mmol/L — ABNORMAL HIGH (ref 0.0–2.0)
BICARBONATE: 33.4 mmol/L — AB (ref 20.0–28.0)
DRAWN BY: 441371
FIO2: 40
Mode: POSITIVE
O2 Saturation: 92.2 %
PEEP: 5 cmH2O
Patient temperature: 97.8
Pressure support: 5 cmH2O
pCO2 arterial: 55.3 mmHg — ABNORMAL HIGH (ref 32.0–48.0)
pH, Arterial: 7.396 (ref 7.350–7.450)
pO2, Arterial: 68.9 mmHg — ABNORMAL LOW (ref 83.0–108.0)

## 2017-01-15 LAB — BASIC METABOLIC PANEL
Anion gap: 6 (ref 5–15)
BUN: 60 mg/dL — AB (ref 6–20)
CALCIUM: 8.6 mg/dL — AB (ref 8.9–10.3)
CO2: 34 mmol/L — ABNORMAL HIGH (ref 22–32)
Chloride: 107 mmol/L (ref 101–111)
Creatinine, Ser: 0.97 mg/dL (ref 0.61–1.24)
GFR calc Af Amer: >60 mL/min (ref >60)
GLUCOSE: 180 mg/dL — AB (ref 65–99)
Potassium: 4.7 mmol/L (ref 3.5–5.1)
SODIUM: 147 mmol/L — AB (ref 135–145)

## 2017-01-15 LAB — GLUCOSE, CAPILLARY
GLUCOSE-CAPILLARY: 157 mg/dL — AB (ref 65–99)
GLUCOSE-CAPILLARY: 164 mg/dL — AB (ref 65–99)
GLUCOSE-CAPILLARY: 143 mg/dL — AB (ref 65–99)
GLUCOSE-CAPILLARY: 133 mg/dL — AB (ref 65–99)
Glucose-Capillary: 114 mg/dL — ABNORMAL HIGH (ref 65–99)
Glucose-Capillary: 133 mg/dL — ABNORMAL HIGH (ref 65–99)
Glucose-Capillary: 160 mg/dL — ABNORMAL HIGH (ref 65–99)

## 2017-01-15 LAB — CBC
HCT: 38.3 % — ABNORMAL LOW (ref 39.0–52.0)
Hemoglobin: 12.4 g/dL — ABNORMAL LOW (ref 13.0–17.0)
MCH: 32.2 pg (ref 26.0–34.0)
MCHC: 32.4 g/dL (ref 30.0–36.0)
MCV: 99.5 fL (ref 78.0–100.0)
PLATELETS: 125 10*3/uL — AB (ref 150–400)
RBC: 3.85 MIL/uL — ABNORMAL LOW (ref 4.22–5.81)
RDW: 15.9 % — AB (ref 11.5–15.5)
WBC: 13.1 10*3/uL — AB (ref 4.0–10.5)

## 2017-01-15 LAB — MAGNESIUM: Magnesium: 2.5 mg/dL — ABNORMAL HIGH (ref 1.7–2.4)

## 2017-01-15 LAB — HEPARIN LEVEL (UNFRACTIONATED): HEPARIN UNFRACTIONATED: 0.1 [IU]/mL — AB (ref 0.30–0.70)

## 2017-01-15 LAB — PHOSPHORUS: Phosphorus: 2 mg/dL — ABNORMAL LOW (ref 2.5–4.6)

## 2017-01-15 MED ORDER — ORAL CARE MOUTH RINSE: 15.0000 mL | Freq: Two times a day (BID) | OROMUCOSAL | Status: DC

## 2017-01-15 MED ORDER — CEFTRIAXONE SODIUM 2 G IJ SOLR
2.0000 g | Freq: Every day | INTRAMUSCULAR | Status: DC
  Administered 2017-01-15 – 2017-01-16 (×2): 2 g via INTRAVENOUS
  Filled 2017-01-15 (×2): qty 2

## 2017-01-15 MED ORDER — PHENOL 1.4 % MT LIQD
1.0000 | OROMUCOSAL | Status: DC | PRN
  Administered 2017-01-15 – 2017-01-16 (×2): 1 via OROMUCOSAL
  Filled 2017-01-15: qty 177

## 2017-01-15 MED ORDER — HEPARIN BOLUS VIA INFUSION
3000.0000 [IU] | Freq: Once | INTRAVENOUS | Status: AC
  Administered 2017-01-15: 3000 [IU] via INTRAVENOUS
  Filled 2017-01-15: qty 3000

## 2017-01-15 MED ORDER — MIRTAZAPINE 7.5 MG PO TABS
7.5000 mg | ORAL_TABLET | Freq: Every day | ORAL | Status: DC
  Administered 2017-01-15: 7.5 mg via ORAL
  Filled 2017-01-15 (×2): qty 1

## 2017-01-15 MED ORDER — HEPARIN (PORCINE) IN NACL 100-0.45 UNIT/ML-% IJ SOLN
1400.0000 [IU]/h | INTRAMUSCULAR | Status: DC
  Administered 2017-01-15: 900 [IU]/h via INTRAVENOUS
  Administered 2017-01-16: 1300 [IU]/h via INTRAVENOUS
  Administered 2017-01-17: 1450 [IU]/h via INTRAVENOUS
  Filled 2017-01-15 (×6): qty 250

## 2017-01-15 MED ORDER — HEPARIN BOLUS VIA INFUSION
1800.0000 [IU] | Freq: Once | INTRAVENOUS | Status: AC
  Administered 2017-01-15: 1800 [IU] via INTRAVENOUS
  Filled 2017-01-15: qty 1800

## 2017-01-15 NOTE — Progress Notes (Signed)
eLink Physician-Brief Progress Note Patient Name: CAS TRACZ DOB: 1925-04-03 MRN: [id number]   Date of Service  01/15/2017  HPI/Events of Note  Patient extubated today and c/o sore throat.   eICU Interventions  Will order: 1. Chloroseptic Spray PRN.      Intervention Category Intermediate Interventions: Pain - evaluation and management  Aleyza Salmi Eugene 01/15/2017, 8:06 PM

## 2017-01-15 NOTE — Progress Notes (Signed)
approx 225 ml of fentanyl wasted in sink. Sebastian Ache RN witnessed

## 2017-01-15 NOTE — Progress Notes (Signed)
Palliative Medicine RN Note: Pt is off vent. He is tearful, stating he can't spell anymore and that at 83 years old this is distressing to him. Per RN he refused ice chips d/t fear of aspiration. Discussed w Dr Hilma Favors, who started Remeron and will see him tonight or tomorrow.  Marjie Skiff Avriel Kandel, RN, BSN, [facility] 01/15/2017 1:57 PM Cell [phone] 8:00-4:00 Monday-Friday Office [phone]

## 2017-01-15 NOTE — Progress Notes (Signed)
ANTICOAGULATION CONSULT NOTE - Initial Consult  Pharmacy Consult for Heparin Indication: atrial fibrillation  No Known Allergies  Patient Measurements: Height: '5\' 9"'$  (175.3 cm) Weight: 133 lb 9.6 oz (60.6 kg) IBW/kg (Calculated) : 70.7 Heparin Dosing Weight: 60.6 kg  Vital Signs: Temp: 97.8 F (36.6 C) (03/29 0747) Temp Source: Oral (03/29 0747) BP: 110/71 (03/29 0900) Pulse Rate: 113 (03/29 0900)  Labs:  Recent Labs  01/12/17 1726  01/13/17 0405 01/14/17 0355 01/15/17 0256  HGB  --   < > 13.7 12.6* 12.4*  HCT  --   --  42.5 38.4* 38.3*  PLT  --   --  196 153 125*  LABPROT  --   --  44.1* 28.7* 20.1*  INR  --   --  4.51* 2.63 1.69  CREATININE  --   --  1.37* 1.30* 0.97  TROPONINI 0.07*  --   --   --   --   < > = values in this interval not displayed.  Estimated Creatinine Clearance: 43.4 mL/min (by C-G formula based on SCr of 0.97 mg/dL).   Medical History: Past Medical History:  Diagnosis Date  . 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 CHF (congestive heart failure) ([facility])    DUE TO SYSTOLIC DYSFUNCTION  . Chronic anticoagulation   . Chronic atrial fibrillation ([place])   . COPD (chronic obstructive pulmonary disease) ([place])   . Dilated cardiomyopathy ([place])   . Hyperlipidemia   . Hypothyroidism   . PVC's (premature ventricular contractions)   . Renal calculi   . Shortness of breath    exertional    Medications:  Prescriptions Prior to Admission  Medication Sig Dispense Refill Last Dose  . ANORO ELLIPTA 62.5-25 MCG/INH AEPB INHALE 1 PUFF INTO THE LUNGS DAILY. 60 each 8 Unknown at Unknown  . digoxin (LANOXIN) 0.125 MG tablet TAKE 1 TABLET (0.125 MG TOTAL) BY MOUTH DAILY. 30 tablet 5 Unknown at Unknown  . furosemide (LASIX) 40 MG tablet Take 40 mg by mouth daily. May take 1 extra daily as needed for leg swelling   Unknown at Unknown  . levothyroxine (SYNTHROID, LEVOTHROID) 75 MCG tablet TAKE 1 TABLET (75 MCG TOTAL) BY MOUTH DAILY. 30 tablet 2 Unknown at  Unknown  . losartan (COZAAR) 25 MG tablet Take 25 mg by mouth daily.    Unknown at Unknown  . tamsulosin (FLOMAX) 0.4 MG CAPS capsule Take 0.4 mg by mouth daily after breakfast.    Unknown at Unknown  . warfarin (COUMADIN) 3 MG tablet TAKE 1 TO 1.5 TABLETS DAILY AS DIRECTED BY COUMADIN CLINIC (Patient taking differently: Take 3 mg by mouth daily on Sunday and Thursday. Take 4.5 mg by mouth daily on all other days.) 135 tablet 1 Unknown at Unknown  . ANORO ELLIPTA 62.5-25 MCG/INH AEPB INHALE 1 PUFF INTO THE LUNGS DAILY. (Patient not taking: Reported on 01/12/2017) 60 each 11 Not Taking at Unknown time  . Calcium-Magnesium-Zinc (CAL-MAG-ZINC PO) Take 1 tablet by mouth every morning.    Unknown at Unknown  . dextromethorphan-guaiFENesin (MUCINEX DM) 30-600 MG per 12 hr tablet Take 1-2 tablets by mouth every 12 (twelve) hours as needed (cough, congestion).   Unknown at Unknown  . Fluticasone-Umeclidin-Vilant (TRELEGY ELLIPTA) 100-62.5-25 MCG/INH AEPB Inhale 1 puff into the lungs daily at 6 (six) AM. (Patient not taking: Reported on 01/12/2017) 60 each 5 Not Taking at Unknown time  . Multiple Vitamin (MULTIVITAMIN) tablet Take 1 tablet by mouth daily.  Unknown at Unknown  . OXYGEN Use 2L at bedtime and have with activity for shortness of breath   Unknown at Unknown  . PROAIR HFA 108 (90 BASE) MCG/ACT inhaler Inhale 2 puffs into the lungs every 4 (four) hours as needed for wheezing or shortness of breath.    Unknown at Unknown   Scheduled:  . budesonide (PULMICORT) nebulizer solution  0.25 mg Nebulization BID  . cefTRIAXone (ROCEPHIN)  IV  2 g Intravenous QHS  . chlorhexidine gluconate (MEDLINE KIT)  15 mL Mouth Rinse BID  . Chlorhexidine Gluconate Cloth  6 each Topical Daily  . digoxin  0.125 mg Per Tube Daily  . famotidine  20 mg Per Tube Daily  . ipratropium-albuterol  3 mL Nebulization Q6H  . levothyroxine  75 mcg Per Tube QAC breakfast  . mouth rinse  15 mL Mouth Rinse QID  . sodium chloride  flush  10-40 mL Intracatheter Q12H   Infusions:  . feeding supplement (VITAL AF 1.2 CAL) 1,000 mL (01/15/17 0914)  . fentaNYL infusion INTRAVENOUS 50 mcg/hr (01/15/17 5003)    Assessment: 83yo male with history of Afib on warfarin PTA. Pharmacy is consulted to dose heparin for atrial fibrillation while holding warfarin. INR is now subtherapeutic.  Goal of Therapy:  Heparin level 0.3-0.7 units/ml Monitor platelets by anticoagulation protocol: Yes   Plan:  Give 3000 units bolus x 1 Start heparin infusion at 900 units/hr Check anti-Xa level in 8 hours and daily while on heparin Continue to monitor H&H and platelets  Andrey Cota. Diona Foley, PharmD, BCPS Clinical Pharmacist [phone] 01/15/2017,10:13 AM

## 2017-01-15 NOTE — Procedures (Signed)
Extubation Procedure Note  Patient Details:   Name: Logan [name] DOB: [birth year] MRN: [id number]   Airway Documentation:     Evaluation  O2 sats: stable throughout Complications: No apparent complications Patient did tolerate procedure well. Bilateral Breath Sounds: Clear, Diminished   Yes   Positive cuff leak noted, Place on HFNC 8L Sat goal 88% and above, Pt able to get 500 ml using IS, no stridor noted.   Mingo Amber Yoshiye Kraft 01/15/2017, 1:31 PM

## 2017-01-15 NOTE — Progress Notes (Signed)
PULMONARY / CRITICAL CARE MEDICINE   Name: Logan [name] MRN: [id number] DOB: [birth year]    ADMISSION DATE:  01/13/2017  HISTORY OF PRESENT ILLNESS:   83yo male with very severe COPD with emphysema (2L home O2), Afib (on metoprolol and warfarin), HFpEF, Pulmonary HTN (WHO 3), who presented 3/25  BIBA after 1 day of SOB, DOE and increased respiratory distress.    In the ED he was somnolent and minimally responsive with acute resp acidosis, intubated with peri-intubation hypotension requiring pressors   Blood cx 3/25 >>strep pneumonia U strep ag pos  ETT 3/25 >> RUE PICC 3/26 >>  SUBJECTIVE:  Critically ill, intubated off pressors tachy UO improved Afebrile, denies pain    VITAL SIGNS: Temp:  [97.3 F (36.3 C)-99.2 F (37.3 C)] 97.8 F (36.6 C) (03/29 0747) Pulse Rate:  [60-132] 113 (03/29 0900) Resp:  [21-34] 22 (03/29 0900) BP: (91-133)/(61-113) 110/71 (03/29 0900) SpO2:  [90 %-98 %] 95 % (03/29 0900) Arterial Line BP: (113-145)/(69-78) 128/69 (03/28 1300) FiO2 (%):  [40 %-50 %] 40 % (03/29 0802) Weight:  [133 lb 9.6 oz (60.6 kg)] 133 lb 9.6 oz (60.6 kg) (03/29 0255) HEMODYNAMICS:   VENTILATOR SETTINGS: Vent Mode: PSV;CPAP FiO2 (%):  [40 %-50 %] 40 % Set Rate:  [24 bmp] 24 bmp Vt Set:  [490 mL] 490 mL PEEP:  [5 cmH20] 5 cmH20 Pressure Support:  [5 cmH20-10 cmH20] 5 cmH20 Plateau Pressure:  [16 cmH20-19 cmH20] 19 cmH20 INTAKE / OUTPUT:  Intake/Output Summary (Last 24 hours) at 01/15/17 1013 Last data filed at 01/15/17 0900  Gross per 24 hour  Intake             1460 ml  Output             1610 ml  Net             -150 ml    PHYSICAL EXAMINATION: General:  Elderly, intubated Neuro:  Awake, alert, follows commands HEENT:  ETT in place.  Pupils 39mm and reactive Cardiovascular:  Irreg rhythm, rate controlled, no m/r/g Lungs:  Diffuse, bilateral decreased air movement, no rhonchi Abdomen:  Soft, NT, ND, normal bowel sounds Musculoskeletal:  Bilateral  atrophy and muscle wasting Skin:  Thin, b/l LE 2+ pitting edema.  LABS:  CBC  Recent Labs Lab 01/13/17 0405 01/14/17 0355 01/15/17 0256  WBC 15.4* 15.1* 13.1*  HGB 13.7 12.6* 12.4*  HCT 42.5 38.4* 38.3*  PLT 196 153 125*   Coag's  Recent Labs Lab 01/13/17 0405 01/14/17 0355 01/15/17 0256  INR 4.51* 2.63 1.69   BMET  Recent Labs Lab 01/13/17 0405 01/14/17 0355 01/15/17 0256  NA 142 144 147*  K 4.2 4.7 4.7  CL 105 106 107  CO2 29 30 34*  BUN 48* 68* 60*  CREATININE 1.37* 1.30* 0.97  GLUCOSE 182* 165* 180*   Electrolytes  Recent Labs Lab 01/13/17 0405 01/14/17 0355 01/15/17 0256  CALCIUM 8.8* 8.7* 8.6*  MG 2.3 2.3 2.5*  PHOS 1.9* 2.4* 2.0*   Sepsis Markers  Recent Labs Lab 12/29/2016 1902 12/28/2016 2143 01/12/17 0023  LATICACIDVEN 5.05* 5.0* 4.1*   ABG  Recent Labs Lab 01/12/17 0103 01/12/17 0327 01/15/17 0930  PHART 7.210* 7.314* 7.396  PCO2ART 81.3* 62.0* 55.3*  PO2ART 80.0* 70.0* 68.9*   Liver Enzymes  Recent Labs Lab 12/31/2016 1846 01/12/17 0514  AST 39  --   ALT 30  --   ALKPHOS 65  --   BILITOT 0.9  --  ALBUMIN 3.4* 2.6*   Cardiac Enzymes  Recent Labs Lab 01/12/17 0514 01/12/17 0830 01/12/17 1726  TROPONINI 0.06* 0.07* 0.07*   Glucose  Recent Labs Lab 01/14/17 1144 01/14/17 1522 01/14/17 1930 01/14/17 2354 01/15/17 0336 01/15/17 0743  GLUCAP 161* 202* 183* 160* 157* 164*    Imaging Dg Chest Port 1 View  Result Date: 01/15/2017 CLINICAL DATA:  Acute respiratory failure EXAM: PORTABLE CHEST 1 VIEW COMPARISON:  01/14/2017 FINDINGS: Cardiac shadow is stable. Endotracheal tube and nasogastric catheter is well as a right-sided PICC line are again seen and stable. Patchy infiltrates and effusions are again identified bilaterally. Given some variation in the imaging technique the overall appearance is stable. IMPRESSION: Stable bilateral infiltrates and effusions. Electronically Signed   By: Inez Catalina M.D.   On:  01/15/2017 07:33     ASSESSMENT / PLAN: 83 yo CM with very severe COPD admitted with acute on chronic hypercapnic and hypoxemic respiratory failure 2/2 AECOPD 2/2 pneumococcal BLL CAP.  PULMONARY  A: Acute on chronic hypercapneic respiratory failure AECOPD Pneumococcal CAP  P:   - Ventilator settings reviewed & adjusted-weaning well on PS 5/5, good Tv -proceed with extubation , NO REINTUBATION - Duonebs Q6h scheduled - Solumedrol 60mg  IV Qday x5 days - Pulmicort nebulized BID - Holding brovana given AFib  CARDIOVASCULAR  A:  Septic shock HFpEF - decompensated Pulmonary HTN (WHO 3) Afib, chronic - now rate controlled NSVT  P:  -Off pressors - metoprolol q3h prn for HR >120 - Holding warfarin for now, start heparin now that INR < 2.0 - ct digoxin- pharmacy  dosing    RENAL A:   AKI (baseline 0.95) now Scr 1.44 - 2/2 shock P:   - resolved - Trend Scr & lytes   GASTROINTESTINAL A:   Protein calorie malnutrition P:   - Stress ulcer ppx -hold TFs for extuation  HEMATOLOGIC A:   Leukocytosis ? steroids  P:  follow  INFECTIOUS A:   -Septic shock - likely CAP  P:    Abx: CTX 3/26 >> Will need 14 ds   ENDOCRINE A:    Hyperglycemia   P:   - SSI - Goal BG <180  NEUROLOGIC A:   Sedated P:   RASS goal: 0 - intermittent fentanyl  Code status: DNR, no reintubation clarified    FAMILY  - Updates:  wife at bedside 3/26 Palliative discussion 3/27 , one way extubation  Total critical care time: 70 min  Kara Mead MD. [facility]. [place] [facility] Pager [phone] If no response call [phone]     01/15/2017, 10:13 AM

## 2017-01-15 NOTE — Progress Notes (Addendum)
ANTICOAGULATION CONSULT NOTE  Pharmacy Consult for Heparin Indication: atrial fibrillation  No Known Allergies  Patient Measurements: Height: 5\' 9"  (175.3 cm) Weight: 133 lb 9.6 oz (60.6 kg) IBW/kg (Calculated) : 70.7 Heparin Dosing Weight: 60.6 kg  Vital Signs: Temp: 98.2 F (36.8 C) (03/29 1141) Temp Source: Oral (03/29 1141) BP: 127/87 (03/29 1800) Pulse Rate: 122 (03/29 1800)  Labs:  Recent Labs  01/13/17 0405 01/14/17 0355 01/15/17 0256 01/15/17 1816  HGB 13.7 12.6* 12.4*  --   HCT 42.5 38.4* 38.3*  --   PLT 196 153 125*  --   LABPROT 44.1* 28.7* 20.1*  --   INR 4.51* 2.63 1.69  --   HEPARINUNFRC  --   --   --  0.10*  CREATININE 1.37* 1.30* 0.97  --     Estimated Creatinine Clearance: 43.4 mL/min (by C-G formula based on SCr of 0.97 mg/dL).    Medications:  Infusions:  . heparin 900 Units/hr (01/15/17 1600)    Assessment: 83 y/o male with history of Afib on warfarin PTA. Pharmacy is consulted to dose heparin for atrial fibrillation while holding warfarin.   Heparin level is subtherapeutic at 0.1. No bleeding noted. No problems per RN.  Goal of Therapy:  Heparin level 0.3-0.7 units/ml Monitor platelets by anticoagulation protocol: Yes   Plan:  - Heparin 1800 units IV bolus then increase rate to 1100 units/hr - Next heparin level with am labs - Daily heparin level and CBC - Monitor for s/sx of bleeding   Renold Genta, PharmD, BCPS Clinical Pharmacist Phone for today - [place] - [phone] 01/15/2017 6:58 PM

## 2017-01-16 ENCOUNTER — Inpatient Hospital Stay (HOSPITAL_COMMUNITY): Payer: Medicare Other

## 2017-01-16 LAB — BASIC METABOLIC PANEL
ANION GAP: 8 (ref 5–15)
BUN: 52 mg/dL — ABNORMAL HIGH (ref 6–20)
CHLORIDE: 108 mmol/L (ref 101–111)
CO2: 35 mmol/L — AB (ref 22–32)
Calcium: 8.8 mg/dL — ABNORMAL LOW (ref 8.9–10.3)
Creatinine, Ser: 0.87 mg/dL (ref 0.61–1.24)
GFR calc non Af Amer: >60 mL/min (ref >60)
Glucose, Bld: 129 mg/dL — ABNORMAL HIGH (ref 65–99)
POTASSIUM: 5.2 mmol/L — AB (ref 3.5–5.1)
SODIUM: 151 mmol/L — AB (ref 135–145)

## 2017-01-16 LAB — GLUCOSE, CAPILLARY
GLUCOSE-CAPILLARY: 127 mg/dL — AB (ref 65–99)
Glucose-Capillary: 116 mg/dL — ABNORMAL HIGH (ref 65–99)
Glucose-Capillary: 127 mg/dL — ABNORMAL HIGH (ref 65–99)

## 2017-01-16 LAB — HEPARIN LEVEL (UNFRACTIONATED)
Heparin Unfractionated: 0.15 IU/mL — ABNORMAL LOW (ref 0.30–0.70)
Heparin Unfractionated: 0.25 IU/mL — ABNORMAL LOW (ref 0.30–0.70)
Heparin Unfractionated: 0.61 IU/mL (ref 0.30–0.70)

## 2017-01-16 LAB — CBC
HCT: 38.8 % — ABNORMAL LOW (ref 39.0–52.0)
HEMOGLOBIN: 12.4 g/dL — AB (ref 13.0–17.0)
MCH: 32.2 pg (ref 26.0–34.0)
MCHC: 32 g/dL (ref 30.0–36.0)
MCV: 100.8 fL — AB (ref 78.0–100.0)
PLATELETS: 132 10*3/uL — AB (ref 150–400)
RBC: 3.85 MIL/uL — AB (ref 4.22–5.81)
RDW: 16 % — ABNORMAL HIGH (ref 11.5–15.5)
WBC: 11.4 10*3/uL — AB (ref 4.0–10.5)

## 2017-01-16 LAB — BLOOD GAS, ARTERIAL
Acid-Base Excess: 8.4 mmol/L — ABNORMAL HIGH (ref 0.0–2.0)
BICARBONATE: 33.7 mmol/L — AB (ref 20.0–28.0)
Drawn by: 441371
FIO2: 100
O2 CONTENT: 15 L/min
O2 Saturation: 88.8 %
PCO2 ART: 57.8 mmHg — AB (ref 32.0–48.0)
PO2 ART: 60.5 mmHg — AB (ref 83.0–108.0)
Patient temperature: 97.7
pH, Arterial: 7.38 (ref 7.350–7.450)

## 2017-01-16 LAB — MAGNESIUM: Magnesium: 2.5 mg/dL — ABNORMAL HIGH (ref 1.7–2.4)

## 2017-01-16 LAB — PROTIME-INR
INR: 1.5
PROTHROMBIN TIME: 18.3 s — AB (ref 11.4–15.2)

## 2017-01-16 LAB — PHOSPHORUS: Phosphorus: 3.4 mg/dL (ref 2.5–4.6)

## 2017-01-16 MED ORDER — DEXTROSE 5 % IV SOLN
INTRAVENOUS | Status: DC
  Administered 2017-01-16: 11:00:00 via INTRAVENOUS

## 2017-01-16 MED ORDER — HALOPERIDOL LACTATE 5 MG/ML IJ SOLN
1.0000 mg | Freq: Four times a day (QID) | INTRAMUSCULAR | Status: DC
  Administered 2017-01-16 – 2017-01-17 (×4): 1 mg via INTRAVENOUS
  Filled 2017-01-16: qty 0.2
  Filled 2017-01-16: qty 1
  Filled 2017-01-16: qty 0.2
  Filled 2017-01-16: qty 1
  Filled 2017-01-16 (×2): qty 0.2
  Filled 2017-01-16: qty 1

## 2017-01-16 MED ORDER — HALOPERIDOL LACTATE 5 MG/ML IJ SOLN
5.0000 mg | INTRAMUSCULAR | Status: AC
  Administered 2017-01-16: 5 mg via INTRAVENOUS
  Filled 2017-01-16: qty 1

## 2017-01-16 MED ORDER — SODIUM CHLORIDE 0.9 % IV SOLN
0.4000 ug/kg/h | INTRAVENOUS | Status: DC
  Administered 2017-01-16: 0.4 ug/kg/h via INTRAVENOUS
  Filled 2017-01-16 (×2): qty 2

## 2017-01-16 MED ORDER — DIGOXIN 125 MCG PO TABS
0.1250 mg | ORAL_TABLET | Freq: Every day | ORAL | Status: DC
  Filled 2017-01-16: qty 1

## 2017-01-16 MED ORDER — HALOPERIDOL LACTATE 5 MG/ML IJ SOLN
5.0000 mg | Freq: Four times a day (QID) | INTRAMUSCULAR | Status: DC
  Filled 2017-01-16: qty 1

## 2017-01-16 MED ORDER — SODIUM POLYSTYRENE SULFONATE 15 GM/60ML PO SUSP
30.0000 g | Freq: Once | ORAL | Status: DC
  Filled 2017-01-16: qty 120

## 2017-01-16 MED ORDER — ORAL CARE MOUTH RINSE
15.0000 mL | Freq: Two times a day (BID) | OROMUCOSAL | Status: DC
  Administered 2017-01-16 – 2017-01-17 (×3): 15 mL via OROMUCOSAL

## 2017-01-16 MED ORDER — DEXMEDETOMIDINE HCL IN NACL 400 MCG/100ML IV SOLN
0.4000 ug/kg/h | INTRAVENOUS | Status: DC
  Administered 2017-01-16: 1.2 ug/kg/h via INTRAVENOUS
  Filled 2017-01-16: qty 100

## 2017-01-16 MED ORDER — MORPHINE SULFATE (PF) 2 MG/ML IV SOLN
1.0000 mg | INTRAVENOUS | Status: DC | PRN
  Administered 2017-01-16 – 2017-01-17 (×8): 2 mg via INTRAVENOUS
  Filled 2017-01-16 (×8): qty 1

## 2017-01-16 MED ORDER — METHYLPREDNISOLONE SODIUM SUCC 125 MG IJ SOLR
60.0000 mg | Freq: Two times a day (BID) | INTRAMUSCULAR | Status: DC
  Administered 2017-01-16 – 2017-01-17 (×3): 60 mg via INTRAVENOUS
  Filled 2017-01-16: qty 2
  Filled 2017-01-16: qty 0.96
  Filled 2017-01-16: qty 2
  Filled 2017-01-16: qty 0.96

## 2017-01-16 NOTE — Progress Notes (Signed)
MD sat goal 88% and above. RT titrated pt back to the [place] HFNC 15L, sat 91%.  Tolerating well at this time.

## 2017-01-16 NOTE — Progress Notes (Signed)
Pt placed on Bipap per order.

## 2017-01-16 NOTE — Progress Notes (Signed)
ANTICOAGULATION CONSULT NOTE  Pharmacy Consult for Heparin Indication: atrial fibrillation  No Known Allergies  Patient Measurements: Height: 5\' 9"  (175.3 cm) Weight: 130 lb 15.3 oz (59.4 kg) IBW/kg (Calculated) : 70.7 Heparin Dosing Weight: 60.6 kg  Vital Signs: Temp: 97 F (36.1 C) (03/30 1555) Temp Source: Oral (03/30 1555) BP: 79/49 (03/30 1900) Pulse Rate: 93 (03/30 1900)  Labs:  Recent Labs  01/14/17 0355 01/15/17 0256  01/16/17 0325 01/16/17 1200 01/16/17 2155  HGB 12.6* 12.4*  --  12.4*  --   --   HCT 38.4* 38.3*  --  38.8*  --   --   PLT 153 125*  --  132*  --   --   LABPROT 28.7* 20.1*  --  18.3*  --   --   INR 2.63 1.69  --  1.50  --   --   HEPARINUNFRC  --   --   < > 0.15* 0.25* 0.61  CREATININE 1.30* 0.97  --  0.87  --   --   < > = values in this interval not displayed.  Estimated Creatinine Clearance: 47.4 mL/min (by C-G formula based on SCr of 0.87 mg/dL).   Assessment: 83 y/o male with history of Afib, Coumadin on hold, for heparin   Goal of Therapy:  Heparin level 0.3-0.7 units/ml Monitor platelets by anticoagulation protocol: Yes   Plan:  Continue Heparin at current rate Follow-up am labs.   Phillis Knack, PharmD, BCPS  01/16/2017 10:33 PM

## 2017-01-16 NOTE — Progress Notes (Signed)
ANTICOAGULATION CONSULT NOTE  Pharmacy Consult for Heparin Indication: atrial fibrillation  No Known Allergies  Patient Measurements: Height: 5\' 9"  (175.3 cm) Weight: 130 lb 15.3 oz (59.4 kg) IBW/kg (Calculated) : 70.7 Heparin Dosing Weight: 60.6 kg  Vital Signs: Temp: 97.9 F (36.6 C) (03/30 1131) Temp Source: Oral (03/30 1131) BP: 97/65 (03/30 1300) Pulse Rate: 85 (03/30 1300)  Labs:  Recent Labs  01/14/17 0355 01/15/17 0256 01/15/17 1816 01/16/17 0325 01/16/17 1200  HGB 12.6* 12.4*  --  12.4*  --   HCT 38.4* 38.3*  --  38.8*  --   PLT 153 125*  --  132*  --   LABPROT 28.7* 20.1*  --  18.3*  --   INR 2.63 1.69  --  1.50  --   HEPARINUNFRC  --   --  0.10* 0.15* 0.25*  CREATININE 1.30* 0.97  --  0.87  --     Estimated Creatinine Clearance: 47.4 mL/min (by C-G formula based on SCr of 0.87 mg/dL).    Medications:  Infusions:  . dextrose 50 mL/hr at 01/16/17 1052  . heparin 1,300 Units/hr (01/16/17 0515)    Assessment: 83 y/o male with history of Afib on warfarin PTA. INR remained elevated for several days without warfarin, up to INR 4.5. Now that INR < 2, pt was started on heparin last night. Heparin level this morning is SUBtherapeutic. hgb 12.5, plts wnl.   Goal of Therapy:  Heparin level 0.3-0.7 units/ml Monitor platelets by anticoagulation protocol: Yes   Plan:  - Increase heparin to 1450 units/hr - Daily heparin level and CBC - Check level this evening     Hughes Better, PharmD, BCPS Clinical Pharmacist 01/16/2017 2:22 PM

## 2017-01-16 NOTE — Progress Notes (Signed)
PULMONARY / CRITICAL CARE MEDICINE   Name: Logan [name] MRN: [id number] DOB: 05-Aug-1925    ADMISSION DATE:  12/29/2016  HISTORY OF PRESENT ILLNESS:   83yo male with very severe COPD with emphysema (2L home O2), Afib (on metoprolol and warfarin), HFpEF, Pulmonary HTN (WHO 3), who presented 3/25  BIBA after 1 day of SOB, DOE and increased respiratory distress.    In the ED he was somnolent and minimally responsive with acute resp acidosis, intubated with peri-intubation hypotension requiring pressors   Blood cx 3/25 >>strep pneumonia U strep ag pos  ETT 3/25 >>3/29 RUE PICC 3/26 >>  EVENTS 3/29  Critically ill, intubated off pressors tachy UO improved Afebrile, denies pain     SUBJECTIVE/OVERNIGHT/INTERVAL HX 3/30 - sudden agitated delirium this am and responded somewhat to haldol 5mg  stat and precedex gtt. Increased wob with hypecarbia and hypoxemia. Needing restraints. He is a DNI  VITAL SIGNS: Temp:  [97.7 F (36.5 C)-100.6 F (38.1 C)] 97.7 F (36.5 C) (03/30 0751) Pulse Rate:  [100-136] 111 (03/30 0803) Resp:  [19-31] 25 (03/30 0803) BP: (106-139)/(66-109) 117/69 (03/30 0803) SpO2:  [84 %-94 %] 94 % (03/30 0803) FiO2 (%):  [40 %] 40 % (03/29 1000) Weight:  [59.4 kg (130 lb 15.3 oz)] 59.4 kg (130 lb 15.3 oz) (03/30 0500) HEMODYNAMICS:   VENTILATOR SETTINGS: FiO2 (%):  [40 %] 40 % INTAKE / OUTPUT:  Intake/Output Summary (Last 24 hours) at 01/16/17 3428 Last data filed at 01/16/17 0800  Gross per 24 hour  Intake           515.57 ml  Output             1235 ml  Net          -719.43 ml    PHYSICAL EXAMINATION:  General Appearance:    Looks frail and increased wb  Head:    Normocephalic, without obvious abnormality, atraumatic  Eyes:    PERRL - yes, conjunctiva/corneas - clear      Ears:    Normal external ear canals, both ears  Nose:   NG tube - no  Throat:  ETT TUBE - no but has bipap on now , OG tube - no  Neck:   Supple,  No  enlargement/tenderness/nodules     Lungs:     Moderate distress with mild paradoxical   Chest wall:    No deformity  Heart:    S1 and S2 normal, no murmur, CVP - no.  Pressors - no  Abdomen:     Soft, no masses, no organomegaly  Genitalia:    Not done  Rectal:   not done  Extremities:   Extremities- no cyanosis, no clubbing      Skin:   Intact in exposed areas      Neurologic:   Sedation - none -> RASS - Was +5 and then -1 to +2 after preceddx and haldol . Moves all 4s - yes. CAM-ICU - posiotve for delirium . Orientation - not oriented       LABS: PULMONARY  Recent Labs Lab 12/19/2016 1902  01/04/2017 1959 12/22/2016 2345 01/12/17 0103 01/12/17 0327 01/15/17 0930 01/16/17 0913  PHART  --   < > 7.196* 7.186* 7.210* 7.314* 7.396 7.380  PCO2ART  --   < > 87.8* 78.1* 81.3* 62.0* 55.3* 57.8*  PO2ART  --   < > 109.0* 77.0* 80.0* 70.0* 68.9* 60.5*  HCO3  --   < > 34.1* 29.5* 32.5* 31.5* 33.4* 33.7*  TCO2 34  --  37 32 35 33  --   --   O2SAT  --   < > 96.0 90.0 92.0 91.0 92.2 88.8  < > = values in this interval not displayed.  CBC  Recent Labs Lab 01/14/17 0355 01/15/17 0256 01/16/17 0325  HGB 12.6* 12.4* 12.4*  HCT 38.4* 38.3* 38.8*  WBC 15.1* 13.1* 11.4*  PLT 153 125* 132*    COAGULATION  Recent Labs Lab 01/12/17 0830 01/13/17 0405 01/14/17 0355 01/15/17 0256 01/16/17 0325  INR 3.61 4.51* 2.63 1.69 1.50    CARDIAC   Recent Labs Lab 01/12/17 0514 01/12/17 0830 01/12/17 1726  TROPONINI 0.06* 0.07* 0.07*   No results for input(s): PROBNP in the last 168 hours.   CHEMISTRY  Recent Labs Lab 01/12/17 0514  01/12/17 1726 01/13/17 0405 01/14/17 0355 01/15/17 0256 01/16/17 0325  NA 145  --   --  142 144 147* 151*  K 3.7  --   --  4.2 4.7 4.7 5.2*  CL 105  --   --  105 106 107 108  CO2 30  --   --  29 30 34* 35*  GLUCOSE 133*  --   --  182* 165* 180* 129*  BUN 25*  --   --  48* 68* 60* 52*  CREATININE 1.17  --   --  1.37* 1.30* 0.97 0.87  CALCIUM  8.5*  --   --  8.8* 8.7* 8.6* 8.8*  MG  --   < > 2.1 2.3 2.3 2.5* 2.5*  PHOS 2.8  < > 2.9 1.9* 2.4* 2.0* 3.4  < > = values in this interval not displayed. Estimated Creatinine Clearance: 47.4 mL/min (by C-G formula based on SCr of 0.87 mg/dL).   LIVER  Recent Labs Lab 12/20/2016 1846 01/12/17 0514 01/12/17 0830 01/13/17 0405 01/14/17 0355 01/15/17 0256 01/16/17 0325  AST 39  --   --   --   --   --   --   ALT 30  --   --   --   --   --   --   ALKPHOS 65  --   --   --   --   --   --   BILITOT 0.9  --   --   --   --   --   --   PROT 7.1  --   --   --   --   --   --   ALBUMIN 3.4* 2.6*  --   --   --   --   --   INR 2.38  --  3.61 4.51* 2.63 1.69 1.50     INFECTIOUS  Recent Labs Lab 12/22/2016 1902 01/12/2017 2143 01/12/17 0023  LATICACIDVEN 5.05* 5.0* 4.1*     ENDOCRINE CBG (last 3)   Recent Labs  01/15/17 2334 01/16/17 0323 01/16/17 0749  GLUCAP 114* 116* 127*         IMAGING x48h  - image(s) personally visualized  -   highlighted in bold Dg Chest Port 1 View  Result Date: 01/16/2017 CLINICAL DATA:  Acute respiratory failure EXAM: PORTABLE CHEST 1 VIEW COMPARISON:  01/15/2017 FINDINGS: Cardiac shadow is within normal limits. Right-sided PICC line is again seen and stable. Bilateral pleural effusions are again identified and stable. Underlying infiltrate is noted as well also stable from the prior exam. No bony abnormality is seen. IMPRESSION: No change in bibasilar effusions and infiltrates. Electronically Signed   By: Elta Guadeloupe  Lukens M.D.   On: 01/16/2017 07:31   Dg Chest Port 1 View  Result Date: 01/15/2017 CLINICAL DATA:  Acute respiratory failure EXAM: PORTABLE CHEST 1 VIEW COMPARISON:  01/14/2017 FINDINGS: Cardiac shadow is stable. Endotracheal tube and nasogastric catheter is well as a right-sided PICC line are again seen and stable. Patchy infiltrates and effusions are again identified bilaterally. Given some variation in the imaging technique the overall  appearance is stable. IMPRESSION: Stable bilateral infiltrates and effusions. Electronically Signed   By: Inez Catalina M.D.   On: 01/15/2017 07:33      ASSESSMENT / PLAN: 83 yo CM with very severe COPD admitted with acute on chronic hypercapnic and hypoxemic respiratory failure 2/2 AECOPD 2/2 pneumococcal BLL CAP.  PULMONARY  A: Acute on chronic hypercapneic respiratory failure AECOPD Pneumococcal CAP  s/p extubation 3/29 but 3/30 with delirium and hypercarbia on brink of reintubation but he is DNI  P:   - restart BiPAP , NO REINTUBATION - Duonebs Q6h scheduled - Solumedrol 60mg  Q12h - restart 3/30 - Pulmicort nebulized BID - Holding brovana given AFib  CARDIOVASCULAR  A:  Septic shock HFpEF - decompensated Pulmonary HTN (WHO 3) Afib, chronic - now rate controlled NSVT   - off pressors as of 3/29 P:  metoprolol q3h prn for HR >120 - Holding warfarin for now, start heparin now that INR < 2.0 - ct digoxin- pharmacy  dosing ; check level   RENAL A:   AKI (baseline 0.95) now Scr 1.44 - 2/2 shock  - 3/30 - Mild high K and hypernatremia P:   - start free water  - kayexaalate x 1 - resolved - Trend Scr & lytes   GASTROINTESTINAL A:   Protein calorie malnutrition P:   - Stress ulcer ppx -hold TFs for extuation and back on bipap  HEMATOLOGIC A:   Leukocytosis ? steroids  P:  follow  INFECTIOUS A:   -Septic shock - likely CAP  P:    Abx: CTX 3/26 >> Will need 14 ds   ENDOCRINE A:    Hyperglycemia   P:   - SSI - Goal BG <180  NEUROLOGIC A:   Agitated delirium + P:   Holadol with qtc monitoring precedex gtt RASS goal: 0 - intermittent fentanyl  Code status: DNR, no reintubation clarified 01/15/17     FAMILY  - Updates:  wife at bedside 3/26 Palliative discussion 3/27 , one way extubation   GLOBAL 3/30 - if declines, then transition to comfort     The patient is critically ill with multiple organ systems failure and  requires high complexity decision making for assessment and support, frequent evaluation and titration of therapies, application of advanced monitoring technologies and extensive interpretation of multiple databases.   Critical Care Time devoted to patient care services described in this note is  30  Minutes. This time reflects time of care of this signee Dr Brand Males. This critical care time does not reflect procedure time, or teaching time or supervisory time of PA/NP/Med student/Med Resident etc but could involve care discussion time    Dr. Brand Males, M.D., [facility].C.P Pulmonary and Critical Care Medicine Staff Physician [place] [facility] Pager: [phone], If no answer or between  15:00h - 7:00h: call [phone]  01/16/2017 9:34 AM

## 2017-01-16 NOTE — Progress Notes (Signed)
This note also relates to the following rows which could not be included: SpO2 - Cannot attach notes to unvalidated device data  Pt did not tolerate Bipap.  He pulled it from his face repeatedly and stated he did not want to wear it.  RT placed pt back on NRB 15L, 100%.  Pt resting more comfortably and leaving the NRB mask on at this time.  RN notified.

## 2017-01-16 NOTE — Progress Notes (Signed)
ANTICOAGULATION CONSULT NOTE  Pharmacy Consult for Heparin Indication: atrial fibrillation  No Known Allergies  Patient Measurements: Height: 5\' 9"  (175.3 cm) Weight: 133 lb 9.6 oz (60.6 kg) IBW/kg (Calculated) : 70.7 Heparin Dosing Weight: 60.6 kg  Vital Signs: Temp: 97.7 F (36.5 C) (03/30 0321) Temp Source: Oral (03/30 0321) BP: 128/84 (03/30 0200) Pulse Rate: 115 (03/30 0200)  Labs:  Recent Labs  01/13/17 0405 01/14/17 0355 01/15/17 0256 01/15/17 1816 01/16/17 0325  HGB 13.7 12.6* 12.4*  --  12.4*  HCT 42.5 38.4* 38.3*  --  38.8*  PLT 196 153 125*  --  132*  LABPROT 44.1* 28.7* 20.1*  --  18.3*  INR 4.51* 2.63 1.69  --  1.50  HEPARINUNFRC  --   --   --  0.10* 0.15*  CREATININE 1.37* 1.30* 0.97  --   --     Estimated Creatinine Clearance: 43.4 mL/min (by C-G formula based on SCr of 0.97 mg/dL).   Assessment: 83 y/o male with history of Afib, Coumadin on hold, for heparin   Goal of Therapy:  Heparin level 0.3-0.7 units/ml Monitor platelets by anticoagulation protocol: Yes   Plan:  Increase Heparin  1300 units/hr Check heparin level in 8 hours.  Phillis Knack, PharmD, BCPS  01/16/2017 3:57 AM

## 2017-01-16 NOTE — Progress Notes (Signed)
PT Cancellation Note  Patient Details Name: Logan [name] MRN: [id number] DOB: June 23, 1925   Cancelled Treatment:    Reason Eval/Treat Not Completed: Other (comment) (pt lethargic, received haldol, in restraints after trying to climb out of bed. RN deferred eval at this time)   Koy Lamp B [name] 01/16/2017, 8:11 AM  Elwyn Reach, [place]

## 2017-01-16 NOTE — Progress Notes (Signed)
Palliative care at the bedside

## 2017-01-16 NOTE — Progress Notes (Signed)
   01/16/17 1720  Clinical Encounter Type  Visited With Patient and family together  Visit Type Other (Comment) ([place] consult)  Spiritual Encounters  Spiritual Needs Emotional  Stress Factors  Patient Stress Factors Major life changes  Family Stress Factors Family relationships  Introduction to family as Pt intubated. Wife appears to be coping but this Probation officer concurs with Palliative note of denial with wife. Asked to be paged should services be required.

## 2017-01-16 NOTE — Progress Notes (Signed)
Palliative Care  Follow-up Note Reason for Visit: Symptom Management, Goals of Care Requested by: CCM  Logan [name] was extubated yesterday after being medically maximized for a severe COPD exacerbation with PNA. Despite his advanced age, his wife who is 81yo, reports he was highly functional prior to admission- they did shopping together and frequently went out to dinner. He is a patient of Dr. Melvyn Novas in the [facility], followed for advanced COPD, home O2 dependent. Admitted with AMS, Respiratory failure and PNA, intubated in the ED.   After extubation he did relatively well, but as yesterday evening went on he became more dyspneic, exhausted from WOB and became agitated and confused. CCM started precedex and gave him a relatively large dose of IV haldol. On my visit he is deeply sedated, on 100% NRB, cannot be awakened with verbal or gentle physical stimulation. I suspect this is medication driven, possibly rising CO2 contributing.  I spoke at length with his wife and a niece (they did not have children) about how serious his condition was-I certainly prepared them for this possibility on my initial consult visit on 3/27 (see note). We discussed options moving forward. His wife has overall poor insight into his condition- I am not sure if this is low health literacy or denial-she told me she didn't know he had COPD and what that meant or how bad it was-he was on home oxygen. She knows he is very likely dying. We agreed that we did not want him to suffer and that comfort should be our first priority followed my gentle non-invasive medical treatment. She continues to "hope and pray" for his recovery, but is beginning to think about him not surviving the hospitalization.    Recommendations:  1. Dyspnea, Distress, Agitation: I think this is primarily driven by air hunger, exhaustion from WOB and poor sleep in the ICU environment, infection, and hypoxia/hypercarbia. He is deeply sedated-I think we need to  give him a chance to clear the Haldol and the precedex and I have discussed using morphine for primary dyspnea-will start low and give small frequency parameters-discussed dosing with the RN. Can tolerate lower sats in general-85%-100%. Support with standard COPD treatment measures, nebulizer's, steroid and antibiotics for PNA. I reduced the dose of haldol.  2. Treatment Preferences and Interventions  Avoid excessive or unnecessary labs, I canceled ordered labs- I am not sure they will ad much to his existing care.  I cancelled the Kayexalate enema- K was only 5.2 and this would cause unecessary discomfort.  Continue IV antibiotics, Prednisone and other essential medications (metoprolol, digoxin to control Afib rate)  I stopped levothyroxine, stopped q4 CBGs, cut fluids back to [facility] as to not volume overload him  3. Care Transitions:  Would like to work on getting him out of the ICU but he is currently requiring very close attention to symptom management of his respiratory failure and O2 delivery and titration is being done.  He is extremely deconditioned-I do not believe he will survive this hospitalization- but if he recovers his mental state there will be difficult decisions to make and his overall prognosis is very poor.  Our team will continue to follow- hopefully we can get him to the floor soon. Requested Chaplain see his wife for support.   Logan Hacker, DO Palliative Medicine [phone]

## 2017-01-16 NOTE — Progress Notes (Signed)
This note also relates to the following rows which could not be included: BP - Cannot attach notes to unvalidated device data  Pt placed back on NRB 100% due to desat.

## 2017-01-17 DIAGNOSIS — J189 Pneumonia, unspecified organism: Secondary | ICD-10-CM

## 2017-01-17 DIAGNOSIS — Z515 Encounter for palliative care: Secondary | ICD-10-CM

## 2017-01-17 LAB — CBC
HEMATOCRIT: 44.3 % (ref 39.0–52.0)
HEMOGLOBIN: 13.5 g/dL (ref 13.0–17.0)
MCH: 31.8 pg (ref 26.0–34.0)
MCHC: 30.5 g/dL (ref 30.0–36.0)
MCV: 104.5 fL — ABNORMAL HIGH (ref 78.0–100.0)
Platelets: 136 10*3/uL — ABNORMAL LOW (ref 150–400)
RBC: 4.24 MIL/uL (ref 4.22–5.81)
RDW: 16.1 % — AB (ref 11.5–15.5)
WBC: 6.8 10*3/uL (ref 4.0–10.5)

## 2017-01-17 LAB — HEPARIN LEVEL (UNFRACTIONATED): Heparin Unfractionated: 0.68 IU/mL (ref 0.30–0.70)

## 2017-01-17 MED ORDER — GLYCOPYRROLATE 0.2 MG/ML IJ SOLN: 0.2000 mg | INTRAMUSCULAR | Status: DC | PRN

## 2017-01-17 MED ORDER — LORAZEPAM 2 MG/ML IJ SOLN: 0.5000 mg | INTRAMUSCULAR | Status: DC | PRN

## 2017-01-17 MED ORDER — SODIUM CHLORIDE 0.9 % IV SOLN
1.0000 mg/h | INTRAVENOUS | Status: DC
  Administered 2017-01-17: 1 mg/h via INTRAVENOUS
  Filled 2017-01-17: qty 10

## 2017-01-17 MED ORDER — HALOPERIDOL LACTATE 5 MG/ML IJ SOLN: 1.0000 mg | Freq: Four times a day (QID) | INTRAMUSCULAR | Status: DC | PRN

## 2017-01-17 MED ORDER — MORPHINE SULFATE (PF) 2 MG/ML IV SOLN
2.0000 mg | INTRAVENOUS | Status: DC | PRN
  Administered 2017-01-17: 2 mg via INTRAVENOUS
  Filled 2017-01-17: qty 1

## 2017-01-17 MED ORDER — MORPHINE BOLUS VIA INFUSION
2.0000 mg | INTRAVENOUS | Status: DC | PRN
  Filled 2017-01-17: qty 2

## 2017-01-18 NOTE — Progress Notes (Signed)
Called back [employer] and informed this RN that Dr. Levada Schilling got my message - passing away of patient, but unfortunately Dr. Levada Schilling shift ended at 11pm.  Per Dennie Maizes, the last PCCM MD, Dr. Clide Dales who had seen the patient will be signing the death certificate. Will let [employer] made aware of this.

## 2017-01-18 NOTE — Progress Notes (Addendum)
Patient passed at 2220 with Charge RN Barbaraann Rondo confirming.  Called spouse and on-call Elink MD and made them aware of patient's passing.  Wife will call her niece since no funeral parlor is decided yet.  Niece will call back this RN.  Wasted about 241 mL of morphine 250 mg in NS 250 mL bag in sink with Krista Blue, RN. Still awaiting for returned call.

## 2017-01-18 NOTE — Progress Notes (Addendum)
PT Cancellation Note/Sign off Patient Details Name: Logan [name] MRN: [id number] DOB: 10-30-1924   Cancelled Treatment:    Reason Eval/Treat Not Completed: Medical issues which prohibited therapy (Pt hypotensive.  Possible comfort care. Sign off. Nsg agrees)Please reorder if pt status changes. Thanks.   Denice Paradise 2017-01-21, 10:53 AM  [facility] [phone] (pager)

## 2017-01-18 NOTE — Progress Notes (Signed)
Patient's niece, Tessie Fass called back and provided [employer] in [place] as the funeral parlor and will wait for funeral parlor to call them.  [facility] for referral and CDS cleared body for release to morgue.  Still awaiting for on-call E-link MD's call.

## 2017-01-18 NOTE — Progress Notes (Signed)
ANTICOAGULATION CONSULT NOTE  Pharmacy Consult for Heparin Indication: atrial fibrillation  No Known Allergies  Patient Measurements: Height: 5\' 9"  (175.3 cm) Weight: 130 lb 11.7 oz (59.3 kg) IBW/kg (Calculated) : 70.7 Heparin Dosing Weight: 60.6 kg  Vital Signs: Temp: 97.7 F (36.5 C) (03/31 0745) Temp Source: Oral (03/31 0745) BP: 79/68 (03/31 0759) Pulse Rate: 125 (03/31 0759)  Labs:  Recent Labs  01/15/17 0256  01/16/17 0325 01/16/17 1200 01/16/17 2155 02-03-17 0434 02-03-17 0435  HGB 12.4*  --  12.4*  --   --  13.5  --   HCT 38.3*  --  38.8*  --   --  44.3  --   PLT 125*  --  132*  --   --  136*  --   LABPROT 20.1*  --  18.3*  --   --   --   --   INR 1.69  --  1.50  --   --   --   --   HEPARINUNFRC  --   < > 0.15* 0.25* 0.61  --  0.68  CREATININE 0.97  --  0.87  --   --   --   --   < > = values in this interval not displayed.  Estimated Creatinine Clearance: 47.3 mL/min (by C-G formula based on SCr of 0.87 mg/dL).    Medications:  Infusions:  . dextrose 10 mL/hr at 01/16/17 1446  . heparin 1,450 Units/hr (02/03/17 0034)    Assessment: 83 y/o male with history of Afib on warfarin PTA. INR remained elevated for several days without warfarin, up to INR 4.5. Pt was started on heparin when INR fell <2. Heparin level this morning is therapeutic, near high end of reference range, will reduce rate just a little. hgb 13.5, plts 136.   Goal of Therapy:  Heparin level 0.3-0.7 units/ml Monitor platelets by anticoagulation protocol: Yes    Plan:  -Reduce heparin to 1400 units/hr -Daily HL, CBC    Hughes Better, PharmD, BCPS Clinical Pharmacist 02/03/17 10:02 AM

## 2017-01-18 NOTE — Progress Notes (Addendum)
Daily Progress Note   Patient Name: Logan [name]       Date: Feb 03, 2017 DOB: Mar 02, 1925  Age: 83 y.o. MRN#: [id number] Attending Physician: Rigoberto Noel, MD Primary Care Physician: Leonard Downing, MD Admit Date: 01/13/2017  Reason for Consultation/Follow-up: Non pain symptom management, Pain control and Psychosocial/spiritual support  Subjective: Pt is alert. No agitation over night. Able to answer simple yes no questions. Has used 16 mg of MS04 at '2mg'$  each dose for air hunger. No family at bedside. Chart reviewed  Length of Stay: 6  Current Medications: Scheduled Meds:  . budesonide (PULMICORT) nebulizer solution  0.25 mg Nebulization BID  . cefTRIAXone (ROCEPHIN)  IV  2 g Intravenous QHS  . Chlorhexidine Gluconate Cloth  6 each Topical Daily  . digoxin  0.125 mg Oral Daily  . haloperidol lactate  1 mg Intravenous Q6H  . ipratropium-albuterol  3 mL Nebulization Q6H  . mouth rinse  15 mL Mouth Rinse BID  . methylPREDNISolone (SOLU-MEDROL) injection  60 mg Intravenous BID  . mirtazapine  7.5 mg Oral QHS  . sodium chloride flush  10-40 mL Intracatheter Q12H    Continuous Infusions: . dextrose 10 mL/hr at 01/16/17 1446  . heparin 1,450 Units/hr (February 03, 2017 0034)    PRN Meds: sodium chloride, bisacodyl, docusate, metoprolol, morphine injection, phenol, sodium chloride flush  Physical Exam  Constitutional:  Cachetic frail, appears acutely ill  HENT:  Head: Normocephalic and atraumatic.  Neck: Normal range of motion.  Cardiovascular:  A-fib, rate 120's  Pulmonary/Chest:  Wearing NRB; dyspnea mild  Musculoskeletal: Normal range of motion.  Neurological: He is alert.  Can answer simple yes and no questions  Skin: Skin is warm and dry. There is pallor.  Psychiatric:    No agitation  Nursing note and vitals reviewed.           Vital Signs: BP (!) 79/68   Pulse (!) 125   Temp 97.7 F (36.5 C) (Oral)   Resp 20   Ht '5\' 9"'$  (1.753 m)   Wt 59.3 kg (130 lb 11.7 oz)   SpO2 96%   BMI 19.31 kg/m  SpO2: SpO2: 96 % O2 Device: O2 Device: Partial Rebreather Mask O2 Flow Rate: O2 Flow Rate (L/min): 15 L/min  Intake/output summary:  Intake/Output Summary (Last 24 hours) at 03-Feb-2017 0911 Last  data filed at January 18, 2017 0700  Gross per 24 hour  Intake            797.7 ml  Output              820 ml  Net            -22.3 ml   LBM: Last BM Date:  (PTA) Baseline Weight: Weight: 56.7 kg (125 lb) Most recent weight: Weight: 59.3 kg (130 lb 11.7 oz)       Palliative Assessment/Data:    Flowsheet Rows     Most Recent Value  Intake Tab  Referral Department  Pulmonary  Unit at Time of Referral  ER  Palliative Care Primary Diagnosis  Pulmonary  Date Notified  01/08/2017  Palliative Care Type  New Palliative care  Reason for referral  Clarify Goals of Care  Date of Admission  01/02/2017  Date first seen by Palliative Care  01/13/17  # of days IP prior to Palliative referral  0  Clinical Assessment  Palliative Performance Scale Score  20%  Pain Max last 24 hours  Other (Comment)  Pain Min Last 24 hours  Other (Comment)  Dyspnea Max Last 24 Hours  Other (Comment)  Dyspnea Min Last 24 hours  Other (Comment)  Nausea Max Last 24 Hours  0  Nausea Min Last 24 Hours  0  Anxiety Max Last 24 Hours  Other (Comment)  Anxiety Min Last 24 Hours  Other (Comment)  Psychosocial & Spiritual Assessment  Palliative Care Outcomes      Patient Active Problem List   Diagnosis Date Noted  . Acute respiratory failure ([place])   . Acute respiratory failure with hypoxia and hypercapnia (HCC)   . Advance care planning   . Goals of care, counseling/discussion   . Palliative care by specialist   . COPD with acute exacerbation ([place]) 01/03/2017  . Chronic obstructive pulmonary  disease ([place])   . CAP (community acquired pneumonia)   . Community acquired pneumonia   . Asymptomatic PVCs 12/22/2015  . COPD (chronic obstructive pulmonary disease) ([place]) 12/21/2015  . Acute respiratory failure with hypoxia ([place]) 12/21/2015  . Pulmonary hypertension   . Hypothyroidism 11/25/2014  . Dyspnea 12/21/2013  . Bilateral pleural effusion 12/21/2013  . COPD IV due to assoc hypercarbic resp failure 11/25/2013  . Chronic respiratory failure with hypoxia and hypercapnia ([place]) 11/25/2013  . Encounter for therapeutic drug monitoring 11/18/2013  . Pneumonia 10/30/2013  . Solitary pulmonary nodule 01/14/2013  . Post-operative state 01/14/2013  . Chronic diastolic CHF (congestive heart failure) ([place]) 08/13/2012  . Acute on chronic diastolic congestive heart failure ([place]) 07/25/2012  . Chronic atrial fibrillation ([place])   . Dilated cardiomyopathy ([place])   . Hyperlipidemia   . Chronic anticoagulation   . 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    In the ED he was somnolent and minimally responsive with acute resp acidosis, intubated with peri-intubation hypotension requiring pressors. He is now extubated and off pressors but requiring NRB and still dyspnei  Recommendations/Plan:  Dyspnea: Continue PRN MS04. Will increase range to 2-3 mg q30 min PRN. Will cont PRN while in ICU. Monitor for increased needs and need for scheduled dosing and or gtt.   Pt  is not full comfort care. Giving time to see if he can improved  Agitation: Improved. Cont scheduled haldol  Addendum: Met with wife later in the morning. Pt's BP dropping. He told her he was dying. She recognizes this and is quite tearful. Agreed to comfort care and will  adjust medicine: MS04 gtt, DC abx, addition of PRN ativan, haldol and robinul; Tx to 6N for ease of visitation  Goals of Care and Additional Recommendations:  Limitations on Scope of Treatment: No Artificial Feeding, No Chemotherapy, No Radiation, No Surgical Procedures and No Tracheostomy  Code Status:    Code Status Orders        Start     Ordered   01/13/17 1345  Do not attempt resuscitation (DNR)  Continuous    Question Answer Comment  In the event of cardiac or respiratory ARREST Do not call a "code blue"   In the event of cardiac or respiratory ARREST Do not perform Intubation, CPR, defibrillation or ACLS   In the event of cardiac or respiratory ARREST Use medication by any route, position, wound care, and other measures to relive pain and suffering. May use oxygen, suction and manual treatment of airway obstruction as needed for comfort.   Comments Maintain ETT until medically ready for extubation and continue all reasonable medical treatments and interventions simulteaneously with comfort measures      01/13/17 1351    Code Status History    Date Active Date Inactive Code Status Order ID Comments User Context   01/10/2017  9:28 PM 01/13/2017  1:51 PM Full Code [id number]  Roswell Nickel, MD ED   12/21/2015 11:38 PM 12/24/2015  8:58 PM Full Code [id number]  Francesca Oman, DO ED   11/25/2014  4:04 AM 12/01/2014  3:50 PM DNR [id number]  Theressa Millard, MD ED   10/30/2013  8:17 PM 11/04/2013  6:34 PM DNR [id number]  Gerda Diss, DO ED   07/25/2012  1:04 PM 07/27/2012  4:08 PM Full Code [id number]  Verlee Monte, MD Inpatient       Prognosis:   Unable to determine but I would not be surprised if he had days  to live given his fragility, and overall co-morbidities, hypotension  Discharge Planning:  Anticipate hospital death  Care plan was discussed with bedside RN, and Dr. Harland Dingwall  Thank you for allowing the Palliative Medicine Team to assist in the care of this  patient.   Time In: 0840 1100 Time Out: [phone] Total Time 80 min  Prolonged Time Billed  yes      Greater than 50%  of this time was spent counseling and coordinating care related to the above assessment and plan.  Dory Horn, NP  Please contact Palliative Medicine Team phone at [phone] for questions and concerns.

## 2017-01-18 NOTE — Progress Notes (Signed)
Palliative care (Sarah,NP) had a meeting today with Logan [name] and his nephew regarding the future care. Family came to the decision to start him on comfort care. Received the orders, initiated on Morphine drip. Pt is comfortable, eyes open and nods head with conversation. Per family request keeping non-rebreather O2 supply for now, instead of taking everything off at one time.  Gave report to [facility] 6N, took the pt to room in bed. Tolerated well, no concerns.  Logan [name]

## 2017-01-18 NOTE — Evaluation (Signed)
Clinical/Bedside Swallow Evaluation Patient Details  Name: Logan [name] MRN: [id number] Date of Birth: 24-Jan-1925  Today's Date: 2017/02/12 Time: SLP Start Time (ACUTE ONLY): 0945 SLP Stop Time (ACUTE ONLY): 0950 SLP Time Calculation (min) (ACUTE ONLY): 5 min  Past Medical History:  Past Medical History:  Diagnosis Date  . Cancer ([place])    vocal cord - radiation   . CHF (congestive heart failure) (HCC)    DUE TO SYSTOLIC DYSFUNCTION  . Chronic anticoagulation   . Chronic atrial fibrillation ([place])   . COPD (chronic obstructive pulmonary disease) ([place])   . Dilated cardiomyopathy ([place])   . Hyperlipidemia   . Hypothyroidism   . PVC's (premature ventricular contractions)   . Renal calculi   . Shortness of breath    exertional   Past Surgical History:  Past Surgical History:  Procedure Laterality Date  . APPENDECTOMY    . CARDIAC CATHETERIZATION  02/26/2007   EF 35-40%  . CARDIOVASCULAR STRESS TEST  02/18/2007   EF 40%  . INGUINAL HERNIA REPAIR Right 12/28/2012   Procedure: HERNIA REPAIR INGUINAL ADULT;  Surgeon: Joyice Faster. Cornett, MD;  Location: [facility];  Service: General;  Laterality: Right;  . LITHOTRIPSY    . MICROLARYNGOSCOPY WITH CO2 LASER AND EXCISION OF VOCAL CORD LESION     15 years ago  . TONSILLECTOMY AND ADENOIDECTOMY    . TRANSTHORACIC ECHOCARDIOGRAM  08/26/2001   EF 25-30%  . US ECHOCARDIOGRAPHY  07/08/2010   EF 50-55%  . 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 Intubated 3/25-3/29, palliative on board, pt is DNI, currently on NRB. CXR 3/28 showed stable appearance of the chest consistent with bilateral pneumonia and small bilateral pleural effusions superimposed upon COPD. No history of swallowing evaluations in chart.    Assessment / Plan /  Recommendation Clinical Impression  Patient presents with severe risk for aspiration given lethargy, deconditioning, 4 day intubation, decreased respiratory status, and absent swallow reflex. RN present in room, removes partial non-rebreather mask. Pt with sats in low 90s, respiratory rate upper 20s. Nods in response to some yes/no questions, though responses inconsistent. Follows 25% of basic commands with visual demonstration. Accepts ice chip from teaspoon with moderate verbal, tactile cues for mouth opening. No oral manipulation; SLP unable to retrieve bolus from oral cavity. Pt does not initiate swallow despite verbal cues and tactile cue with dry spoon. Delayed wheezing, minimal cough response with wet breath sounds, suggestive of reduced airway protection. Recommend pt remain NPO unless made full comfort care as aspiration is highly likely given absent swallow response. SLP will follow.  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 Intubated 3/25-3/29, palliative on board, pt is DNI, currently on NRB. CXR 3/28 showed stable appearance of the chest consistent with bilateral pneumonia and small bilateral pleural effusions superimposed upon COPD. No history of swallowing  evaluations in chart.  Type of Study: Bedside Swallow Evaluation Previous Swallow Assessment: none found Diet Prior to this Study: NPO Temperature Spikes Noted: No Respiratory Status: Non-rebreather History of Recent Intubation: Yes Length of Intubations (days): 4 days Date extubated: 01/15/17 Behavior/Cognition: Lethargic/Drowsy;Requires cueing Oral Cavity Assessment: Dry Oral Care Completed by SLP: Recent completion by staff Oral Cavity - Dentition: Other (Comment) (difficult to assess given reduced opening of oral cavity) Vision: Functional for self-feeding Self-Feeding Abilities: Total assist Patient Positioning: Upright in bed Baseline Vocal Quality: Aphonic Volitional Cough: Cognitively unable to elicit Volitional Swallow: Able to elicit    Oral/Motor/Sensory Function Overall Oral Motor/Sensory Function: Other (comment) (difficult to assess; pt does not follow commands)   Ice Chips Ice chips: Impaired Presentation: Spoon Oral Phase Impairments: Poor awareness of bolus;Reduced labial seal;Reduced lingual movement/coordination Oral Phase Functional Implications: Oral holding Pharyngeal Phase Impairments: Unable to trigger swallow;Other (comments);Cough - Delayed (increased work of breathing)   Thin Liquid Thin Liquid: Not tested    Nectar Thick Nectar Thick Liquid: Not tested   Honey Thick Honey Thick Liquid: Not tested   Puree Puree: Not tested   Solid   GO   Solid: Not tested       Deneise Lever, MS CF-SLP Speech-Language Pathologist [phone]  Aliene Altes 01/19/17,10:03 AM

## 2017-01-18 NOTE — Progress Notes (Signed)
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    In the ED he was somnolent and minimally responsive with acute resp acidosis, intubated with peri-intubation hypotension requiring pressors   Blood cx 3/25 >>strep pneumonia U strep ag pos  ETT 3/25 >>3/29 RUE PICC 3/26 >>  EVENTS 3/29  Critically ill, intubated off pressors tachy UO improved Afebrile, denies pain    3/30 - sudden agitated delirium this am and responded somewhat to haldol 5mg  stat and precedex gtt. Increased wob with hypecarbia and hypoxemia. Needing restraints. He is a DNI  SUBJECTIVE/OVERNIGHT/INTERVAL HX Remains in distress on 100% nrb  VITAL SIGNS: Temp:  [96.4 F (35.8 C)-98.2 F (36.8 C)] 96.4 F (35.8 C) (03/31 1147) Pulse Rate:  [81-140] 114 (03/31 1000) Resp:  [19-28] 20 (03/31 1000) BP: (76-112)/(49-95) 78/52 (03/31 1000) SpO2:  [84 %-98 %] 95 % (03/31 1000) FiO2 (%):  [100 %] 100 % (03/31 0121) Weight:  [59.3 kg (130 lb 11.7 oz)] 59.3 kg (130 lb 11.7 oz) (03/31 0355) HEMODYNAMICS:   VENTILATOR SETTINGS: FiO2 (%):  [100 %] 100 % INTAKE / OUTPUT:  Intake/Output Summary (Last 24 hours) at 02-08-17 1158 Last data filed at 2017/02/08 0700  Gross per 24 hour  Intake           734.91 ml  Output              720 ml  Net            14.91 ml    PHYSICAL EXAMINATION:  General: awake, lethargic Neuro: not fc,, letahrgic, has pain HEENT: jvd PULM: ronchi CV: s1 s2 RRt GI: soft, cachetci Extremities: low muscle mass    LABS: PULMONARY  Recent Labs Lab 01/16/2017 1902  01/16/2017 1959 12/29/2016 2345 01/12/17 0103 01/12/17 0327 01/15/17 0930 01/16/17 0913  PHART  --   < >  7.196* 7.186* 7.210* 7.314* 7.396 7.380  PCO2ART  --   < > 87.8* 78.1* 81.3* 62.0* 55.3* 57.8*  PO2ART  --   < > 109.0* 77.0* 80.0* 70.0* 68.9* 60.5*  HCO3  --   < > 34.1* 29.5* 32.5* 31.5* 33.4* 33.7*  TCO2 34  --  37 32 35 33  --   --   O2SAT  --   < > 96.0 90.0 92.0 91.0 92.2 88.8  < > = values in this interval not displayed.  CBC  Recent Labs Lab 01/15/17 0256 01/16/17 0325 Feb 08, 2017 0434  HGB 12.4* 12.4* 13.5  HCT 38.3* 38.8* 44.3  WBC 13.1* 11.4* 6.8  PLT 125* 132* 136*    COAGULATION  Recent Labs Lab 01/12/17 0830 01/13/17 0405 01/14/17 0355 01/15/17 0256 01/16/17 0325  INR 3.61 4.51* 2.63 1.69 1.50    CARDIAC    Recent Labs Lab 01/12/17 0514 01/12/17 0830 01/12/17 1726  TROPONINI 0.06* 0.07* 0.07*   No results for input(s): PROBNP in the last 168 hours.   CHEMISTRY  Recent Labs Lab 01/12/17 0514  01/12/17 1726 01/13/17 0405 01/14/17 0355 01/15/17 0256 01/16/17 0325  NA 145  --   --  142 144 147* 151*  K 3.7  --   --  4.2 4.7 4.7 5.2*  CL 105  --   --  105 106 107 108  CO2 30  --   --  29 30 34* 35*  GLUCOSE 133*  --   --  182* 165* 180* 129*  BUN 25*  --   --  48* 68* 60* 52*  CREATININE 1.17  --   --  1.37* 1.30* 0.97 0.87  CALCIUM 8.5*  --   --  8.8* 8.7* 8.6* 8.8*  MG  --   < > 2.1 2.3 2.3 2.5* 2.5*  PHOS 2.8  < > 2.9 1.9* 2.4* 2.0* 3.4  < > = values in this interval not displayed. Estimated Creatinine Clearance: 47.3 mL/min (by C-G formula based on SCr of 0.87 mg/dL).   LIVER  Recent Labs Lab 12/29/2016 1846 01/12/17 0514 01/12/17 0830 01/13/17 0405 01/14/17 0355 01/15/17 0256 01/16/17 0325  AST 39  --   --   --   --   --   --   ALT 30  --   --   --   --   --   --   ALKPHOS 65  --   --   --   --   --   --   BILITOT 0.9  --   --   --   --   --   --   PROT 7.1  --   --   --   --   --   --   ALBUMIN 3.4* 2.6*  --   --   --   --   --   INR 2.38  --  3.61 4.51* 2.63 1.69 1.50     INFECTIOUS  Recent Labs Lab  01/14/2017 1902 01/01/2017 2143 01/12/17 0023  LATICACIDVEN 5.05* 5.0* 4.1*     ENDOCRINE CBG (last 3)   Recent Labs  01/16/17 0323 01/16/17 0749 01/16/17 1133  GLUCAP 116* 127* 127*         IMAGING x48h  - image(s) personally visualized  -   highlighted in bold Dg Chest Port 1 View  Result Date: 01/16/2017 CLINICAL DATA:  Acute respiratory failure EXAM: PORTABLE CHEST 1 VIEW COMPARISON:  01/15/2017 FINDINGS: Cardiac shadow is within normal limits. Right-sided PICC line is again seen and stable. Bilateral pleural effusions are again identified and stable. Underlying infiltrate is noted as well also stable from the prior exam. No bony abnormality is seen. IMPRESSION: No change in bibasilar effusions and infiltrates. Electronically Signed   By: Inez Catalina M.D.   On: 01/16/2017 07:31      ASSESSMENT / PLAN: 83 yo CM with very severe COPD admitted with acute on chronic hypercapnic and hypoxemic respiratory failure 2/2 AECOPD 2/2 pneumococcal BLL CAP.  PULMONARY  A: Acute on chronic hypercapneic respiratory failure AECOPD Pneumococcal CAP  s/p extubation 3/29 but 3/30 with delirium and hypercarbia on brink of reintubation but he is DNI  P:   -for comofrt care per pall care -requires morphine infusion -consider ativan  No escallation No NIMV cosnider to floor I updated wife  FAMILY  - Updates:  wife at bedside 3/26, 3/31 comfort care Palliative discussion 3/27 , one way extubation  Lavon Paganini. Titus Mould, MD, [place] Pgr: [facility]

## 2017-01-18 NOTE — Progress Notes (Signed)
Notified RN of pt temp of 96.4 F orally

## 2017-01-18 DEATH — deceased

## 2017-01-19 ENCOUNTER — Telehealth: Payer: Self-pay

## 2017-01-19 NOTE — Telephone Encounter (Signed)
On 01/19/17 I received a death certificate from [place] ([facility] [place] The death certificate is for burial. The patient is a patient of Doctor Elsworth Soho. The death certificate will be taken to Pulmonary Unit @ [facility] this pm for signature.  On 2017/01/29 I received the death certificate back from Doctor Elsworth Soho. I got the death certificate ready and called the funeral home to let them know the death certificate is ready for pickup.

## 2017-02-17 NOTE — Discharge Summary (Signed)
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    In the ED he was somnolent and minimally responsive with acute resp acidosis, intubated with peri-intubation hypotension requiring pressors   Blood cx 3/25 >>strep pneumonia U strep ag pos  ETT 3/25 >>3/29 RUE PICC 3/26 >>  EVENTS 3/29  Critically ill, intubated off pressors tachy UO improved Afebrile, denies pain    3/30 - sudden agitated delirium this am and responded somewhat to haldol 5mg  stat and precedex gtt. Increased wob with hypecarbia and hypoxemia. Needing restraints. He is a DNI  SUBJECTIVE/OVERNIGHT/INTERVAL HX Remains in distress on 100% nrb  Worsening status, family meeting , comfort care decided   Final diagnosis at time of death  1. PNA 2. Strep PNA bacteremia 3. COPD 4. Comfort  care  Logan [name]. Titus Mould, MD, [place] Pgr: [facility]
